# Patient Record
Sex: Male | Born: 1945 | Race: White | Hispanic: No | Marital: Married | Smoking: Former smoker
Health system: Southern US, Community
[De-identification: ages and names within clinical notes are randomized; demographics above are authoritative.]

## PROBLEM LIST (undated history)

## (undated) DIAGNOSIS — H9190 Unspecified hearing loss, unspecified ear: Secondary | ICD-10-CM

## (undated) DIAGNOSIS — I2510 Atherosclerotic heart disease of native coronary artery without angina pectoris: Secondary | ICD-10-CM

## (undated) DIAGNOSIS — Z8719 Personal history of other diseases of the digestive system: Secondary | ICD-10-CM

## (undated) DIAGNOSIS — I719 Aortic aneurysm of unspecified site, without rupture: Secondary | ICD-10-CM

## (undated) DIAGNOSIS — N189 Chronic kidney disease, unspecified: Secondary | ICD-10-CM

## (undated) DIAGNOSIS — K4090 Unilateral inguinal hernia, without obstruction or gangrene, not specified as recurrent: Secondary | ICD-10-CM

## (undated) DIAGNOSIS — R0600 Dyspnea, unspecified: Secondary | ICD-10-CM

## (undated) DIAGNOSIS — G629 Polyneuropathy, unspecified: Secondary | ICD-10-CM

## (undated) DIAGNOSIS — E669 Obesity, unspecified: Secondary | ICD-10-CM

## (undated) DIAGNOSIS — I10 Essential (primary) hypertension: Secondary | ICD-10-CM

## (undated) DIAGNOSIS — E119 Type 2 diabetes mellitus without complications: Secondary | ICD-10-CM

## (undated) DIAGNOSIS — E785 Hyperlipidemia, unspecified: Secondary | ICD-10-CM

## (undated) DIAGNOSIS — M1990 Unspecified osteoarthritis, unspecified site: Secondary | ICD-10-CM

## (undated) HISTORY — PX: JOINT REPLACEMENT: SHX530

## (undated) HISTORY — PX: NASAL SINUS SURGERY: SHX719

## (undated) HISTORY — DX: Essential (primary) hypertension: I10

## (undated) HISTORY — PX: TONSILLECTOMY: SUR1361

## (undated) HISTORY — DX: Obesity, unspecified: E66.9

## (undated) HISTORY — DX: Hyperlipidemia, unspecified: E78.5

## (undated) HISTORY — PX: UMBILICAL HERNIA REPAIR: SHX196

## (undated) HISTORY — PX: CORONARY ANGIOPLASTY: SHX604

## (undated) HISTORY — PX: HIATAL HERNIA REPAIR: SHX195

## (undated) HISTORY — PX: FRACTURE SURGERY: SHX138

## (undated) HISTORY — DX: Type 2 diabetes mellitus without complications: E11.9

---

## 2006-04-22 ENCOUNTER — Ambulatory Visit: Payer: Self-pay | Admitting: Gastroenterology

## 2007-12-31 ENCOUNTER — Ambulatory Visit: Payer: Self-pay

## 2008-09-04 IMAGING — CR DG KNEE COMPLETE 4+V*R*
1 series · 5 of 5 positions shown · non-contrast
Comparison: none

COMMENTS:

PROCEDURE:     DXR - DXR KNEE RT COMP WITH OBLIQUES  - December 31, 2007  [DATE]
RESULT:     Multiple views of the RIGHT knee reveal beaking of the tibial
spines. There are osteophytes noted from the medial margin of the RIGHT
tibial plateau. There are osteophytes noted from the superior and inferior
margins of the patella. There is no evidence of fracture and I see no joint
effusion.

[Series 1: view not recorded · 0.17mm/px · 5 of 5 slices shown]
[im 1/5]
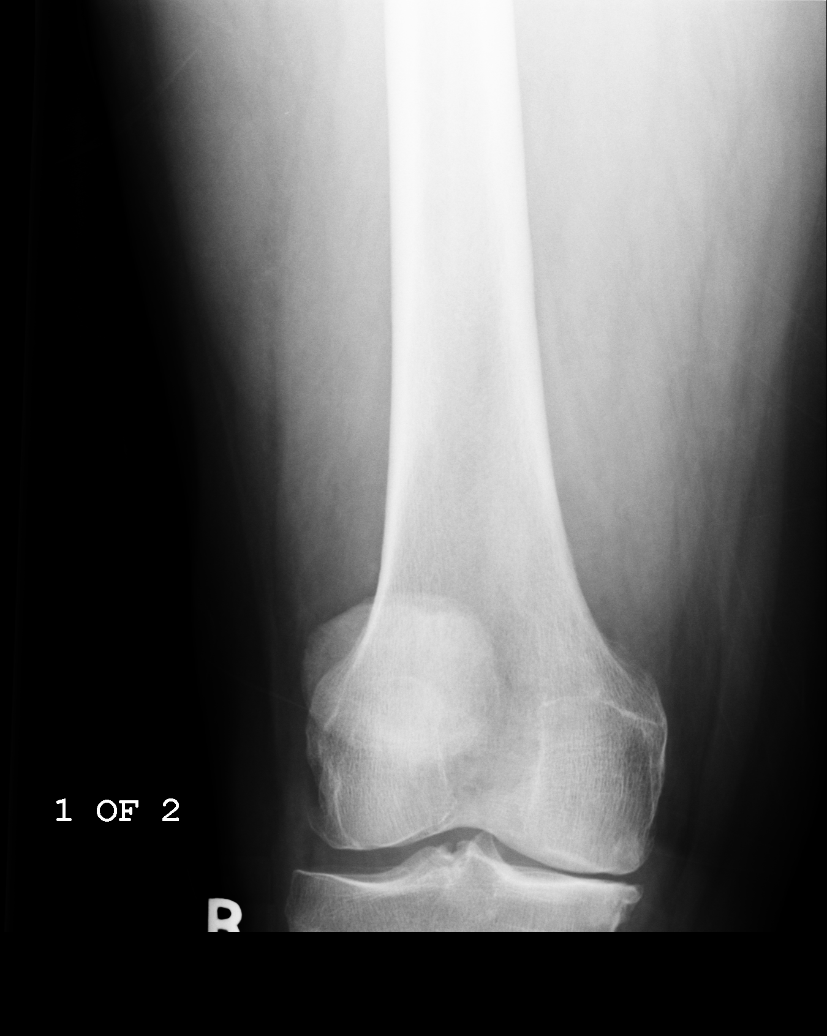
[im 2/5]
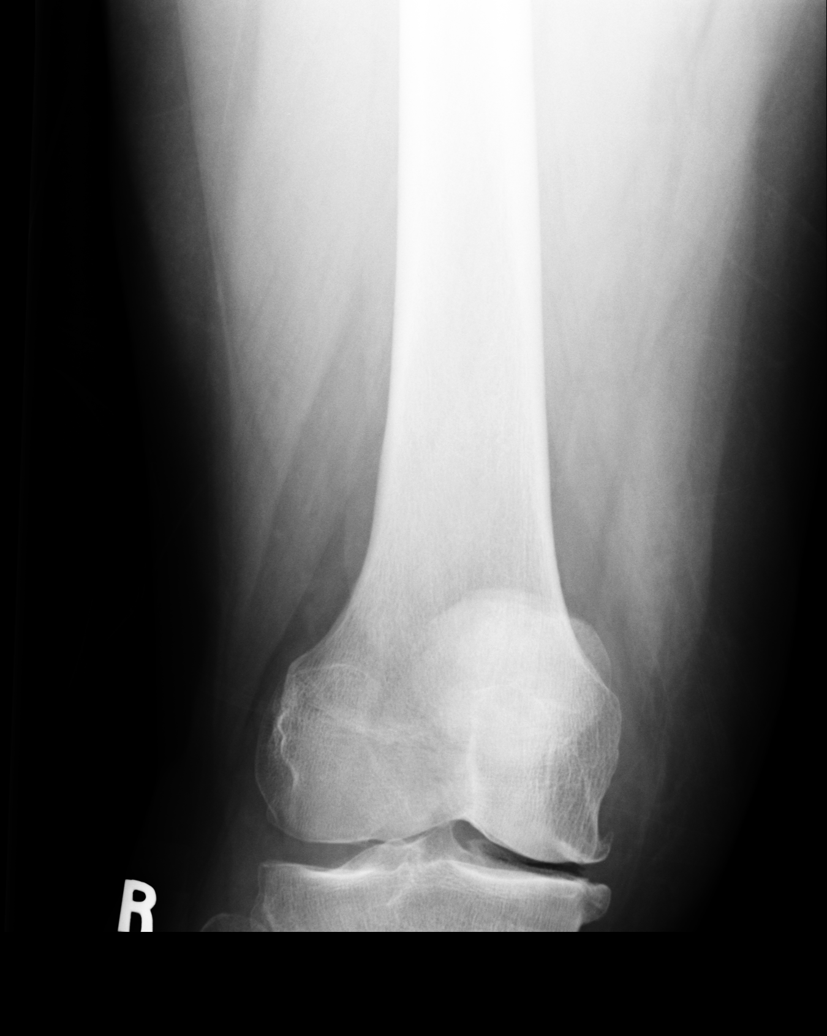
[im 3/5]
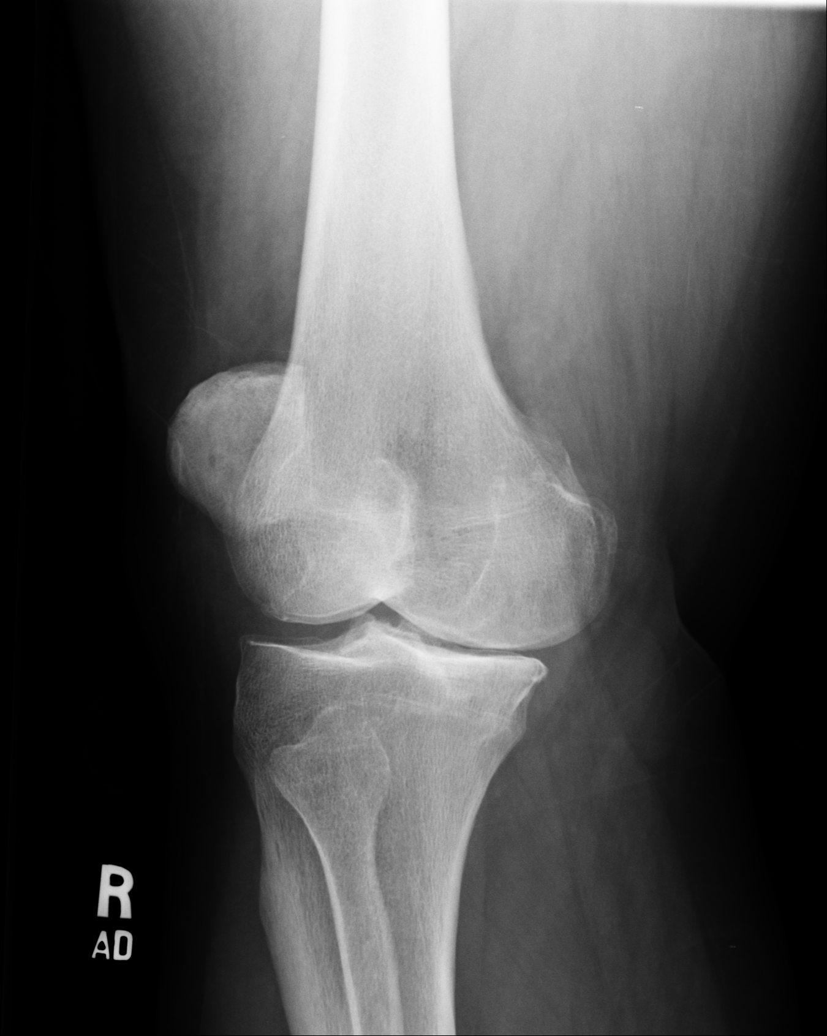
[im 4/5]
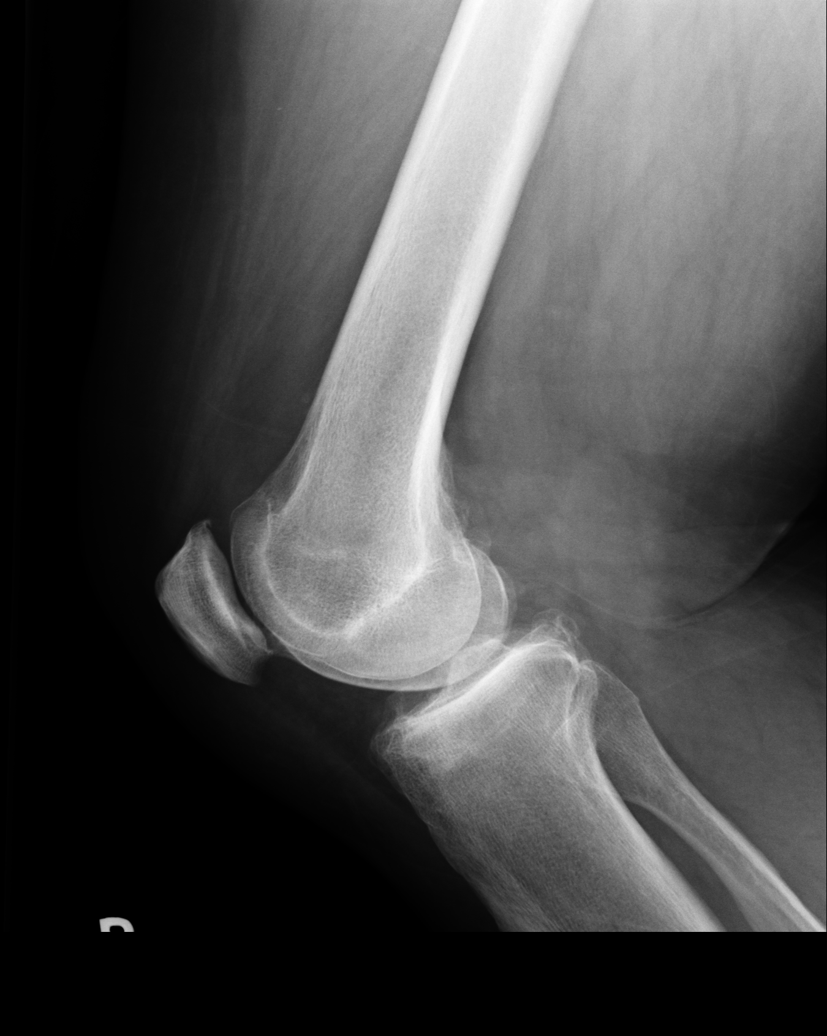
[im 5/5]
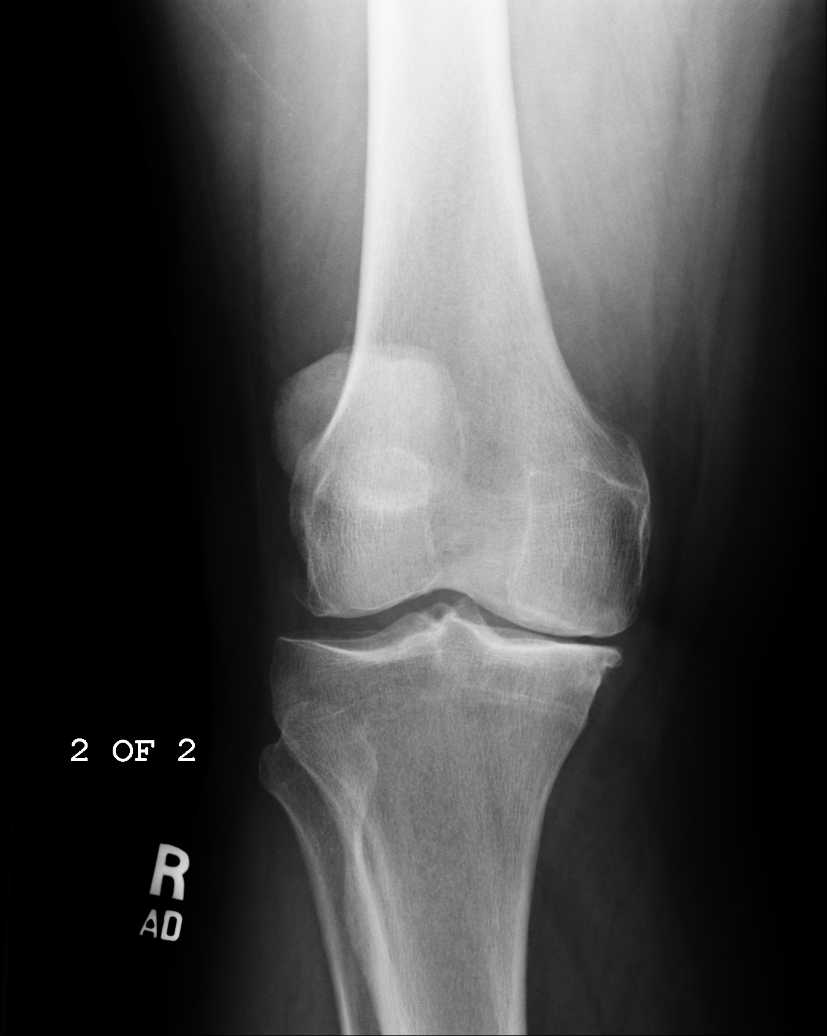

[5 of 5 positions shown; findings below may reference images not displayed]

IMPRESSION: There are degenerative changes which appear to involve all three joint
compartments of the knee. Further evaluation with MRI
may be useful.

## 2013-03-11 ENCOUNTER — Other Ambulatory Visit: Payer: Self-pay | Admitting: Family Medicine

## 2016-09-30 ENCOUNTER — Telehealth: Payer: Self-pay | Admitting: Gastroenterology

## 2016-09-30 NOTE — Telephone Encounter (Signed)
Left voice message for patient to call and schedule appointment with GI for Hemocult results 3/3 positive referred by [facility]

## 2016-10-14 ENCOUNTER — Ambulatory Visit: Payer: Self-pay | Admitting: Gastroenterology

## 2016-10-14 ENCOUNTER — Encounter: Payer: Self-pay | Admitting: Gastroenterology

## 2016-10-14 ENCOUNTER — Ambulatory Visit (INDEPENDENT_AMBULATORY_CARE_PROVIDER_SITE_OTHER): Payer: 59 | Admitting: Gastroenterology

## 2016-10-14 ENCOUNTER — Other Ambulatory Visit: Payer: Self-pay

## 2016-10-14 VITALS — BP 141/82 | HR 80 | Temp 98.0°F | Ht 68.0 in | Wt 269.0 lb

## 2016-10-14 DIAGNOSIS — R195 Other fecal abnormalities: Secondary | ICD-10-CM

## 2016-10-14 DIAGNOSIS — E669 Obesity, unspecified: Secondary | ICD-10-CM

## 2016-10-14 DIAGNOSIS — E119 Type 2 diabetes mellitus without complications: Secondary | ICD-10-CM

## 2016-10-14 DIAGNOSIS — E785 Hyperlipidemia, unspecified: Secondary | ICD-10-CM

## 2016-10-14 DIAGNOSIS — E118 Type 2 diabetes mellitus with unspecified complications: Secondary | ICD-10-CM

## 2016-10-14 DIAGNOSIS — I10 Essential (primary) hypertension: Secondary | ICD-10-CM

## 2016-10-14 HISTORY — DX: Hyperlipidemia, unspecified: E78.5

## 2016-10-14 HISTORY — DX: Type 2 diabetes mellitus without complications: E11.9

## 2016-10-14 HISTORY — DX: Obesity, unspecified: E66.9

## 2016-10-14 HISTORY — DX: Essential (primary) hypertension: I10

## 2016-10-14 NOTE — Progress Notes (Signed)
Gastroenterology Consultation  Referring Provider:     Dr. Elijio Miles Primary Care Physician:  No primary care provider on file. Primary Gastroenterologist:  Dr. Allen Norris     Reason for Consultation:     Heme-positive stools        HPI:   Aaron Valdez is a 71 y.o. y/o male referred for consultation & management of Heme-positive stools by Dr. Rayne Du primary care provider on file..  This patient comes today with a report of having 3 out of 3 stool cards being positive. The patient denies any abdominal pain nausea vomiting fevers or chills. The patient's last colonoscopy was approximately 10-1/2 years ago. The patient denies any unexplained weight loss. He states that he has been in his normal state of health without any complaints. He was told to follow-up with me because of his blood in his stools.  Past Medical History:  Diagnosis Date  . Diabetes mellitus ([place]) 10/14/2016  . Essential hypertension 10/14/2016  . Hyperlipidemia 10/14/2016  . Obesity 10/14/2016    Past Surgical History:  Procedure Laterality Date  . NASAL SINUS SURGERY    . TONSILLECTOMY    . UMBILICAL HERNIA REPAIR      Prior to Admission medications   Medication Sig Start Date End Date Taking? Authorizing Provider  INVOKANA 100 MG TABS tablet TK 1 T PO QAM 08/20/16  Yes Historical Provider, MD  JANUVIA 100 MG tablet  10/12/16  Yes Historical Provider, MD  ketoconazole (NIZORAL) 2 % cream APP EXT AA BID 07/15/16  Yes Historical Provider, MD  pioglitazone-metformin (ACTOPLUS MET) 15-850 MG tablet TK 1 T PO TID 08/06/16  Yes Historical Provider, MD  ramipril (ALTACE) 10 MG capsule  10/11/16  Yes Historical Provider, MD  rosuvastatin (CRESTOR) 20 MG tablet  10/10/16  Yes Historical Provider, MD    Family History  Problem Relation Age of Onset  . Heart disease Father   . AAA (abdominal aortic aneurysm) Father   . Cerebral aneurysm Brother      Social History  Substance Use Topics  . Smoking status: Former Research scientist (life sciences)  .  Smokeless tobacco: Never Used  . Alcohol use No    Allergies as of 10/14/2016  . (No Known Allergies)    Review of Systems:    All systems reviewed and negative except where noted in HPI.   Physical Exam:  BP (!) 141/82   Pulse 80   Temp 98 F (36.7 C) (Oral)   Ht '5\' 8"'$  (1.727 m)   Wt 269 lb (122 kg)   BMI 40.90 kg/m  No LMP for male patient. Psych:  Alert and cooperative. Normal mood and affect. General:   Alert,  Well-developed, well-nourished, pleasant and cooperative in NAD Head:  Normocephalic and atraumatic. Eyes:  Sclera clear, no icterus.   Conjunctiva pink. Ears:  Normal auditory acuity. Nose:  No deformity, discharge, or lesions. Mouth:  No deformity or lesions,oropharynx pink & moist. Neck:  Supple; no masses or thyromegaly. Lungs:  Respirations even and unlabored.  Clear throughout to auscultation.   No wheezes, crackles, or rhonchi. No acute distress. Heart:  Regular rate and rhythm; no murmurs, clicks, rubs, or gallops. Abdomen:  Normal bowel sounds.  No bruits.  Soft, non-tender and non-distended without masses, hepatosplenomegaly or hernias noted.  No guarding or rebound tenderness.  Negative Carnett sign.   Rectal:  Deferred.  Msk:  Symmetrical without gross deformities.  Good, equal movement & strength bilaterally. Pulses:  Normal pulses noted. Extremities:  No clubbing  or edema.  No cyanosis. Neurologic:  Alert and oriented x3;  grossly normal neurologically. Skin:  Intact without significant lesions or rashes.  No jaundice. Lymph Nodes:  No significant cervical adenopathy. Psych:  Alert and cooperative. Normal mood and affect.  Imaging Studies: No results found.  Assessment and Plan:   Aaron Valdez is a 71 y.o. y/o male who comes in today with a report of 3 Hemoccult cards being positive out of 3 he was given. The patient will be set up for a colonoscopy due to the heme positive stools. I have discussed risks & benefits which include, but are not  limited to, bleeding, infection, perforation & drug reaction.  The patient agrees with this plan & written consent will be obtained.       Lucilla Lame, MD. Marval Regal   Note: This dictation was prepared with Dragon dictation along with smaller phrase technology. Any transcriptional errors that result from this process are unintentional.

## 2016-10-15 ENCOUNTER — Telehealth: Payer: Self-pay | Admitting: Gastroenterology

## 2016-10-15 ENCOUNTER — Other Ambulatory Visit: Payer: Self-pay

## 2016-10-15 MED ORDER — NA SULFATE-K SULFATE-MG SULF 17.5-3.13-1.6 GM/177ML PO SOLN: 1.0000 | ORAL | 0 refills | Status: DC

## 2016-10-15 NOTE — Telephone Encounter (Signed)
[facility] on [facility] Needs colonoscopy prep called in today. Colonoscopy is the 25th

## 2016-10-15 NOTE — Telephone Encounter (Signed)
Rx for suprep sent to pt's pharmacy.

## 2016-10-16 NOTE — Discharge Instructions (Signed)

## 2016-10-17 ENCOUNTER — Encounter
Admission: RE | Disposition: A | Discharge status: Home / Self Care | Payer: Self-pay | Source: Ambulatory Visit | Attending: Gastroenterology

## 2016-10-17 ENCOUNTER — Ambulatory Visit: Payer: Medicare Other | Admitting: Anesthesiology

## 2016-10-17 ENCOUNTER — Ambulatory Visit
Admission: RE | Admit: 2016-10-17 | Discharge: 2016-10-17 | Disposition: A | Discharge status: Home / Self Care | Payer: Medicare Other | Source: Ambulatory Visit | Attending: Gastroenterology | Admitting: Gastroenterology

## 2016-10-17 DIAGNOSIS — Z9889 Other specified postprocedural states: Secondary | ICD-10-CM | POA: Insufficient documentation

## 2016-10-17 DIAGNOSIS — E119 Type 2 diabetes mellitus without complications: Secondary | ICD-10-CM | POA: Diagnosis not present

## 2016-10-17 DIAGNOSIS — Z7984 Long term (current) use of oral hypoglycemic drugs: Secondary | ICD-10-CM | POA: Insufficient documentation

## 2016-10-17 DIAGNOSIS — Z8249 Family history of ischemic heart disease and other diseases of the circulatory system: Secondary | ICD-10-CM | POA: Diagnosis not present

## 2016-10-17 DIAGNOSIS — K641 Second degree hemorrhoids: Secondary | ICD-10-CM | POA: Insufficient documentation

## 2016-10-17 DIAGNOSIS — Z79899 Other long term (current) drug therapy: Secondary | ICD-10-CM | POA: Diagnosis not present

## 2016-10-17 DIAGNOSIS — D120 Benign neoplasm of cecum: Secondary | ICD-10-CM

## 2016-10-17 DIAGNOSIS — E669 Obesity, unspecified: Secondary | ICD-10-CM | POA: Insufficient documentation

## 2016-10-17 DIAGNOSIS — I10 Essential (primary) hypertension: Secondary | ICD-10-CM | POA: Diagnosis not present

## 2016-10-17 DIAGNOSIS — Z7982 Long term (current) use of aspirin: Secondary | ICD-10-CM | POA: Insufficient documentation

## 2016-10-17 DIAGNOSIS — R195 Other fecal abnormalities: Secondary | ICD-10-CM

## 2016-10-17 DIAGNOSIS — Z87891 Personal history of nicotine dependence: Secondary | ICD-10-CM | POA: Insufficient documentation

## 2016-10-17 DIAGNOSIS — M1990 Unspecified osteoarthritis, unspecified site: Secondary | ICD-10-CM | POA: Diagnosis not present

## 2016-10-17 DIAGNOSIS — E785 Hyperlipidemia, unspecified: Secondary | ICD-10-CM | POA: Insufficient documentation

## 2016-10-17 HISTORY — DX: Unspecified osteoarthritis, unspecified site: M19.90

## 2016-10-17 HISTORY — PX: POLYPECTOMY: SHX5525

## 2016-10-17 HISTORY — PX: COLONOSCOPY WITH PROPOFOL: SHX5780

## 2016-10-17 HISTORY — DX: Dyspnea, unspecified: R06.00

## 2016-10-17 LAB — GLUCOSE, CAPILLARY
GLUCOSE-CAPILLARY: 131 mg/dL — AB (ref 65–99)
Glucose-Capillary: 141 mg/dL — ABNORMAL HIGH (ref 65–99)

## 2016-10-17 SURGERY — COLONOSCOPY WITH PROPOFOL
Anesthesia: Monitor Anesthesia Care | Wound class: Contaminated

## 2016-10-17 MED ORDER — LIDOCAINE HCL (CARDIAC) 20 MG/ML IV SOLN
INTRAVENOUS | Status: DC | PRN
  Administered 2016-10-17: 50 mg via INTRAVENOUS

## 2016-10-17 MED ORDER — STERILE WATER FOR IRRIGATION IR SOLN
Status: DC | PRN
  Administered 2016-10-17: 10:00:00

## 2016-10-17 MED ORDER — LACTATED RINGERS IV SOLN
INTRAVENOUS | Status: DC
  Administered 2016-10-17: 09:00:00 via INTRAVENOUS

## 2016-10-17 MED ORDER — PROPOFOL 10 MG/ML IV BOLUS
INTRAVENOUS | Status: DC | PRN
  Administered 2016-10-17: 40 mg via INTRAVENOUS
  Administered 2016-10-17: 50 mg via INTRAVENOUS
  Administered 2016-10-17: 10 mg via INTRAVENOUS
  Administered 2016-10-17: 100 mg via INTRAVENOUS
  Administered 2016-10-17: 20 mg via INTRAVENOUS

## 2016-10-17 SURGICAL SUPPLY — 23 items

## 2016-10-17 NOTE — H&P (Signed)
  Lucilla Lame, MD [facility] [address], [place], [place] [address] Phone: [phone] Fax : [phone]  Primary Care Physician:  Volanda Napoleon, MD Primary Gastroenterologist:  Dr. Allen Norris  Pre-Procedure History & Physical: HPI:  Aaron Valdez is a 71 y.o. male is here for an colonoscopy.   Past Medical History:  Diagnosis Date  . Arthritis    "ALL OVER"  . Diabetes mellitus ([place]) 10/14/2016  . Dyspnea    WITH EXERTION  . Essential hypertension 10/14/2016  . Hyperlipidemia 10/14/2016  . Obesity 10/14/2016    Past Surgical History:  Procedure Laterality Date  . NASAL SINUS SURGERY    . TONSILLECTOMY    . UMBILICAL HERNIA REPAIR      Prior to Admission medications   Medication Sig Start Date End Date Taking? Authorizing Provider  aspirin EC 81 MG tablet Take 81 mg by mouth daily.   Yes Historical Provider, MD  Multiple Vitamin (MULTIVITAMIN) tablet Take 1 tablet by mouth daily.   Yes Historical Provider, MD  INVOKANA 100 MG TABS tablet TK 1 T PO QAM 08/20/16   Historical Provider, MD  JANUVIA 100 MG tablet  10/12/16   Historical Provider, MD  ketoconazole (NIZORAL) 2 % cream APP EXT AA BID 07/15/16   Historical Provider, MD  Na Sulfate-K Sulfate-Mg Sulf (SUPREP BOWEL PREP KIT) 17.5-3.13-1.6 GM/180ML SOLN Take 1 kit by mouth as directed. 10/15/16   Lucilla Lame, MD  pioglitazone-metformin (ACTOPLUS MET) [phone] MG tablet TK 1 T PO TID 08/06/16   Historical Provider, MD  ramipril (ALTACE) 10 MG capsule  10/11/16   Historical Provider, MD  rosuvastatin (CRESTOR) 20 MG tablet  10/10/16   Historical Provider, MD    Allergies as of 10/14/2016  . (No Known Allergies)    Family History  Problem Relation Age of Onset  . Heart disease Father   . AAA (abdominal aortic aneurysm) Father   . Cerebral aneurysm Brother     Social History   Social History  . Marital status: Married    Spouse name: N/A  . Number of children: N/A  . Years of education: N/A    Occupational History  . Not on file.   Social History Main Topics  . Smoking status: Former Research scientist (life sciences)  . Smokeless tobacco: Never Used  . Alcohol use No  . Drug use: No  . Sexual activity: Not on file   Other Topics Concern  . Not on file   Social History Narrative  . No narrative on file    Review of Systems: See HPI, otherwise negative ROS  Physical Exam: Ht _0  (1.727 m)   Wt 270 lb (122.5 kg)   BMI 41.05 kg/m  General:   Alert,  pleasant and cooperative in NAD Head:  Normocephalic and atraumatic. Neck:  Supple; no masses or thyromegaly. Lungs:  Clear throughout to auscultation.    Heart:  Regular rate and rhythm. Abdomen:  Soft, nontender and nondistended. Normal bowel sounds, without guarding, and without rebound.   Neurologic:  Alert and  oriented x4;  grossly normal neurologically.  Impression/Plan: Aaron Valdez is here for an colonoscopy to be performed for heme positive stool.  Risks, benefits, limitations, and alternatives regarding  colonoscopy have been reviewed with the patient.  Questions have been answered.  All parties agreeable.   Lucilla Lame, MD  10/17/2016, 8:39 AM

## 2016-10-17 NOTE — Transfer of Care (Signed)
Immediate Anesthesia Transfer of Care Note  Patient: Aaron Valdez  Procedure(s) Performed: Procedure(s): COLONOSCOPY WITH PROPOFOL (N/A) POLYPECTOMY  Patient Location: PACU  Anesthesia Type: MAC  Level of Consciousness: awake, alert  and patient cooperative  Airway and Oxygen Therapy: Patient Spontanous Breathing and Patient connected to supplemental oxygen  Post-op Assessment: Post-op Vital signs reviewed, Patient's Cardiovascular Status Stable, Respiratory Function Stable, Patent Airway and No signs of Nausea or vomiting  Post-op Vital Signs: Reviewed and stable  Complications: No apparent anesthesia complications

## 2016-10-17 NOTE — Op Note (Signed)
[facility] Gastroenterology Patient Name: Aaron Valdez Procedure Date: 10/17/2016 9:34 AM MRN: [id number] Account #: [id number] Date of Birth: 1945-10-03 Admit Type: Outpatient Age: 71 Room: [facility] OR ROOM 01 Gender: Male Note Status: Finalized Procedure:            Colonoscopy Indications:          Heme positive stool Providers:            Lucilla Lame MD, MD Referring MD:         Venetia Maxon. Elijio Miles, MD (Referring MD) Medicines:            Propofol per Anesthesia Complications:        No immediate complications. Procedure:            Pre-Anesthesia Assessment:                       - Prior to the procedure, a History and Physical was                        performed, and patient medications and allergies were                        reviewed. The patient's tolerance of previous                        anesthesia was also reviewed. The risks and benefits of                        the procedure and the sedation options and risks were                        discussed with the patient. All questions were                        answered, and informed consent was obtained. Prior                        Anticoagulants: The patient has taken no previous                        anticoagulant or antiplatelet agents. ASA Grade                        Assessment: II - A patient with mild systemic disease.                        After reviewing the risks and benefits, the patient was                        deemed in satisfactory condition to undergo the                        procedure.                       After obtaining informed consent, the colonoscope was                        passed under direct vision. Throughout the procedure,  the patient's blood pressure, pulse, and oxygen                        saturations were monitored continuously. The Stephenson (S#: [id number]) was introduced through       the anus and advanced to the the cecum, identified by                        appendiceal orifice and ileocecal valve. The                        colonoscopy was performed without difficulty. The                        patient tolerated the procedure well. The quality of                        the bowel preparation was excellent. Findings:      The perianal and digital rectal examinations were normal.      A 6 mm polyp was found in the cecum. The polyp was sessile. The polyp       was removed with a cold snare. Resection and retrieval were complete.      Non-bleeding internal hemorrhoids were found during retroflexion. The       hemorrhoids were Grade II (internal hemorrhoids that prolapse but reduce       spontaneously). Impression:           - One 6 mm polyp in the cecum, removed with a cold                        snare. Resected and retrieved.                       - Non-bleeding internal hemorrhoids. Recommendation:       - Discharge patient to home.                       - Resume previous diet.                       - Continue present medications.                       - Await pathology results.                       - Repeat colonoscopy in 5 years if polyp adenoma and 10                        years if hyperplastic Procedure Code(s):    --- Professional ---                       [phone], Colonoscopy, flexible; with removal of tumor(s),                        polyp(s), or other lesion(s) by snare technique Diagnosis Code(s):    --- Professional ---  R19.5, Other fecal abnormalities                       D12.0, Benign neoplasm of cecum CPT copyright 2016 American Medical Association. All rights reserved. The codes documented in this report are preliminary and upon coder review may  be revised to meet current compliance requirements. Lucilla Lame MD, MD 10/17/2016 9:54:34 AM This report has been signed electronically. Number of Addenda: 0 Note Initiated On:  10/17/2016 9:34 AM Scope Withdrawal Time: 0 hours 8 minutes 17 seconds  Total Procedure Duration: 0 hours 9 minutes 42 seconds       [facility]

## 2016-10-17 NOTE — Anesthesia Procedure Notes (Signed)
Procedure Name: MAC Date/Time: 10/17/2016 9:36 AM Performed by: Cameron Ali Pre-anesthesia Checklist: Patient identified, Emergency Drugs available, Suction available, Timeout performed and Patient being monitored Patient Re-evaluated:Patient Re-evaluated prior to inductionOxygen Delivery Method: Nasal cannula Placement Confirmation: positive ETCO2

## 2016-10-17 NOTE — Anesthesia Preprocedure Evaluation (Signed)
Anesthesia Evaluation  Patient identified by MRN, date of birth, ID band Patient awake    Reviewed: Allergy & Precautions, H&P , NPO status , Patient's Chart, lab work & pertinent test results, reviewed documented beta blocker date and time   Airway Mallampati: II  TM Distance: >3 FB Neck ROM: full    Dental no notable dental hx.    Pulmonary former smoker,    Pulmonary exam normal breath sounds clear to auscultation       Cardiovascular Exercise Tolerance: Good hypertension,  Rhythm:regular Rate:Normal     Neuro/Psych negative neurological ROS  negative psych ROS   GI/Hepatic negative GI ROS, Neg liver ROS,   Endo/Other  diabetes  Renal/GU negative Renal ROS  negative genitourinary   Musculoskeletal   Abdominal   Peds  Hematology negative hematology ROS (+)   Anesthesia Other Findings   Reproductive/Obstetrics negative OB ROS                             Anesthesia Physical Anesthesia Plan  ASA: III  Anesthesia Plan: MAC   Post-op Pain Management:    Induction:   Airway Management Planned:   Additional Equipment:   Intra-op Plan:   Post-operative Plan:   Informed Consent: I have reviewed the patients History and Physical, chart, labs and discussed the procedure including the risks, benefits and alternatives for the proposed anesthesia with the patient or authorized representative who has indicated his/her understanding and acceptance.   Dental Advisory Given  Plan Discussed with: CRNA  Anesthesia Plan Comments:         Anesthesia Quick Evaluation

## 2016-10-17 NOTE — Anesthesia Postprocedure Evaluation (Signed)
Anesthesia Post Note  Patient: Aaron Valdez  Procedure(s) Performed: Procedure(s) (LRB): COLONOSCOPY WITH PROPOFOL (N/A) POLYPECTOMY  Patient location during evaluation: PACU Anesthesia Type: MAC Level of consciousness: awake and alert Pain management: pain level controlled Vital Signs Assessment: post-procedure vital signs reviewed and stable Respiratory status: spontaneous breathing, nonlabored ventilation, respiratory function stable and patient connected to nasal cannula oxygen Cardiovascular status: stable and blood pressure returned to baseline Anesthetic complications: no    Alisa Graff

## 2016-10-18 ENCOUNTER — Encounter: Payer: Self-pay | Admitting: Gastroenterology

## 2016-10-20 ENCOUNTER — Encounter: Payer: Self-pay | Admitting: Gastroenterology

## 2017-05-13 ENCOUNTER — Encounter: Payer: Self-pay | Admitting: *Deleted

## 2017-05-17 ENCOUNTER — Emergency Department: Payer: Medicare Other

## 2017-05-17 ENCOUNTER — Emergency Department
Admission: EM | Admit: 2017-05-17 | Discharge: 2017-05-17 | Disposition: A | Discharge status: Home / Self Care | Payer: Medicare Other | Attending: Emergency Medicine | Admitting: Emergency Medicine

## 2017-05-17 DIAGNOSIS — Z7982 Long term (current) use of aspirin: Secondary | ICD-10-CM | POA: Diagnosis not present

## 2017-05-17 DIAGNOSIS — S46211A Strain of muscle, fascia and tendon of other parts of biceps, right arm, initial encounter: Secondary | ICD-10-CM | POA: Insufficient documentation

## 2017-05-17 DIAGNOSIS — E119 Type 2 diabetes mellitus without complications: Secondary | ICD-10-CM | POA: Diagnosis not present

## 2017-05-17 DIAGNOSIS — Y93H2 Activity, gardening and landscaping: Secondary | ICD-10-CM | POA: Diagnosis not present

## 2017-05-17 DIAGNOSIS — I10 Essential (primary) hypertension: Secondary | ICD-10-CM | POA: Diagnosis not present

## 2017-05-17 DIAGNOSIS — X500XXA Overexertion from strenuous movement or load, initial encounter: Secondary | ICD-10-CM | POA: Diagnosis not present

## 2017-05-17 DIAGNOSIS — Y929 Unspecified place or not applicable: Secondary | ICD-10-CM | POA: Insufficient documentation

## 2017-05-17 DIAGNOSIS — Z79899 Other long term (current) drug therapy: Secondary | ICD-10-CM | POA: Insufficient documentation

## 2017-05-17 DIAGNOSIS — Y999 Unspecified external cause status: Secondary | ICD-10-CM | POA: Insufficient documentation

## 2017-05-17 DIAGNOSIS — Z7984 Long term (current) use of oral hypoglycemic drugs: Secondary | ICD-10-CM | POA: Diagnosis not present

## 2017-05-17 DIAGNOSIS — Z87891 Personal history of nicotine dependence: Secondary | ICD-10-CM | POA: Insufficient documentation

## 2017-05-17 DIAGNOSIS — S4991XA Unspecified injury of right shoulder and upper arm, initial encounter: Secondary | ICD-10-CM | POA: Diagnosis present

## 2017-05-17 NOTE — ED Notes (Signed)
Pt has a large bruise to his right upper arm. Pt stating that he picked up a large limb on Thursday and threw it. Pt stating that he felt some pain in his bicep but "thought nothing about it." Pt stating that his upper arm was a little swollen. Pt stating then the bruising began to show up. Pt's wife was concerned because bruising was moving down below elbow. Pt stating no blood thinners except for a baby aspirin. Pt has no difficulty with movement.

## 2017-05-17 NOTE — ED Triage Notes (Signed)
Patient reports he was performing yardwork on Thursday and began to notice right upper arm swelling. Pt does not remember any specific injury to area.  Patient reports bruising began yesterday.    Patient has significant bruising to right upper arm. Pt denies use of blood thinners. Patient takes 1 baby aspirin daily

## 2017-05-17 NOTE — ED Notes (Signed)

## 2017-05-17 NOTE — ED Notes (Signed)
Pt presents with deep purple bruising and swelling to right upper arm; says he was moving limbs a few days ago but does not recall a specific injury/event that would cause the symptoms

## 2017-05-17 NOTE — ED Provider Notes (Signed)
[facility] Emergency Department Provider Note  ____________________________________________  Time seen: Approximately 8:47 PM  I have reviewed the triage vital signs and the nursing notes.   HISTORY  Chief Complaint Arm Injury    HPI Aaron Valdez is a 71 y.o. male presenting to the emergency department with right upper extremity bruising. Patient states that 2 days ago, he was picking up a limb. Patient states that he threw limb and felt a momentary pain in his biceps. Patient states that bruising became apparent the next day. Patient states that bruising has increased and he became concerned. Patient states that he is not in any pain now. He takes a baby aspirin daily. Patient states that he is currently retired. Patient denies prolonged immobility, recent surgery or history of DVT.   Past Medical History:  Diagnosis Date  . Aortic aneurysm (HCC)    possible  . Arthritis    "ALL OVER"  . Diabetes mellitus ([place]) 10/14/2016  . Dyspnea    WITH EXERTION  . Essential hypertension 10/14/2016  . History of hiatal hernia   . HOH (hard of hearing)   . Hyperlipidemia 10/14/2016  . Inguinal hernia   . Obesity 10/14/2016    Patient Active Problem List   Diagnosis Date Noted  . Guaiac + stool   . Benign neoplasm of cecum   . Diabetes mellitus ([place]) 10/14/2016  . Essential hypertension 10/14/2016  . Hyperlipidemia 10/14/2016  . Obesity 10/14/2016    Past Surgical History:  Procedure Laterality Date  . COLONOSCOPY WITH PROPOFOL N/A 10/17/2016   Procedure: COLONOSCOPY WITH PROPOFOL;  Surgeon: Lucilla Lame, MD;  Location: [place];  Service: Endoscopy;  Laterality: N/A;  . CORONARY ANGIOPLASTY    . FRACTURE SURGERY Left    elbow  . HIATAL HERNIA REPAIR    . NASAL SINUS SURGERY    . POLYPECTOMY  10/17/2016   Procedure: POLYPECTOMY;  Surgeon: Lucilla Lame, MD;  Location: [place];  Service: Endoscopy;;  . TONSILLECTOMY    . UMBILICAL  HERNIA REPAIR      Prior to Admission medications   Medication Sig Start Date End Date Taking? Authorizing Provider  aspirin EC 81 MG tablet Take 81 mg by mouth daily.    [provider]  INVOKANA 100 MG TABS tablet TK 1 T PO QAM 08/20/16   [provider]  JANUVIA 100 MG tablet Take 100 mg by mouth daily.  10/12/16   [provider]  ketoconazole (NIZORAL) 2 % cream APP EXT AA BID 07/15/16   [provider]  Multiple Vitamin (MULTIVITAMIN) tablet Take 1 tablet by mouth daily.    [provider]  Na Sulfate-K Sulfate-Mg Sulf (SUPREP BOWEL PREP KIT) 17.5-3.13-1.6 GM/180ML SOLN Take 1 kit by mouth as directed. Patient not taking: Reported on 05/13/2017 10/15/16   Lucilla Lame, MD  pioglitazone-metformin (ACTOPLUS MET) [phone] MG tablet TK 1 T PO TID 08/06/16   [provider]  Probiotic Product (PROBIOTIC ACIDOPHILUS BEADS PO) Take 1 capsule by mouth daily.    [provider]  ramipril (ALTACE) 10 MG capsule Take 10 mg by mouth daily.  10/11/16   [provider]  rosuvastatin (CRESTOR) 20 MG tablet Take 20 mg by mouth daily.  10/10/16   [provider]  tizanidine (ZANAFLEX) 2 MG capsule Take 2 mg by mouth 3 (three) times daily as needed for muscle spasms.    [provider]    Allergies Patient has no known allergies.  Family  History  Problem Relation Age of Onset  . Heart disease Father   . AAA (abdominal aortic aneurysm) Father   . Cerebral aneurysm Brother     Social History Social History  Substance Use Topics  . Smoking status: Former Games developer  . Smokeless tobacco: Never Used  . Alcohol use No     Review of Systems  Constitutional: No fever/chills Eyes: No visual changes. No discharge ENT: No upper respiratory complaints. Cardiovascular: no chest pain. Respiratory: no cough. No SOB. Gastrointestinal: No abdominal pain.  No nausea, no vomiting.  No diarrhea.  No  constipation. Musculoskeletal: Negative for musculoskeletal pain. Skin: Patient has diffuse ecchymosis of the right upper extremity Neurological: Negative for headaches, focal weakness or numbness.   ____________________________________________   PHYSICAL EXAM:  VITAL SIGNS: ED Triage Vitals  Enc Vitals Group     BP 05/17/17 1915 (!) 152/68     Pulse Rate 05/17/17 1915 73     Resp 05/17/17 1915 20     Temp 05/17/17 1915 98.4 F (36.9 C)     Temp Source 05/17/17 1915 Oral     SpO2 05/17/17 1915 95 %     Weight 05/17/17 1916 264 lb (119.7 kg)     Height 05/17/17 1916 5\' 8"  (1.727 m)     Head Circumference --      Peak Flow --      Pain Score 05/17/17 1915 4     Pain Loc --      Pain Edu? --      Excl. in GC? --      Constitutional: Alert and oriented. Well appearing and in no acute distress. Eyes: Conjunctivae are normal. PERRL. EOMI. Head: Atraumatic. Cardiovascular: Normal rate, regular rhythm. Normal S1 and S2.  Good peripheral circulation. Respiratory: Normal respiratory effort without tachypnea or retractions. Lungs CTAB. Good air entry to the bases with no decreased or absent breath sounds. Musculoskeletal: Full range of motion to all extremities. No gross deformities appreciated. Palpable radial and ulnar pulses bilaterally and symmetrically. Neurologic:  Normal speech and language. No gross focal neurologic deficits are appreciated.  Skin: Patient has diffuse ecchymosis of the right upper extremity. Bruising is concentrated from shoulder to elbow Psychiatric: Mood and affect are normal. Speech and behavior are normal. Patient exhibits appropriate insight and judgement.   ____________________________________________   LABS (all labs ordered are listed, but only abnormal results are displayed)  Labs Reviewed - No data to display ____________________________________________  EKG   ____________________________________________  RADIOLOGY 05/19/17,  personally viewed and evaluated these images (plain radiographs) as part of my medical decision making, as well as reviewing the written report by the radiologist.   Dg Humerus Right  Result Date: 05/17/2017 CLINICAL DATA:  Right upper arm bruise EXAM: RIGHT HUMERUS - 2+ VIEW COMPARISON:  None. FINDINGS: No fracture or dislocation is seen. The joint spaces are preserved. The visualized soft tissues are unremarkable. Visualized right lung is clear. IMPRESSION: Negative. Electronically Signed   By: 05/19/2017 M.D.   On: 05/17/2017 19:54    ____________________________________________    PROCEDURES  Procedure(s) performed:    Procedures    Medications - No data to display   ____________________________________________   INITIAL IMPRESSION / ASSESSMENT AND PLAN / ED COURSE  Pertinent labs & imaging results that were available during my care of the patient were reviewed by me and considered in my medical decision making (see chart for details).  Review of the [place] CSRS was performed in  accordance of the [place] prior to dispensing any controlled drugs.     Assessment and plan Biceps tendon rupture Patient presents to the emergency department with diffuse bruising along the right upper extremity. History and physical exam findings are consistent with biceps tendon rupture. A referral was made to Dr. Sabra Heck. Patient was advised to use Tylenol as needed for pain as patient has an upcoming cataract surgery. Vital signs are reassuring at discharge. All patient questions were answered.   ____________________________________________  FINAL CLINICAL IMPRESSION(S) / ED DIAGNOSES  Final diagnoses:  Rupture of right biceps tendon, initial encounter      NEW MEDICATIONS STARTED DURING THIS VISIT:  New Prescriptions   No medications on file        This chart was dictated using voice recognition software/Dragon. Despite best efforts to proofread, errors can occur which can  change the meaning. Any change was purely unintentional.    Karren Cobble 05/17/17 2054    Hinda Kehr, MD 05/17/17 2238

## 2017-05-20 ENCOUNTER — Ambulatory Visit: Payer: Medicare Other | Admitting: Anesthesiology

## 2017-05-20 ENCOUNTER — Encounter
Admission: RE | Disposition: A | Discharge status: Home / Self Care | Payer: Self-pay | Source: Ambulatory Visit | Attending: Ophthalmology

## 2017-05-20 ENCOUNTER — Ambulatory Visit
Admission: RE | Admit: 2017-05-20 | Discharge: 2017-05-20 | Disposition: A | Discharge status: Home / Self Care | Payer: Medicare Other | Source: Ambulatory Visit | Attending: Ophthalmology | Admitting: Ophthalmology

## 2017-05-20 ENCOUNTER — Encounter: Payer: Self-pay | Admitting: *Deleted

## 2017-05-20 DIAGNOSIS — Z87891 Personal history of nicotine dependence: Secondary | ICD-10-CM | POA: Diagnosis not present

## 2017-05-20 DIAGNOSIS — E7800 Pure hypercholesterolemia, unspecified: Secondary | ICD-10-CM | POA: Insufficient documentation

## 2017-05-20 DIAGNOSIS — Z7982 Long term (current) use of aspirin: Secondary | ICD-10-CM | POA: Diagnosis not present

## 2017-05-20 DIAGNOSIS — Z79899 Other long term (current) drug therapy: Secondary | ICD-10-CM | POA: Insufficient documentation

## 2017-05-20 DIAGNOSIS — Z7984 Long term (current) use of oral hypoglycemic drugs: Secondary | ICD-10-CM | POA: Diagnosis not present

## 2017-05-20 DIAGNOSIS — I10 Essential (primary) hypertension: Secondary | ICD-10-CM | POA: Insufficient documentation

## 2017-05-20 DIAGNOSIS — H2511 Age-related nuclear cataract, right eye: Secondary | ICD-10-CM | POA: Insufficient documentation

## 2017-05-20 DIAGNOSIS — E119 Type 2 diabetes mellitus without complications: Secondary | ICD-10-CM | POA: Diagnosis not present

## 2017-05-20 HISTORY — DX: Personal history of other diseases of the digestive system: Z87.19

## 2017-05-20 HISTORY — DX: Unspecified hearing loss, unspecified ear: H91.90

## 2017-05-20 HISTORY — PX: CATARACT EXTRACTION W/PHACO: SHX586

## 2017-05-20 HISTORY — DX: Unilateral inguinal hernia, without obstruction or gangrene, not specified as recurrent: K40.90

## 2017-05-20 HISTORY — DX: Aortic aneurysm of unspecified site, without rupture: I71.9

## 2017-05-20 LAB — GLUCOSE, CAPILLARY: GLUCOSE-CAPILLARY: 136 mg/dL — AB (ref 65–99)

## 2017-05-20 SURGERY — PHACOEMULSIFICATION, CATARACT, WITH IOL INSERTION
Anesthesia: Monitor Anesthesia Care | Site: Eye | Laterality: Right | Wound class: Clean

## 2017-05-20 MED ORDER — LIDOCAINE HCL (PF) 4 % IJ SOLN
INTRAOCULAR | Status: DC | PRN
  Administered 2017-05-20: 2 mL via OPHTHALMIC

## 2017-05-20 MED ORDER — FENTANYL CITRATE (PF) 100 MCG/2ML IJ SOLN
INTRAMUSCULAR | Status: AC
  Filled 2017-05-20: qty 2

## 2017-05-20 MED ORDER — ARMC OPHTHALMIC DILATING DROPS
OPHTHALMIC | Status: AC
  Administered 2017-05-20: 1 via OPHTHALMIC
  Filled 2017-05-20: qty 0.4

## 2017-05-20 MED ORDER — ARMC OPHTHALMIC DILATING DROPS
1.0000 "application " | OPHTHALMIC | Status: AC
  Administered 2017-05-20 (×3): 1 via OPHTHALMIC

## 2017-05-20 MED ORDER — MOXIFLOXACIN HCL 0.5 % OP SOLN
OPHTHALMIC | Status: DC | PRN
  Administered 2017-05-20: .2 mL via OPHTHALMIC

## 2017-05-20 MED ORDER — MIDAZOLAM HCL 2 MG/2ML IJ SOLN
INTRAMUSCULAR | Status: AC
  Filled 2017-05-20: qty 2

## 2017-05-20 MED ORDER — SODIUM CHLORIDE 0.9 % IV SOLN
INTRAVENOUS | Status: DC
  Administered 2017-05-20: 07:00:00 via INTRAVENOUS

## 2017-05-20 MED ORDER — MOXIFLOXACIN HCL 0.5 % OP SOLN
OPHTHALMIC | Status: AC
  Filled 2017-05-20: qty 3

## 2017-05-20 MED ORDER — FENTANYL CITRATE (PF) 100 MCG/2ML IJ SOLN
INTRAMUSCULAR | Status: DC | PRN
  Administered 2017-05-20: 50 ug via INTRAVENOUS

## 2017-05-20 MED ORDER — MIDAZOLAM HCL 2 MG/2ML IJ SOLN
INTRAMUSCULAR | Status: DC | PRN
  Administered 2017-05-20 (×2): 1 mg via INTRAVENOUS

## 2017-05-20 MED ORDER — MOXIFLOXACIN HCL 0.5 % OP SOLN: 1.0000 [drp] | OPHTHALMIC | Status: DC | PRN

## 2017-05-20 MED ORDER — POVIDONE-IODINE 5 % OP SOLN
OPHTHALMIC | Status: AC
  Filled 2017-05-20: qty 30

## 2017-05-20 MED ORDER — CARBACHOL 0.01 % IO SOLN
INTRAOCULAR | Status: DC | PRN
  Administered 2017-05-20: .5 mL via INTRAOCULAR

## 2017-05-20 MED ORDER — NA CHONDROIT SULF-NA HYALURON 40-17 MG/ML IO SOLN
INTRAOCULAR | Status: DC | PRN
  Administered 2017-05-20: 1 mL via INTRAOCULAR

## 2017-05-20 MED ORDER — NA CHONDROIT SULF-NA HYALURON 40-17 MG/ML IO SOLN
INTRAOCULAR | Status: AC
  Filled 2017-05-20: qty 1

## 2017-05-20 MED ORDER — POVIDONE-IODINE 5 % OP SOLN
OPHTHALMIC | Status: DC | PRN
  Administered 2017-05-20: 1 via OPHTHALMIC

## 2017-05-20 MED ORDER — EPINEPHRINE PF 1 MG/ML IJ SOLN
INTRAMUSCULAR | Status: AC
  Filled 2017-05-20: qty 1

## 2017-05-20 MED ORDER — LIDOCAINE HCL (PF) 4 % IJ SOLN
INTRAMUSCULAR | Status: AC
  Filled 2017-05-20: qty 5

## 2017-05-20 MED ORDER — EPINEPHRINE PF 1 MG/ML IJ SOLN
INTRAMUSCULAR | Status: DC | PRN
  Administered 2017-05-20: 1 mL via OPHTHALMIC

## 2017-05-20 SURGICAL SUPPLY — 16 items
GLOVE BIO SURGEON STRL SZ8 (GLOVE) ×3 IMPLANT
GLOVE BIOGEL M 6.5 STRL (GLOVE) ×3 IMPLANT
GLOVE SURG LX 8.0 MICRO (GLOVE) ×2
GLOVE SURG LX STRL 8.0 MICRO (GLOVE) ×1 IMPLANT
GOWN STRL REUS W/ TWL LRG LVL3 (GOWN DISPOSABLE) ×2 IMPLANT
GOWN STRL REUS W/TWL LRG LVL3 (GOWN DISPOSABLE) ×4
LABEL CATARACT MEDS ST (LABEL) ×3 IMPLANT
LENS IOL TECNIS ITEC 22.5 (Intraocular Lens) ×3 IMPLANT
PACK CATARACT (MISCELLANEOUS) ×3 IMPLANT
PACK CATARACT BRASINGTON LX (MISCELLANEOUS) ×3 IMPLANT
PACK EYE AFTER SURG (MISCELLANEOUS) ×3 IMPLANT
SOL BSS BAG (MISCELLANEOUS) ×3
SOLUTION BSS BAG (MISCELLANEOUS) ×1 IMPLANT
SYR 5ML LL (SYRINGE) ×3 IMPLANT
WATER STERILE IRR 250ML POUR (IV SOLUTION) ×3 IMPLANT
WIPE NON LINTING 3.25X3.25 (MISCELLANEOUS) ×3 IMPLANT

## 2017-05-20 NOTE — Anesthesia Preprocedure Evaluation (Signed)
Anesthesia Evaluation  Patient identified by MRN, date of birth, ID band Patient awake    Reviewed: Allergy & Precautions, NPO status , Patient's Chart, lab work & pertinent test results  History of Anesthesia Complications Negative for: history of anesthetic complications  Airway Mallampati: II       Dental  (+) Missing, Chipped   Pulmonary neg COPD, former smoker,           Cardiovascular hypertension, Pt. on medications (-) Past MI and (-) CHF (-) dysrhythmias (-) Valvular Problems/Murmurs     Neuro/Psych neg Seizures    GI/Hepatic Neg liver ROS, hiatal hernia,   Endo/Other  diabetes, Type 2, Oral Hypoglycemic Agents  Renal/GU negative Renal ROS     Musculoskeletal   Abdominal   Peds  Hematology   Anesthesia Other Findings   Reproductive/Obstetrics                             Anesthesia Physical Anesthesia Plan  ASA: III  Anesthesia Plan: MAC   Post-op Pain Management:    Induction:   PONV Risk Score and Plan:   Airway Management Planned:   Additional Equipment:   Intra-op Plan:   Post-operative Plan:   Informed Consent: I have reviewed the patients History and Physical, chart, labs and discussed the procedure including the risks, benefits and alternatives for the proposed anesthesia with the patient or authorized representative who has indicated his/her understanding and acceptance.     Plan Discussed with:   Anesthesia Plan Comments:         Anesthesia Quick Evaluation

## 2017-05-20 NOTE — Anesthesia Postprocedure Evaluation (Signed)
Anesthesia Post Note  Patient: Aaron Valdez  Procedure(s) Performed: Procedure(s) (LRB): CATARACT EXTRACTION PHACO AND INTRAOCULAR LENS PLACEMENT (IOC) (Right)  Patient location during evaluation: PACU Anesthesia Type: MAC Level of consciousness: awake, awake and alert and oriented Pain management: pain level controlled Vital Signs Assessment: post-procedure vital signs reviewed and stable Respiratory status: spontaneous breathing Cardiovascular status: blood pressure returned to baseline Postop Assessment: no signs of nausea or vomiting and adequate PO intake Anesthetic complications: no     Last Vitals:  Vitals:   05/20/17 0811 05/20/17 0812  BP: 128/78 128/78  Pulse: 74 71  Resp: 16 12  Temp: (!) 36.3 C (!) 36.3 C  SpO2: 97% 96%    Last Pain:  Vitals:   05/20/17 0812  TempSrc: Temporal  PainSc:                  Hedda Slade

## 2017-05-20 NOTE — H&P (Signed)
All labs reviewed. Abnormal studies sent to patients PCP when indicated.  Previous H&P reviewed, patient examined, there are NO CHANGES.  Aaron Valdez LOUIS8/28/20187:46 AM

## 2017-05-20 NOTE — Anesthesia Post-op Follow-up Note (Signed)
Anesthesia QCDR form completed.        

## 2017-05-20 NOTE — Transfer of Care (Signed)
Immediate Anesthesia Transfer of Care Note  Patient: Aaron Valdez  Procedure(s) Performed: Procedure(s) with comments: CATARACT EXTRACTION PHACO AND INTRAOCULAR LENS PLACEMENT (IOC) (Right) - [place] 01:03 AP% 19.9 CDE 12.53 Fluid pack lot # [id number]  Patient Location: PACU  Anesthesia Type:MAC  Level of Consciousness: awake, alert  and oriented  Airway & Oxygen Therapy: Patient Spontanous Breathing  Post-op Assessment: Report given to RN and Post -op Vital signs reviewed and stable  Post vital signs: Reviewed and stable  Last Vitals:  Vitals:   05/20/17 0640 05/20/17 0812  BP: (!) 152/80 128/78  Pulse: 88 71  Resp: 20 12  Temp: (!) 36.1 C (!) 36.3 C  SpO2: 96% 96%    Last Pain:  Vitals:   05/20/17 0812  TempSrc: Temporal  PainSc:          Complications: No apparent anesthesia complications

## 2017-05-20 NOTE — Discharge Instructions (Signed)
Eye Surgery Discharge Instructions  Expect mild scratchy sensation or mild soreness. DO NOT RUB YOUR EYE!  The day of surgery:  Minimal physical activity, but bed rest is not required  No reading, computer work, or close hand work  No bending, lifting, or straining.  May watch TV  For 24 hours:  No driving, legal decisions, or alcoholic beverages  Safety precautions  Eat anything you prefer: It is better to start with liquids, then soup then solid foods.  _____ Eye patch should be worn until postoperative exam tomorrow.  _x___ Solar shield eyeglasses should be worn for comfort in the sunlight/patch while sleeping  Resume all regular medications including aspirin or Coumadin if these were discontinued prior to surgery. You may shower, bathe, shave, or wash your hair. Tylenol may be taken for mild discomfort.  Call your doctor if you experience significant pain, nausea, or vomiting, fever > 101 or other signs of infection. [phone] or [phone] Specific instructions:  Follow-up Information    Birder Robson, MD Follow up on 05/21/2017.   Specialty:  Ophthalmology Why:  follow up appointment at 9:35 am in the office Contact information: [place] [address] [phone]

## 2017-05-20 NOTE — Anesthesia Procedure Notes (Signed)
Procedure Name: MAC Date/Time: 05/20/2017 7:55 AM Performed by: Hedda Slade Pre-anesthesia Checklist: Patient identified, Emergency Drugs available, Suction available and Patient being monitored Oxygen Delivery Method: Nasal cannula

## 2017-05-20 NOTE — Op Note (Signed)
PREOPERATIVE DIAGNOSIS:  Nuclear sclerotic cataract of the right eye.   POSTOPERATIVE DIAGNOSIS:  nuclear sclerotic cataract right eye   OPERATIVE PROCEDURE: Procedure(s): CATARACT EXTRACTION PHACO AND INTRAOCULAR LENS PLACEMENT (IOC)   SURGEON:  Birder Robson, MD.   ANESTHESIA:  Anesthesiologist: Gunnar Fusi, MD CRNA: Hedda Slade, CRNA  1.      Managed anesthesia care. 2.      0.80ml of Shugarcaine was instilled in the eye following the paracentesis.   COMPLICATIONS:  None.   TECHNIQUE:   Stop and chop   DESCRIPTION OF PROCEDURE:  The patient was examined and consented in the preoperative holding area where the aforementioned topical anesthesia was applied to the right eye and then brought back to the Operating Room where the right eye was prepped and draped in the usual sterile ophthalmic fashion and a lid speculum was placed. A paracentesis was created with the side port blade and the anterior chamber was filled with viscoelastic. A near clear corneal incision was performed with the steel keratome. A continuous curvilinear capsulorrhexis was performed with a cystotome followed by the capsulorrhexis forceps. Hydrodissection and hydrodelineation were carried out with BSS on a blunt cannula. The lens was removed in a stop and chop  technique and the remaining cortical material was removed with the irrigation-aspiration handpiece. The capsular bag was inflated with viscoelastic and the Technis [id number]  lens was placed in the capsular bag without complication. The remaining viscoelastic was removed from the eye with the irrigation-aspiration handpiece. The wounds were hydrated. The anterior chamber was flushed with Miostat and the eye was inflated to physiologic pressure. 0.65ml of Vigamox was placed in the anterior chamber. The wounds were found to be water tight. The eye was dressed with Vigamox. The patient was given protective glasses to wear throughout the day and a shield with which  to sleep tonight. The patient was also given drops with which to begin a drop regimen today and will follow-up with me in one day.  Implant Name Type Inv. Item Serial No. Manufacturer Lot No. LRB No. Used  LENS IOL DIOP 22.5 - [id number] Intraocular Lens LENS IOL DIOP 22.5 [id number] AMO   Right 1   Procedure(s) with comments: CATARACT EXTRACTION PHACO AND INTRAOCULAR LENS PLACEMENT (IOC) (Right) - [place] 01:03 AP% 19.9 CDE 12.53 Fluid pack lot # [id number]  Electronically signed: [place] 05/20/2017 8:10 AM

## 2017-05-21 ENCOUNTER — Encounter: Payer: Self-pay | Admitting: Ophthalmology

## 2018-01-20 IMAGING — CR DG HUMERUS 2V *R*
1 series · 4 of 4 positions shown · non-contrast
Comparison: None.

CLINICAL DATA: Right upper arm bruise

EXAM:
RIGHT HUMERUS - 2+ VIEW

[Series 1: dg humerus right · 0.14mm/px · 4 of 4 slices shown]
[im 1/4]
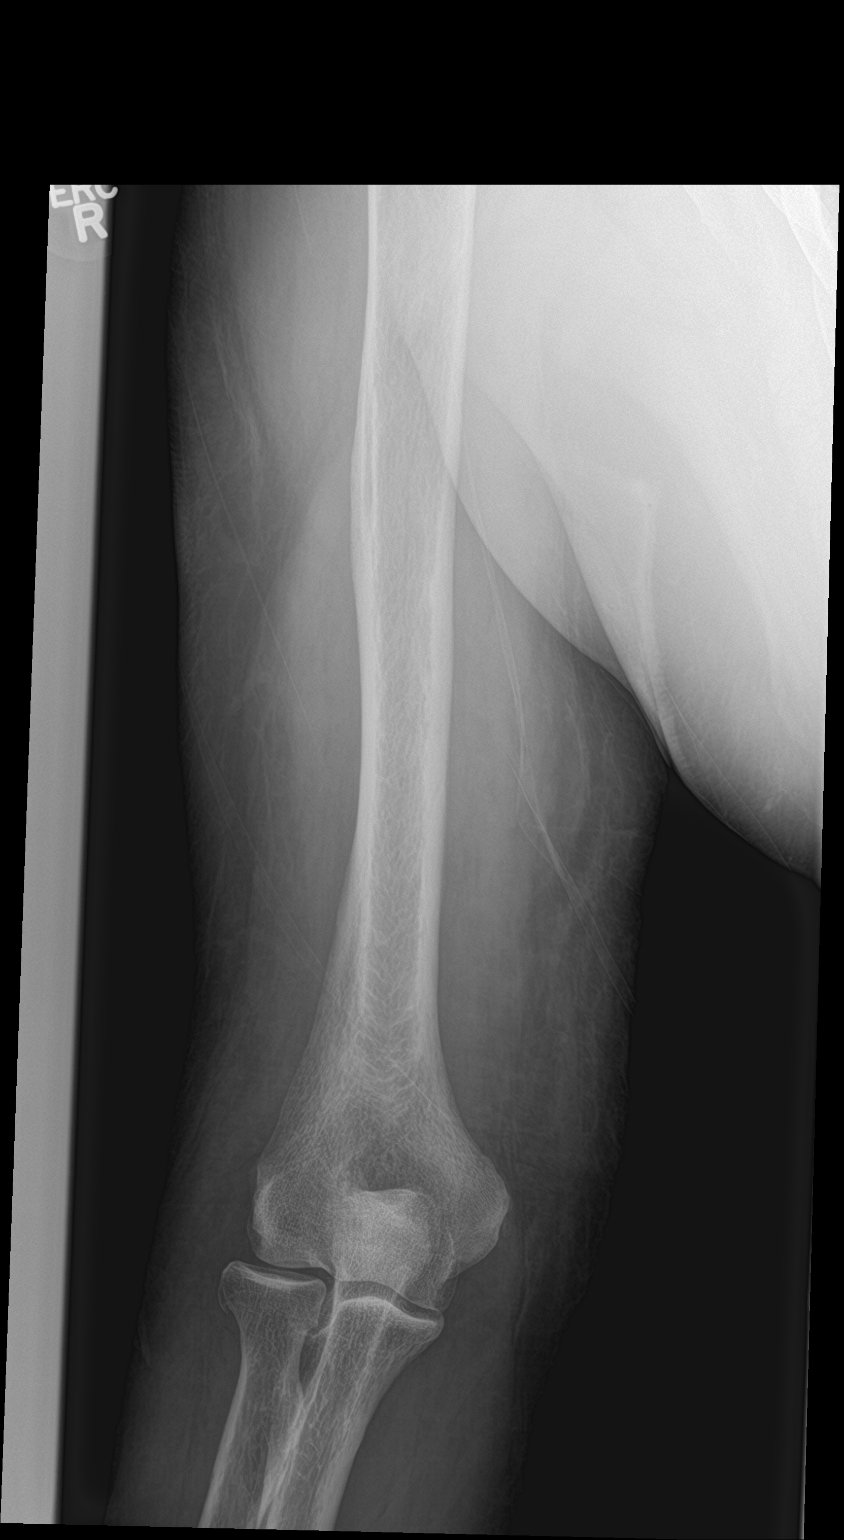
[im 2/4]
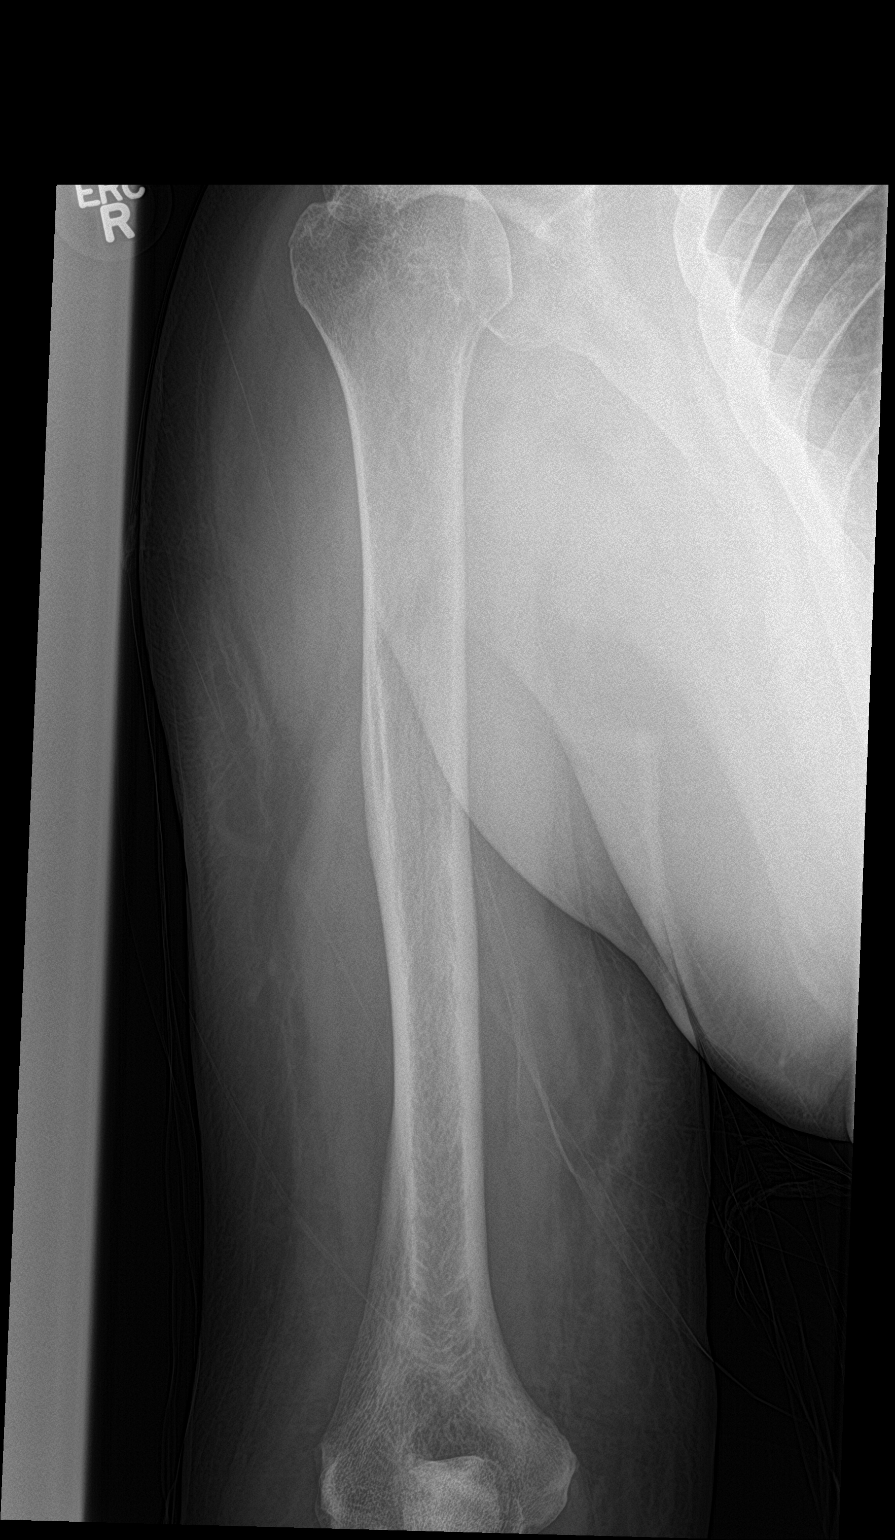
[im 3/4]
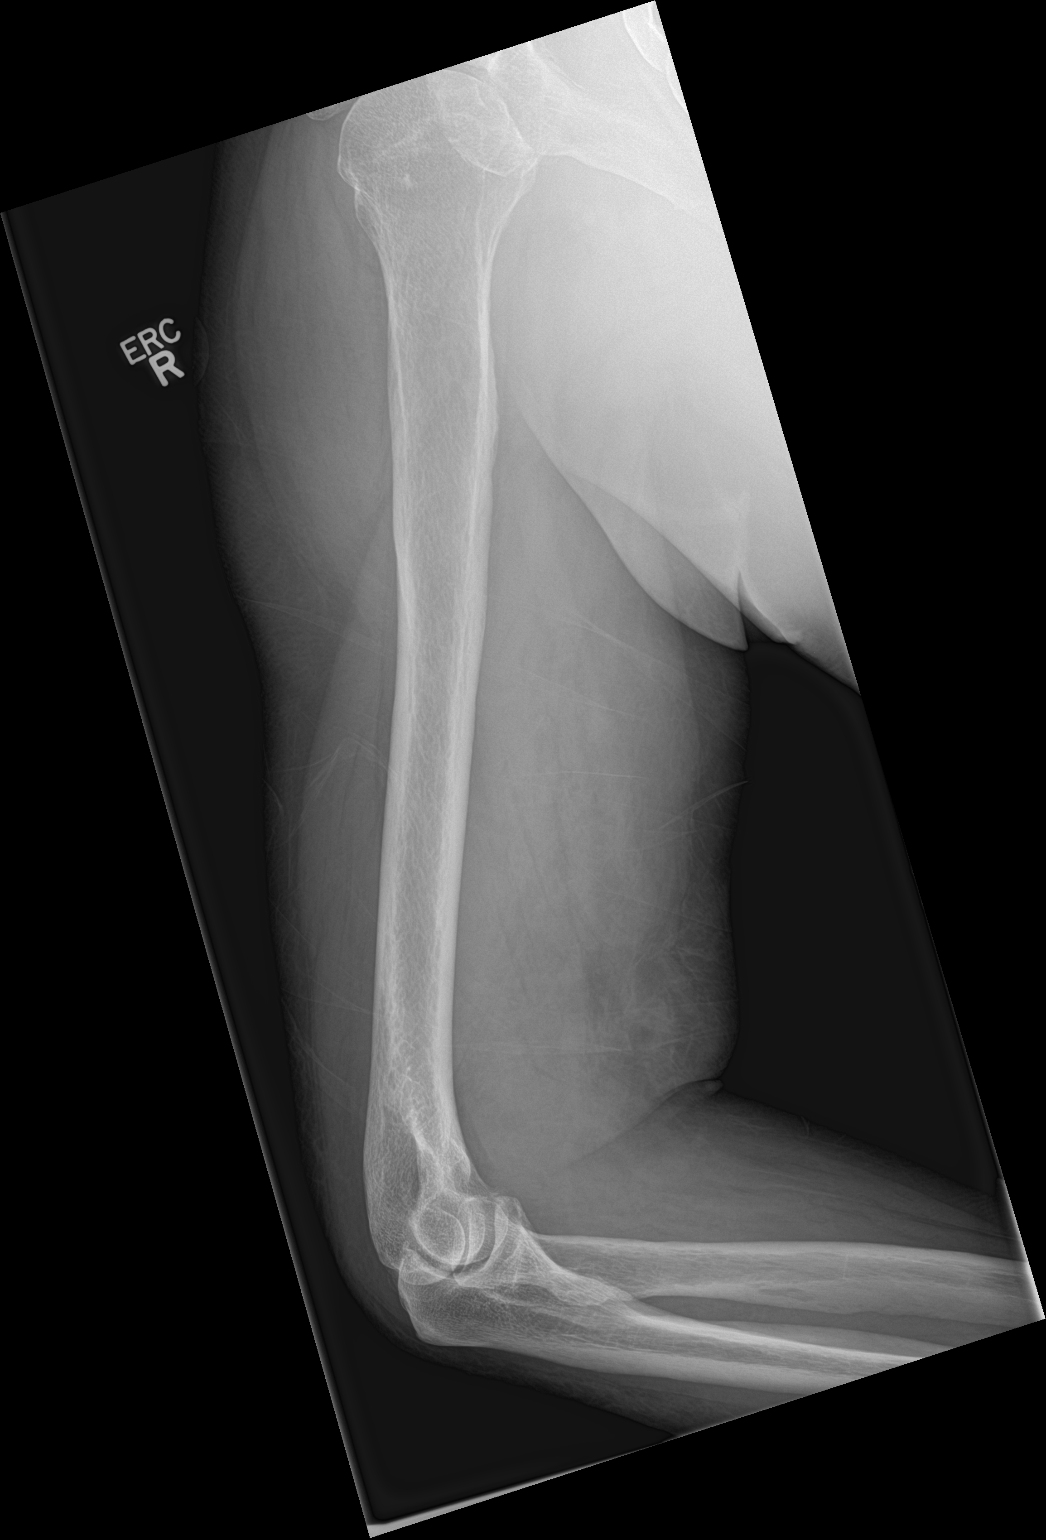
[im 4/4]
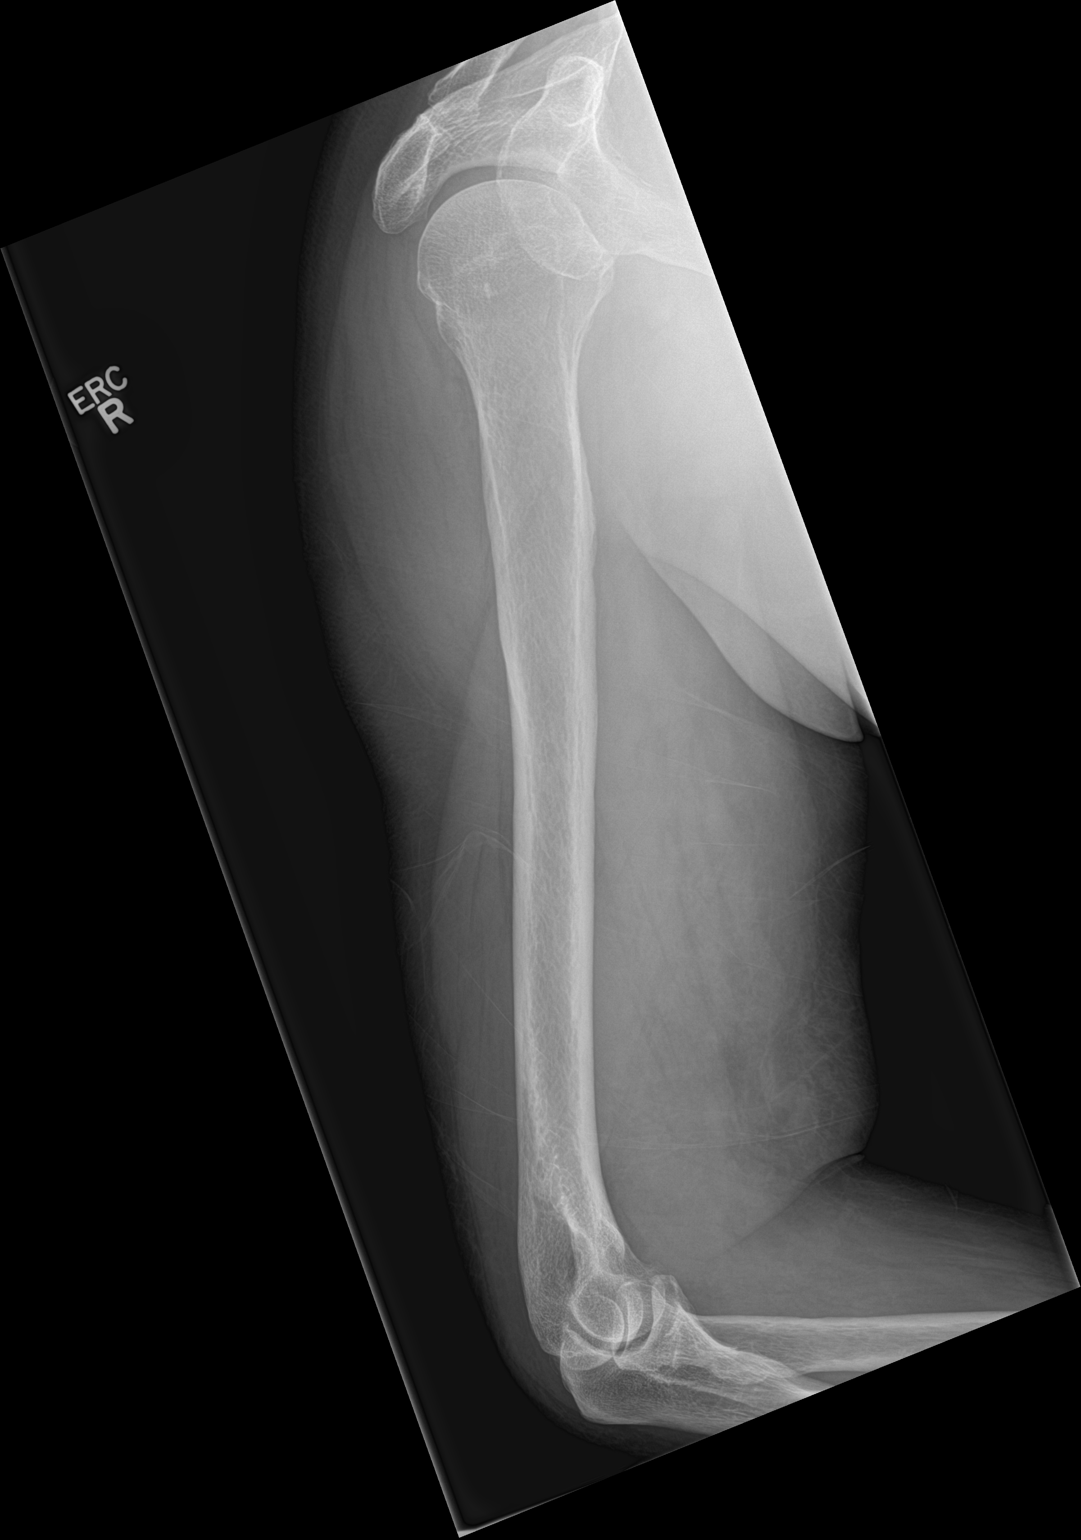

[4 of 4 positions shown; findings below may reference images not displayed]

FINDINGS: No fracture or dislocation is seen.

The joint spaces are preserved.

The visualized soft tissues are unremarkable.

Visualized right lung is clear.
IMPRESSION: Negative.

## 2018-08-26 ENCOUNTER — Encounter: Payer: Self-pay | Admitting: *Deleted

## 2018-09-01 ENCOUNTER — Ambulatory Visit
Admission: RE | Admit: 2018-09-01 | Discharge: 2018-09-01 | Disposition: A | Discharge status: Home / Self Care | Payer: Medicare Other | Source: Ambulatory Visit | Attending: Ophthalmology | Admitting: Ophthalmology

## 2018-09-01 ENCOUNTER — Other Ambulatory Visit: Payer: Self-pay

## 2018-09-01 ENCOUNTER — Encounter: Payer: Self-pay | Admitting: Certified Registered"

## 2018-09-01 ENCOUNTER — Encounter: Payer: Self-pay | Admitting: *Deleted

## 2018-09-01 ENCOUNTER — Encounter
Admission: RE | Disposition: A | Discharge status: Home / Self Care | Payer: Self-pay | Source: Ambulatory Visit | Attending: Ophthalmology

## 2018-09-01 DIAGNOSIS — H2512 Age-related nuclear cataract, left eye: Secondary | ICD-10-CM | POA: Diagnosis not present

## 2018-09-01 DIAGNOSIS — Z87891 Personal history of nicotine dependence: Secondary | ICD-10-CM | POA: Diagnosis not present

## 2018-09-01 DIAGNOSIS — Z7982 Long term (current) use of aspirin: Secondary | ICD-10-CM | POA: Insufficient documentation

## 2018-09-01 DIAGNOSIS — E1140 Type 2 diabetes mellitus with diabetic neuropathy, unspecified: Secondary | ICD-10-CM | POA: Diagnosis not present

## 2018-09-01 DIAGNOSIS — I10 Essential (primary) hypertension: Secondary | ICD-10-CM | POA: Insufficient documentation

## 2018-09-01 DIAGNOSIS — Z79899 Other long term (current) drug therapy: Secondary | ICD-10-CM | POA: Insufficient documentation

## 2018-09-01 DIAGNOSIS — E7800 Pure hypercholesterolemia, unspecified: Secondary | ICD-10-CM | POA: Insufficient documentation

## 2018-09-01 DIAGNOSIS — Z7984 Long term (current) use of oral hypoglycemic drugs: Secondary | ICD-10-CM | POA: Insufficient documentation

## 2018-09-01 HISTORY — DX: Polyneuropathy, unspecified: G62.9

## 2018-09-01 HISTORY — PX: CATARACT EXTRACTION W/PHACO: SHX586

## 2018-09-01 HISTORY — DX: Atherosclerotic heart disease of native coronary artery without angina pectoris: I25.10

## 2018-09-01 HISTORY — DX: Chronic kidney disease, unspecified: N18.9

## 2018-09-01 LAB — GLUCOSE, CAPILLARY: Glucose-Capillary: 169 mg/dL — ABNORMAL HIGH (ref 70–99)

## 2018-09-01 SURGERY — PHACOEMULSIFICATION, CATARACT, WITH IOL INSERTION
Anesthesia: Topical | Site: Eye | Laterality: Left

## 2018-09-01 MED ORDER — LIDOCAINE HCL (PF) 4 % IJ SOLN
INTRAMUSCULAR | Status: AC
  Filled 2018-09-01: qty 5

## 2018-09-01 MED ORDER — CARBACHOL 0.01 % IO SOLN
INTRAOCULAR | Status: DC | PRN
  Administered 2018-09-01: 0.5 mL via INTRAOCULAR

## 2018-09-01 MED ORDER — MIDAZOLAM HCL 2 MG/2ML IJ SOLN
INTRAMUSCULAR | Status: AC
  Filled 2018-09-01: qty 2

## 2018-09-01 MED ORDER — EPINEPHRINE PF 1 MG/ML IJ SOLN
INTRAMUSCULAR | Status: AC
  Filled 2018-09-01: qty 2

## 2018-09-01 MED ORDER — TETRACAINE HCL 0.5 % OP SOLN
OPHTHALMIC | Status: AC
  Filled 2018-09-01: qty 4

## 2018-09-01 MED ORDER — SODIUM CHLORIDE 0.9 % IV SOLN: INTRAVENOUS | Status: DC

## 2018-09-01 MED ORDER — NA CHONDROIT SULF-NA HYALURON 40-17 MG/ML IO SOLN
INTRAOCULAR | Status: DC | PRN
  Administered 2018-09-01: 1 mL via INTRAOCULAR

## 2018-09-01 MED ORDER — MOXIFLOXACIN HCL 0.5 % OP SOLN
OPHTHALMIC | Status: AC
  Filled 2018-09-01: qty 3

## 2018-09-01 MED ORDER — MOXIFLOXACIN HCL 0.5 % OP SOLN: 1.0000 [drp] | OPHTHALMIC | Status: DC | PRN

## 2018-09-01 MED ORDER — POVIDONE-IODINE 5 % OP SOLN
OPHTHALMIC | Status: DC | PRN
  Administered 2018-09-01: 1 via OPHTHALMIC

## 2018-09-01 MED ORDER — MOXIFLOXACIN HCL 0.5 % OP SOLN
OPHTHALMIC | Status: DC | PRN
  Administered 2018-09-01: 0.2 mL via OPHTHALMIC

## 2018-09-01 MED ORDER — EPINEPHRINE PF 1 MG/ML IJ SOLN
INTRAOCULAR | Status: DC | PRN
  Administered 2018-09-01: 08:00:00 via OPHTHALMIC

## 2018-09-01 MED ORDER — POVIDONE-IODINE 5 % OP SOLN
OPHTHALMIC | Status: AC
  Filled 2018-09-01: qty 30

## 2018-09-01 MED ORDER — NA CHONDROIT SULF-NA HYALURON 40-17 MG/ML IO SOLN
INTRAOCULAR | Status: AC
  Filled 2018-09-01: qty 1

## 2018-09-01 MED ORDER — TETRACAINE HCL 0.5 % OP SOLN
1.0000 [drp] | OPHTHALMIC | Status: AC | PRN
  Administered 2018-09-01 (×3): 1 [drp] via OPHTHALMIC

## 2018-09-01 MED ORDER — ARMC OPHTHALMIC DILATING DROPS
1.0000 "application " | OPHTHALMIC | Status: AC
  Administered 2018-09-01 (×3): 1 via OPHTHALMIC

## 2018-09-01 MED ORDER — LIDOCAINE HCL (PF) 4 % IJ SOLN
INTRAOCULAR | Status: DC | PRN
  Administered 2018-09-01: 4 mL via OPHTHALMIC

## 2018-09-01 MED ORDER — ARMC OPHTHALMIC DILATING DROPS
OPHTHALMIC | Status: AC
  Filled 2018-09-01: qty 0.5

## 2018-09-01 MED ORDER — FENTANYL CITRATE (PF) 100 MCG/2ML IJ SOLN
INTRAMUSCULAR | Status: AC
  Filled 2018-09-01: qty 2

## 2018-09-01 SURGICAL SUPPLY — 16 items
GLOVE BIO SURGEON STRL SZ8 (GLOVE) ×3 IMPLANT
GLOVE BIOGEL M 6.5 STRL (GLOVE) ×3 IMPLANT
GLOVE SURG LX 8.0 MICRO (GLOVE) ×2
GLOVE SURG LX STRL 8.0 MICRO (GLOVE) ×1 IMPLANT
GOWN STRL REUS W/ TWL LRG LVL3 (GOWN DISPOSABLE) ×2 IMPLANT
GOWN STRL REUS W/TWL LRG LVL3 (GOWN DISPOSABLE) ×4
LABEL CATARACT MEDS ST (LABEL) ×3 IMPLANT
LENS IOL TECNIS ITEC 22.5 (Intraocular Lens) ×2 IMPLANT
PACK CATARACT (MISCELLANEOUS) ×3 IMPLANT
PACK CATARACT BRASINGTON LX (MISCELLANEOUS) ×3 IMPLANT
PACK EYE AFTER SURG (MISCELLANEOUS) ×3 IMPLANT
SOL BSS BAG (MISCELLANEOUS) ×3
SOLUTION BSS BAG (MISCELLANEOUS) ×1 IMPLANT
SYR 5ML LL (SYRINGE) ×3 IMPLANT
WATER STERILE IRR 250ML POUR (IV SOLUTION) ×3 IMPLANT
WIPE NON LINTING 3.25X3.25 (MISCELLANEOUS) ×3 IMPLANT

## 2018-09-01 NOTE — Op Note (Signed)
PREOPERATIVE DIAGNOSIS:  Nuclear sclerotic cataract of the left eye.   POSTOPERATIVE DIAGNOSIS:  Nuclear sclerotic cataract of the left eye.   OPERATIVE PROCEDURE: Procedure(s): CATARACT EXTRACTION PHACO AND INTRAOCULAR LENS PLACEMENT (IOC)   SURGEON:  Birder Robson, MD.   ANESTHESIA:  No anesthesia staff entered.  1.      Managed anesthesia care. 2.     0.52ml of Shugarcaine was instilled following the paracentesis   COMPLICATIONS:  None.   TECHNIQUE:   Stop and chop   DESCRIPTION OF PROCEDURE:  The patient was examined and consented in the preoperative holding area where the aforementioned topical anesthesia was applied to the left eye and then brought back to the Operating Room where the left eye was prepped and draped in the usual sterile ophthalmic fashion and a lid speculum was placed. A paracentesis was created with the side port blade and the anterior chamber was filled with viscoelastic. A near clear corneal incision was performed with the steel keratome. A continuous curvilinear capsulorrhexis was performed with a cystotome followed by the capsulorrhexis forceps. Hydrodissection and hydrodelineation were carried out with BSS on a blunt cannula. The lens was removed in a stop and chop  technique and the remaining cortical material was removed with the irrigation-aspiration handpiece. The capsular bag was inflated with viscoelastic and the Technis ZCB00 lens was placed in the capsular bag without complication. The remaining viscoelastic was removed from the eye with the irrigation-aspiration handpiece. The wounds were hydrated. The anterior chamber was flushed with Miostat and the eye was inflated to physiologic pressure. 0.42ml Vigamox was placed in the anterior chamber. The wounds were found to be water tight. The eye was dressed with Vigamox. The patient was given protective glasses to wear throughout the day and a shield with which to sleep tonight. The patient was also given drops  with which to begin a drop regimen today and will follow-up with me in one day. Implant Name Type Inv. Item Serial No. Manufacturer Lot No. LRB No. Used  LENS IOL DIOP 22.5 - [id number] Intraocular Lens LENS IOL DIOP 22.5 [id number] AMO  Left 1    Procedure(s) with comments: CATARACT EXTRACTION PHACO AND INTRAOCULAR LENS PLACEMENT (IOC) (Left) - [place] 01:22.3 CDE 13.36 Fluid pack Lot # [id number]  Electronically signed: Birder Robson 09/01/2018 8:38 AM

## 2018-09-01 NOTE — H&P (Signed)
All labs reviewed. Abnormal studies sent to patients PCP when indicated.  Previous H&P reviewed, patient examined, there are NO CHANGES.  Aaron Rhines Porfilio12/10/20198:08 AM

## 2018-09-01 NOTE — Anesthesia Post-op Follow-up Note (Signed)
Anesthesia QCDR form completed.        

## 2018-09-01 NOTE — Discharge Instructions (Signed)
Eye Surgery Discharge Instructions    Expect mild scratchy sensation or mild soreness. DO NOT RUB YOUR EYE!  The day of surgery:  Minimal physical activity, but bed rest is not required  No reading, computer work, or close hand work  No bending, lifting, or straining.  May watch TV  For 24 hours:  No driving, legal decisions, or alcoholic beverages  Safety precautions  Eat anything you prefer: It is better to start with liquids, then soup then solid foods.  _____ Eye patch should be worn until postoperative exam tomorrow.  ____ Solar shield eyeglasses should be worn for comfort in the sunlight/patch while sleeping  Resume all regular medications including aspirin or Coumadin if these were discontinued prior to surgery. You may shower, bathe, shave, or wash your hair. Tylenol may be taken for mild discomfort.  Call your doctor if you experience significant pain, nausea, or vomiting, fever > 101 or other signs of infection. [phone] or [phone] Specific instructions:  Follow-up Information    Birder Robson, MD Follow up on 09/02/2018.   Specialty:  Ophthalmology Why:  @ 11:00 am Contact information: [address] [place]  [address] [phone]

## 2018-09-02 ENCOUNTER — Encounter: Payer: Self-pay | Admitting: Ophthalmology

## 2018-09-21 ENCOUNTER — Encounter: Payer: Self-pay | Admitting: Internal Medicine

## 2018-11-17 ENCOUNTER — Encounter: Payer: Self-pay | Admitting: Gastroenterology

## 2018-11-17 ENCOUNTER — Ambulatory Visit: Payer: Medicare Other | Admitting: Gastroenterology

## 2018-11-17 VITALS — BP 139/74 | HR 91 | Ht 68.0 in | Wt 273.6 lb

## 2018-11-17 DIAGNOSIS — K921 Melena: Secondary | ICD-10-CM | POA: Diagnosis not present

## 2018-11-17 NOTE — Progress Notes (Signed)
Primary Care Physician: Jodi Marble, MD  Primary Gastroenterologist:  Dr. Lucilla Lame  Chief Complaint  Patient presents with  . Heme positive stools    HPI: Aaron Valdez is a 73 y.o. male here after being found to have heme positive stools.  The patient had heme positive stools in the past and underwent a colonoscopy with a sessile serrated adenoma found at that time.  The patient already is  in a surveillance/screening program and I am unsure why Hemoccult cards were again sent off again.  The patient was also found to have internal hemorrhoids at the time of colonoscopy.  The patient is on an 81 mg of aspirin per day.   Current Outpatient Medications  Medication Sig Dispense Refill  . aspirin EC 81 MG tablet Take 81 mg by mouth daily.    . Carboxymethylcellulose Sodium (LUBRICANT EYE DROPS OP) Place 2 drops into both eyes daily as needed (for dry eyes).    . INVOKANA 100 MG TABS tablet Take 100 mg by mouth daily before breakfast.   0  . JANUVIA 100 MG tablet Take 100 mg by mouth daily.     Marland Kitchen ketoconazole (NIZORAL) 2 % cream Apply 1 application topically 2 (two) times daily as needed for irritation.   1  . Multiple Vitamin (MULTIVITAMIN) tablet Take 1 tablet by mouth daily.    . pioglitazone-metformin (ACTOPLUS MET) 15-850 MG tablet Take 1 tablet by mouth 3 (three) times daily.   0  . Probiotic Product (PROBIOTIC ACIDOPHILUS BEADS PO) Take 1 capsule by mouth daily.    . ramipril (ALTACE) 10 MG capsule Take 10 mg by mouth daily.     . rosuvastatin (CRESTOR) 10 MG tablet Take 10 mg by mouth daily.     . tizanidine (ZANAFLEX) 2 MG capsule Take 2 mg by mouth 3 (three) times daily as needed for muscle spasms.     No current facility-administered medications for this visit.     Allergies as of 11/17/2018  . (No Known Allergies)    ROS:  General: Negative for anorexia, weight loss, fever, chills, fatigue, weakness. ENT: Negative for hoarseness, difficulty swallowing ,  nasal congestion. CV: Negative for chest pain, angina, palpitations, dyspnea on exertion, peripheral edema.  Respiratory: Negative for dyspnea at rest, dyspnea on exertion, cough, sputum, wheezing.  GI: See history of present illness. GU:  Negative for dysuria, hematuria, urinary incontinence, urinary frequency, nocturnal urination.  Endo: Negative for unusual weight change.    Physical Examination:   BP 139/74   Pulse 91   Ht '5\' 8"'$  (1.727 m)   Wt 273 lb 9.6 oz (124.1 kg)   BMI 41.60 kg/m   General: Well-nourished, well-developed in no acute distress.  Eyes: No icterus. Conjunctivae pink. Mouth: Oropharyngeal mucosa moist and pink , no lesions erythema or exudate. Lungs: Clear to auscultation bilaterally. Non-labored. Heart: Regular rate and rhythm, no murmurs rubs or gallops.  Abdomen: Bowel sounds are normal, nontender, nondistended, no hepatosplenomegaly or masses, no abdominal bruits or hernia , no rebound or guarding.   Extremities: No lower extremity edema. No clubbing or deformities. Neuro: Alert and oriented x 3.  Grossly intact. Skin: Warm and dry, no jaundice.   Psych: Alert and cooperative, normal mood and affect.  Labs:    Imaging Studies: No results found.  Assessment and Plan:   Aaron Valdez is a 73 y.o. y/o male who comes in with a history of heme positive stools.  The patient has already had  a colonoscopy with his previous heme positive stools and hemorrhoids.  The patient is not due for another colonoscopy for next year.  Hemoccult cards should be used as a screening modality and since this patient has a history of colon polyps is already on a surveillance program with his next colonoscopy due next year.  The patient does not have any upper GI symptoms such as black stools or hematemesis.  He also denies early satiety or any other symptoms of gastric pathology.  Therefore no further work-up is needed at this time and he will have his next colonoscopy in a  year.    Lucilla Lame, MD. Marval Regal   Note: This dictation was prepared with Dragon dictation along with smaller phrase technology. Any transcriptional errors that result from this process are unintentional.

## 2019-10-06 DIAGNOSIS — E1122 Type 2 diabetes mellitus with diabetic chronic kidney disease: Secondary | ICD-10-CM | POA: Insufficient documentation

## 2020-11-20 ENCOUNTER — Other Ambulatory Visit: Payer: Self-pay

## 2020-11-20 ENCOUNTER — Other Ambulatory Visit
Admission: RE | Admit: 2020-11-20 | Discharge: 2020-11-20 | Disposition: A | Discharge status: Home / Self Care | Payer: Medicare Other | Source: Ambulatory Visit | Attending: Internal Medicine | Admitting: Internal Medicine

## 2020-11-20 DIAGNOSIS — Z01812 Encounter for preprocedural laboratory examination: Secondary | ICD-10-CM | POA: Diagnosis present

## 2020-11-20 DIAGNOSIS — Z20822 Contact with and (suspected) exposure to covid-19: Secondary | ICD-10-CM | POA: Insufficient documentation

## 2020-11-20 LAB — SARS CORONAVIRUS 2 (TAT 6-24 HRS): SARS Coronavirus 2: NEGATIVE

## 2020-11-21 ENCOUNTER — Encounter: Payer: Self-pay | Admitting: Internal Medicine

## 2020-11-22 ENCOUNTER — Ambulatory Visit
Admission: RE | Admit: 2020-11-22 | Discharge: 2020-11-22 | Disposition: A | Discharge status: Home / Self Care | Payer: Medicare Other | Attending: Internal Medicine | Admitting: Internal Medicine

## 2020-11-22 ENCOUNTER — Ambulatory Visit: Payer: Medicare Other | Admitting: Certified Registered"

## 2020-11-22 ENCOUNTER — Other Ambulatory Visit: Payer: Self-pay

## 2020-11-22 ENCOUNTER — Encounter: Payer: Self-pay | Admitting: Internal Medicine

## 2020-11-22 ENCOUNTER — Encounter
Admission: RE | Disposition: A | Discharge status: Home / Self Care | Payer: Self-pay | Source: Home / Self Care | Attending: Internal Medicine

## 2020-11-22 DIAGNOSIS — K621 Rectal polyp: Secondary | ICD-10-CM | POA: Insufficient documentation

## 2020-11-22 DIAGNOSIS — Z87891 Personal history of nicotine dependence: Secondary | ICD-10-CM | POA: Diagnosis not present

## 2020-11-22 DIAGNOSIS — R195 Other fecal abnormalities: Secondary | ICD-10-CM | POA: Diagnosis not present

## 2020-11-22 DIAGNOSIS — N189 Chronic kidney disease, unspecified: Secondary | ICD-10-CM | POA: Insufficient documentation

## 2020-11-22 DIAGNOSIS — I129 Hypertensive chronic kidney disease with stage 1 through stage 4 chronic kidney disease, or unspecified chronic kidney disease: Secondary | ICD-10-CM | POA: Diagnosis not present

## 2020-11-22 DIAGNOSIS — Z79899 Other long term (current) drug therapy: Secondary | ICD-10-CM | POA: Diagnosis not present

## 2020-11-22 DIAGNOSIS — Z7982 Long term (current) use of aspirin: Secondary | ICD-10-CM | POA: Insufficient documentation

## 2020-11-22 DIAGNOSIS — Z8719 Personal history of other diseases of the digestive system: Secondary | ICD-10-CM | POA: Insufficient documentation

## 2020-11-22 DIAGNOSIS — E1122 Type 2 diabetes mellitus with diabetic chronic kidney disease: Secondary | ICD-10-CM | POA: Diagnosis not present

## 2020-11-22 DIAGNOSIS — Z7984 Long term (current) use of oral hypoglycemic drugs: Secondary | ICD-10-CM | POA: Diagnosis not present

## 2020-11-22 DIAGNOSIS — K591 Functional diarrhea: Secondary | ICD-10-CM | POA: Insufficient documentation

## 2020-11-22 DIAGNOSIS — K640 First degree hemorrhoids: Secondary | ICD-10-CM | POA: Diagnosis not present

## 2020-11-22 HISTORY — PX: COLONOSCOPY: SHX5424

## 2020-11-22 LAB — GLUCOSE, CAPILLARY: Glucose-Capillary: 170 mg/dL — ABNORMAL HIGH (ref 70–99)

## 2020-11-22 SURGERY — COLONOSCOPY
Anesthesia: General

## 2020-11-22 MED ORDER — PROPOFOL 500 MG/50ML IV EMUL
INTRAVENOUS | Status: DC | PRN
  Administered 2020-11-22: 150 ug/kg/min via INTRAVENOUS

## 2020-11-22 MED ORDER — SODIUM CHLORIDE 0.9 % IV SOLN: INTRAVENOUS | Status: DC

## 2020-11-22 MED ORDER — PROPOFOL 10 MG/ML IV BOLUS
INTRAVENOUS | Status: DC | PRN
  Administered 2020-11-22: 50 mg via INTRAVENOUS

## 2020-11-22 NOTE — H&P (Signed)
Outpatient short stay form Pre-procedure 11/22/2020 10:30 AM Aaron K. Alice Reichert, M.D.  Primary Physician: Volanda Napoleon, M.D.  Reason for visit:  Functional diarrhea, hemoccult positive stool, personal history of colon polyps  History of present illness:  75 y/o male has history of adenomatous colon polyp x1 on colonoscopy in 2018 (Dr. Allen Norris). In the interim, he has several month history of loose stools and recently was found to be heme positive on occult blood testing of the stool.  Patient denies intractable heartburn, dysphagia, hemetemesis, abdominal pain, nausea or vomiting.      Current Facility-Administered Medications:  .  0.9 %  sodium chloride infusion, , Intravenous, Continuous, Coward, Benay Pike, MD, Last Rate: 20 mL/hr at 11/22/20 1028, Continued from Pre-op at 11/22/20 1028  Medications Prior to Admission  Medication Sig Dispense Refill Last Dose  . acetaminophen (TYLENOL) 500 MG tablet Take 500 mg by mouth every 6 (six) hours as needed.   Past Month at Unknown time  . aspirin EC 81 MG tablet Take 81 mg by mouth daily.   Past Week at Unknown time  . Carboxymethylcellulose Sodium (LUBRICANT EYE DROPS OP) Place 2 drops into both eyes daily as needed (for dry eyes).   Past Week at Unknown time  . dapagliflozin propanediol (FARXIGA) 10 MG TABS tablet Take by mouth daily.   Past Week at Unknown time  . hydrocortisone (ANUSOL-HC) 2.5 % rectal cream Place 1 application rectally 2 (two) times daily.   Past Week at Unknown time  . INVOKANA 100 MG TABS tablet Take 100 mg by mouth daily before breakfast.   0 Past Week at Unknown time  . ketoconazole (NIZORAL) 2 % cream Apply 1 application topically 2 (two) times daily as needed for irritation.   1 Past Week at Unknown time  . Lactobacillus (ACIDOPHILUS/PECTIN) 100 MG CAPS Take 10 mg by mouth.   Past Week at Unknown time  . Multiple Vitamin (MULTIVITAMIN) tablet Take 1 tablet by mouth daily.   Past Week at Unknown time  .  pioglitazone-metformin (ACTOPLUS MET) 15-850 MG tablet Take 1 tablet by mouth 3 (three) times daily.   0 Past Week at Unknown time  . Probiotic Product (PROBIOTIC ACIDOPHILUS BEADS PO) Take 1 capsule by mouth daily.   Past Week at Unknown time  . ramipril (ALTACE) 10 MG capsule Take 10 mg by mouth daily.    Past Week at Unknown time  . rosuvastatin (CRESTOR) 10 MG tablet Take 10 mg by mouth daily.    Past Week at Unknown time  . Semaglutide, 1 MG/DOSE, (OZEMPIC, 1 MG/DOSE,) 4 MG/3ML SOPN Inject 1 mg into the skin once a week.   Past Week at Unknown time  . tizanidine (ZANAFLEX) 2 MG capsule Take 2 mg by mouth 3 (three) times daily as needed for muscle spasms.   Past Week at Unknown time  . VITAMIN D, CHOLECALCIFEROL, PO Take 1,000 Units by mouth.   Past Week at Unknown time  . JANUVIA 100 MG tablet Take 100 mg by mouth daily.  (Patient not taking: Reported on 11/22/2020)   Not Taking at Unknown time     No Known Allergies   Past Medical History:  Diagnosis Date  . Aortic aneurysm (HCC)    possible  . Arthritis    "ALL OVER"  . Chronic kidney disease    RENAL INSUFF  . Coronary artery disease   . Diabetes mellitus ([place]) 10/14/2016  . Dyspnea    WITH EXERTION  . Essential hypertension  10/14/2016  . History of hiatal hernia   . HOH (hard of hearing)   . Hyperlipidemia 10/14/2016  . Inguinal hernia   . Neuropathy   . Obesity 10/14/2016    Review of systems:  Otherwise negative.    Physical Exam  Gen: Alert, oriented. Appears stated age.  HEENT: [place]/AT. PERRLA. Lungs: CTA, no wheezes. CV: RR nl S1, S2. Abd: soft, benign, no masses. BS+ Ext: No edema. Pulses 2+    Planned procedures: Proceed with colonoscopy. The patient understands the nature of the planned procedure, indications, risks, alternatives and potential complications including but not limited to bleeding, infection, perforation, damage to internal organs and possible oversedation/side effects from anesthesia. The  patient agrees and gives consent to proceed.  Please refer to procedure notes for findings, recommendations and patient disposition/instructions.     Aaron K. Alice Reichert, M.D. Gastroenterology 11/22/2020  10:30 AM

## 2020-11-22 NOTE — Op Note (Signed)
[facility] Gastroenterology Patient Name: Aaron Valdez Procedure Date: 11/22/2020 10:10 AM MRN: [id number] Account #: [id number] Date of Birth: [birth year] Admit Type: Outpatient Age: 75 Room: [facility] ENDO ROOM 2 Gender: Male Note Status: Finalized Procedure:             Colonoscopy Indications:           Functional diarrhea, Heme positive stool Providers:             Benay Pike. Alice Reichert MD, MD Referring MD:          Venetia Maxon. Elijio Miles, MD (Referring MD) Medicines:             Propofol per Anesthesia Complications:         No immediate complications. Procedure:             Pre-Anesthesia Assessment:                        - The risks and benefits of the procedure and the                         sedation options and risks were discussed with the                         patient. All questions were answered and informed                         consent was obtained.                        - Patient identification and proposed procedure were                         verified prior to the procedure by the nurse. The                         procedure was verified in the procedure room.                        - ASA Grade Assessment: III - A patient with severe                         systemic disease.                        - After reviewing the risks and benefits, the patient                         was deemed in satisfactory condition to undergo the                         procedure.                        After obtaining informed consent, the colonoscope was                         passed under direct vision. Throughout the procedure,                         the patient's blood pressure,  pulse, and oxygen                         saturations were monitored continuously. The                         Colonoscope was introduced through the anus and                         advanced to the the cecum, identified by appendiceal                         orifice and ileocecal valve.  The colonoscopy was                         performed without difficulty. The patient tolerated                         the procedure well. The quality of the bowel                         preparation was good. The ileocecal valve, appendiceal                         orifice, and rectum were photographed. Findings:      The perianal and digital rectal examinations were normal. Pertinent       negatives include normal sphincter tone and no palpable rectal lesions.      Non-bleeding internal hemorrhoids were found during retroflexion. The       hemorrhoids were Grade I (internal hemorrhoids that do not prolapse).      A 5 mm polyp was found in the rectum. The polyp was sessile. The polyp       was removed with a jumbo cold forceps. Resection and retrieval were       complete. To prevent bleeding after the polypectomy, one hemostatic clip       was successfully placed (MR conditional). There was no bleeding at the       end of the procedure.      The exam was otherwise without abnormality.      Biopsies for histology were taken with a cold forceps from the random       colon for evaluation of microscopic colitis.      The exam was otherwise without abnormality. Impression:            - Non-bleeding internal hemorrhoids.                        - One 5 mm polyp in the rectum, removed with a jumbo                         cold forceps. Resected and retrieved. Clip (MR                         conditional) was placed.                        - The examination was otherwise normal.                        -  The examination was otherwise normal.                        - Biopsies were taken with a cold forceps from the                         random colon for evaluation of microscopic colitis. Recommendation:        - Patient has a contact number available for                         emergencies. The signs and symptoms of potential                         delayed complications were discussed with the  patient.                         Return to normal activities tomorrow. Written                         discharge instructions were provided to the patient.                        - Resume previous diet.                        - Continue present medications.                        - Await pathology results.                        - If polyps are benign or adenomatous without                         dysplasia, I will advise NO further colonoscopy due to                         advanced age and/or severe comorbidity.                        - You do NOT require further colon cancer screening                         measures (Annual stool testing (i.e. hemoccult, FIT,                         cologuard), sigmoidoscopy, colonoscopy or CT                         colonography). You should share this recommendation                         with your Primary Care provider.                        - Return to physician assistant in 2 months.                        - Follow up with Octavia Bruckner, PA-C in [ ]  months.                        -  The findings and recommendations were discussed with                         the patient. Procedure Code(s):     --- Professional ---                        [phone], Colonoscopy, flexible; with biopsy, single or                         multiple Diagnosis Code(s):     --- Professional ---                        R19.5, Other fecal abnormalities                        K59.1, Functional diarrhea                        K64.0, First degree hemorrhoids                        K62.1, Rectal polyp CPT copyright 2019 American Medical Association. All rights reserved. The codes documented in this report are preliminary and upon coder review may  be revised to meet current compliance requirements. Efrain Sella MD, MD 11/22/2020 11:03:41 AM This report has been signed electronically. Number of Addenda: 0 Note Initiated On: 11/22/2020 10:10 AM Scope Withdrawal Time: 0 hours 11  minutes 18 seconds  Total Procedure Duration: 0 hours 13 minutes 55 seconds  Estimated Blood Loss:  Estimated blood loss: none. Estimated blood loss: none.      [facility]

## 2020-11-22 NOTE — Transfer of Care (Signed)
Immediate Anesthesia Transfer of Care Note  Patient: Aaron Valdez  Procedure(s) Performed: COLONOSCOPY (N/A )  Patient Location: Endoscopy Unit  Anesthesia Type:General  Level of Consciousness: awake, alert  and oriented  Airway & Oxygen Therapy: Patient Spontanous Breathing and Patient connected to nasal cannula oxygen  Post-op Assessment: Report given to RN and Post -op Vital signs reviewed and stable  Post vital signs: Reviewed and stable  Last Vitals:  Vitals Value Taken Time  BP    Temp    Pulse    Resp    SpO2      Last Pain:  Vitals:   11/22/20 1010  TempSrc: Temporal  PainSc: 5          Complications: No complications documented.

## 2020-11-22 NOTE — Anesthesia Postprocedure Evaluation (Signed)
Anesthesia Post Note  Patient: Aaron Valdez  Procedure(s) Performed: COLONOSCOPY (N/A )  Patient location during evaluation: Endoscopy Anesthesia Type: General Level of consciousness: awake and alert Pain management: pain level controlled Vital Signs Assessment: post-procedure vital signs reviewed and stable Respiratory status: spontaneous breathing, nonlabored ventilation, respiratory function stable and patient connected to nasal cannula oxygen Cardiovascular status: blood pressure returned to baseline and stable Postop Assessment: no apparent nausea or vomiting Anesthetic complications: no   No complications documented.   Last Vitals:  Vitals:   11/22/20 1111 11/22/20 1121  BP: 131/82 129/82  Pulse: 69 78  Resp: 15 15  Temp:    SpO2: 93% 94%    Last Pain:  Vitals:   11/22/20 1121  TempSrc:   PainSc: 0-No pain                 Martha Clan

## 2020-11-22 NOTE — Anesthesia Preprocedure Evaluation (Addendum)
Anesthesia Evaluation  Patient identified by MRN, date of birth, ID band Patient awake    Reviewed: Allergy & Precautions, NPO status , Patient's Chart, lab work & pertinent test results  History of Anesthesia Complications Negative for: history of anesthetic complications  Airway Mallampati: II       Dental  (+) Missing, Chipped   Pulmonary shortness of breath and with exertion, neg COPD, neg recent URI, former smoker,           Cardiovascular hypertension, Pt. on medications (-) angina+ CAD  (-) Past MI and (-) CHF (-) dysrhythmias (-) Valvular Problems/Murmurs     Neuro/Psych neg Seizures negative neurological ROS  negative psych ROS   GI/Hepatic Neg liver ROS, hiatal hernia,   Endo/Other  diabetes, Type 2, Oral Hypoglycemic AgentsMorbid obesity  Renal/GU CRFRenal disease     Musculoskeletal   Abdominal   Peds  Hematology   Anesthesia Other Findings Past Medical History: No date: Aortic aneurysm (HCC)     Comment:  possible No date: Arthritis     Comment:  "ALL OVER" No date: Chronic kidney disease     Comment:  RENAL INSUFF No date: Coronary artery disease 10/14/2016: Diabetes mellitus ([place]) No date: Dyspnea     Comment:  WITH EXERTION 10/14/2016: Essential hypertension No date: History of hiatal hernia No date: HOH (hard of hearing) 10/14/2016: Hyperlipidemia No date: Inguinal hernia No date: Neuropathy 10/14/2016: Obesity   Reproductive/Obstetrics                            Anesthesia Physical  Anesthesia Plan  ASA: III  Anesthesia Plan: General   Post-op Pain Management:    Induction: Intravenous  PONV Risk Score and Plan: 2 and TIVA and Propofol infusion  Airway Management Planned: Natural Airway, Nasal Cannula and Nasal CPAP  Additional Equipment:   Intra-op Plan:   Post-operative Plan:   Informed Consent: I have reviewed the patients History and Physical,  chart, labs and discussed the procedure including the risks, benefits and alternatives for the proposed anesthesia with the patient or authorized representative who has indicated his/her understanding and acceptance.       Plan Discussed with:   Anesthesia Plan Comments:         Anesthesia Quick Evaluation

## 2020-11-22 NOTE — Interval H&P Note (Signed)
History and Physical Interval Note:  11/22/2020 10:33 AM  Aaron Valdez  has presented today for surgery, with the diagnosis of HEME POSITIVE,HX.OF COLON POLYP,FUNCTIONAL DIARRHEA.  The various methods of treatment have been discussed with the patient and family. After consideration of risks, benefits and other options for treatment, the patient has consented to  Procedure(s): COLONOSCOPY (N/A) as a surgical intervention.  The patient's history has been reviewed, patient examined, no change in status, stable for surgery.  I have reviewed the patient's chart and labs.  Questions were answered to the patient's satisfaction.     [place], [place]

## 2020-11-23 ENCOUNTER — Encounter: Payer: Self-pay | Admitting: Internal Medicine

## 2020-11-23 LAB — SURGICAL PATHOLOGY

## 2020-11-30 ENCOUNTER — Encounter: Payer: Self-pay | Admitting: Orthopedic Surgery

## 2020-11-30 ENCOUNTER — Other Ambulatory Visit: Payer: Self-pay | Admitting: Orthopedic Surgery

## 2020-11-30 NOTE — H&P (Signed)
Jeanette Moffatt MRN:  [id number] DOB/SEX:  Feb 19, 1946/male  CHIEF COMPLAINT:  Painful right Knee  HISTORY: Patient is a 75 y.o. male presented with a history of pain in the right knee. Onset of symptoms was gradual starting several months ago with gradually worsening course since that time. Prior procedures on the knee include arthroplasty. Patient has been treated conservatively with over-the-counter NSAIDs and activity modification. Patient currently rates pain in the knee at 10 out of 10 with activity. There is no pain at night.  PAST MEDICAL HISTORY: Patient Active Problem List   Diagnosis Date Noted  . Guaiac + stool   . Benign neoplasm of cecum   . Diabetes mellitus ([place]) 10/14/2016  . Essential hypertension 10/14/2016  . Hyperlipidemia 10/14/2016  . Obesity 10/14/2016   Past Medical History:  Diagnosis Date  . Aortic aneurysm (HCC)    possible  . Arthritis    "ALL OVER"  . Chronic kidney disease    RENAL INSUFF  . Coronary artery disease   . Diabetes mellitus ([place]) 10/14/2016  . Dyspnea    WITH EXERTION  . Essential hypertension 10/14/2016  . History of hiatal hernia   . HOH (hard of hearing)   . Hyperlipidemia 10/14/2016  . Inguinal hernia   . Neuropathy   . Obesity 10/14/2016   Past Surgical History:  Procedure Laterality Date  . CATARACT EXTRACTION W/PHACO Right 05/20/2017   Procedure: CATARACT EXTRACTION PHACO AND INTRAOCULAR LENS PLACEMENT (IOC);  Surgeon: Birder Robson, MD;  Location: [facility] ORS;  Service: Ophthalmology;  Laterality: Right;  [place] 01:03 AP% 19.9 CDE 12.53 Fluid pack lot # [id number]  . CATARACT EXTRACTION W/PHACO Left 09/01/2018   Procedure: CATARACT EXTRACTION PHACO AND INTRAOCULAR LENS PLACEMENT (IOC);  Surgeon: Birder Robson, MD;  Location: [facility] ORS;  Service: Ophthalmology;  Laterality: Left;  [place] 01:22.3 CDE 13.36 Fluid pack Lot # [id number]  . COLONOSCOPY N/A 11/22/2020   Procedure: COLONOSCOPY;  Surgeon: [place], Benay Pike, MD;  Location: [facility]  ENDOSCOPY;  Service: Gastroenterology;  Laterality: N/A;  . COLONOSCOPY WITH PROPOFOL N/A 10/17/2016   Procedure: COLONOSCOPY WITH PROPOFOL;  Surgeon: Lucilla Lame, MD;  Location: [place];  Service: Endoscopy;  Laterality: N/A;  . CORONARY ANGIOPLASTY    . FRACTURE SURGERY Left    elbow  . HIATAL HERNIA REPAIR    . NASAL SINUS SURGERY    . POLYPECTOMY  10/17/2016   Procedure: POLYPECTOMY;  Surgeon: Lucilla Lame, MD;  Location: [place];  Service: Endoscopy;;  . TONSILLECTOMY    . UMBILICAL HERNIA REPAIR       MEDICATIONS:  (Not in a hospital admission)   ALLERGIES:  No Known Allergies  REVIEW OF SYSTEMS:  Pertinent items are noted in HPI.   FAMILY HISTORY:   Family History  Problem Relation Age of Onset  . Heart disease Father   . AAA (abdominal aortic aneurysm) Father   . Cerebral aneurysm Brother     SOCIAL HISTORY:   Social History   Tobacco Use  . Smoking status: Former Smoker    Types: Cigarettes  . Smokeless tobacco: Former Network engineer Use Topics  . Alcohol use: No     EXAMINATION:  Vital signs in last 24 hours: @VSRANGES @  General appearance: alert, cooperative and no distress Neck: no JVD and supple, symmetrical, trachea midline Lungs: clear to auscultation bilaterally Heart: regular rate and rhythm, S1, S2 normal, no murmur, click, rub or gallop Abdomen: soft, non-tender; bowel sounds normal; no masses,  no organomegaly Extremities:  extremities normal, atraumatic, no cyanosis or edema and Homans sign is negative, no sign of DVT Pulses: 2+ and symmetric Skin: Skin color, texture, turgor normal. No rashes or lesions Neurologic: Alert and oriented X 3, normal strength and tone. Normal symmetric reflexes. Normal coordination and gait  Musculoskeletal:  ROM 0-90, Ligaments intact,  Imaging Review Plain radiographs demonstrate hardware failure  Assessment/Plan: Right knee hardware failure  The patient history, physical  examination and imaging studies are consistent with left knee hardware failure. The patient has failed conservative treatment.  The clearance notes were reviewed.  After discussion with the patient it was felt that Total Knee Revision was indicated. The procedure,  risks, and benefits of total knee arthroplasty were presented and reviewed. The risks including but not limited to aseptic loosening, infection, blood clots, vascular injury, stiffness, patella tracking problems complications among others were discussed. The patient acknowledged the explanation, agreed to proceed with the plan.  Carlynn Spry 11/30/2020, 9:47 AM

## 2020-12-08 ENCOUNTER — Encounter
Admission: RE | Admit: 2020-12-08 | Discharge: 2020-12-08 | Disposition: A | Discharge status: Home / Self Care | Payer: Medicare Other | Source: Ambulatory Visit | Attending: Orthopedic Surgery | Admitting: Orthopedic Surgery

## 2020-12-08 ENCOUNTER — Other Ambulatory Visit: Payer: Self-pay

## 2020-12-08 DIAGNOSIS — Z01812 Encounter for preprocedural laboratory examination: Secondary | ICD-10-CM | POA: Insufficient documentation

## 2020-12-08 LAB — TYPE AND SCREEN
ABO/RH(D): A NEG
Antibody Screen: NEGATIVE

## 2020-12-08 LAB — BASIC METABOLIC PANEL
Anion gap: 7 (ref 5–15)
BUN: 19 mg/dL (ref 8–23)
CO2: 25 mmol/L (ref 22–32)
Calcium: 9.4 mg/dL (ref 8.9–10.3)
Chloride: 103 mmol/L (ref 98–111)
Creatinine, Ser: 1.18 mg/dL (ref 0.61–1.24)
GFR, Estimated: >60 mL/min (ref >60)
Glucose, Bld: 126 mg/dL — ABNORMAL HIGH (ref 70–99)
Potassium: 3.9 mmol/L (ref 3.5–5.1)
Sodium: 135 mmol/L (ref 135–145)

## 2020-12-08 LAB — URINALYSIS, ROUTINE W REFLEX MICROSCOPIC
Bacteria, UA: NONE SEEN
Bilirubin Urine: NEGATIVE
Glucose, UA: >=500 mg/dL — AB
Hgb urine dipstick: NEGATIVE
Ketones, ur: 5 mg/dL — AB
Leukocytes,Ua: NEGATIVE
Nitrite: NEGATIVE
Protein, ur: NEGATIVE mg/dL
Specific Gravity, Urine: 1.037 — ABNORMAL HIGH (ref 1.005–1.030)
Squamous Epithelial / HPF: NONE SEEN (ref 0–5)
pH: 5 (ref 5.0–8.0)

## 2020-12-08 LAB — CBC
HCT: 44.1 % (ref 39.0–52.0)
Hemoglobin: 14.4 g/dL (ref 13.0–17.0)
MCH: 30.7 pg (ref 26.0–34.0)
MCHC: 32.7 g/dL (ref 30.0–36.0)
MCV: 94 fL (ref 80.0–100.0)
Platelets: 232 10*3/uL (ref 150–400)
RBC: 4.69 MIL/uL (ref 4.22–5.81)
RDW: 14 % (ref 11.5–15.5)
WBC: 8.6 10*3/uL (ref 4.0–10.5)
nRBC: 0 % (ref 0.0–0.2)

## 2020-12-08 LAB — PROTIME-INR
INR: 1 (ref 0.8–1.2)
Prothrombin Time: 13.2 seconds (ref 11.4–15.2)

## 2020-12-08 LAB — APTT: aPTT: 28 seconds (ref 24–36)

## 2020-12-08 LAB — SURGICAL PCR SCREEN
MRSA, PCR: NEGATIVE
Staphylococcus aureus: NEGATIVE

## 2020-12-08 NOTE — Patient Instructions (Addendum)
Your procedure is scheduled on: 12/18/20 Report to [place]. To find out your arrival time please call [phone] between 1PM - 3PM on 12/15/20.  Remember: Instructions that are not followed completely may result in serious medical risk, up to and including death, or upon the discretion of your surgeon and anesthesiologist your surgery may need to be rescheduled.     _X__ 1. Do not eat food or liquids after midnight the night before your procedure.                 No gum chewing or hard candies.. Diabetics water only  __X__2.  On the morning of surgery brush your teeth with toothpaste and water, you                 may rinse your mouth with mouthwash if you wish.  Do not swallow any              toothpaste of mouthwash.     _X__ 3.  No Alcohol for 24 hours before or after surgery.   _X__ 4.  Do Not Smoke or use e-cigarettes For 24 Hours Prior to Your Surgery.                 Do not use any chewable tobacco products for at least 6 hours prior to                 surgery.  ____  5.  Bring all medications with you on the day of surgery if instructed.   __X__  6.  Notify your doctor if there is any change in your medical condition      (cold, fever, infections).     Do not wear jewelry, make-up, hairpins, clips or nail polish. Do not wear lotions, powders, or perfumes or deodarant  Do not shave 48 hours prior to surgery. Men may shave face and neck. Do not bring valuables to the hospital.    [facility] is not responsible for any belongings or valuables.  Contacts, dentures/partials or body piercings may not be worn into surgery. Bring a case for your contacts, glasses or hearing aids, a denture cup will be supplied. Leave your suitcase in the car. After surgery it may be brought to your room. For patients admitted to the hospital, discharge time is determined by your treatment team.   Patients discharged the day of surgery will  not be allowed to drive home.   Please read over the following fact sheets that you were given:   MRSA Information, CHG soap, Incentive Spirometer  __X__ Take these medicines the morning of surgery with A SIP OF WATER:    1. rosuvastatin (CRESTOR) 10 MG tablet  2.   3.   4.  5.  6.  ____ Fleet Enema (as directed)   __X__ Use CHG Soap/SAGE wipes as directed  ____ Use inhalers on the day of surgery  __X__ Stop metformin/Janumet/Farxiga 2 days prior to surgery    ____ Take 1/2 of usual insulin dose the night before surgery. No insulin the morning          of surgery.   ____ Stop Blood Thinners Coumadin/Plavix/Xarelto/Pleta/Pradaxa/Eliquis/Effient/Aspirin  on   Or contact your Surgeon, Cardiologist or Medical Doctor regarding  ability to stop your blood thinners  __X__ Stop Anti-inflammatories 7 days before surgery such as Advil, Ibuprofen, Motrin,  BC or Goodies Powder, Naprosyn, Naproxen, Aleve, Aspirin   12/11/20  __X__ Stop all herbal supplements, fish oil or vitamin E until after surgery.  STOP ANY SUPPLEMENTS 12/11/20  ____ Bring C-Pap to the hospital.

## 2020-12-14 ENCOUNTER — Other Ambulatory Visit: Admission: RE | Admit: 2020-12-14 | Payer: Medicare Other | Source: Ambulatory Visit

## 2020-12-15 ENCOUNTER — Other Ambulatory Visit
Admission: RE | Admit: 2020-12-15 | Discharge: 2020-12-15 | Disposition: A | Discharge status: Home / Self Care | Payer: Medicare Other | Source: Ambulatory Visit | Attending: Orthopedic Surgery | Admitting: Orthopedic Surgery

## 2020-12-15 ENCOUNTER — Other Ambulatory Visit: Payer: Self-pay

## 2020-12-15 DIAGNOSIS — Z20822 Contact with and (suspected) exposure to covid-19: Secondary | ICD-10-CM | POA: Insufficient documentation

## 2020-12-15 DIAGNOSIS — Z01812 Encounter for preprocedural laboratory examination: Secondary | ICD-10-CM | POA: Insufficient documentation

## 2020-12-15 LAB — SARS CORONAVIRUS 2 (TAT 6-24 HRS): SARS Coronavirus 2: NEGATIVE

## 2020-12-18 ENCOUNTER — Inpatient Hospital Stay: Payer: Medicare Other

## 2020-12-18 ENCOUNTER — Inpatient Hospital Stay
Admission: RE | Admit: 2020-12-18 | Discharge: 2020-12-22 | DRG: 470 | Disposition: A | Discharge status: Skilled Nursing Facility | Payer: Medicare Other | Attending: Orthopedic Surgery | Admitting: Orthopedic Surgery

## 2020-12-18 ENCOUNTER — Other Ambulatory Visit: Payer: Self-pay

## 2020-12-18 ENCOUNTER — Encounter: Payer: Self-pay | Admitting: Orthopedic Surgery

## 2020-12-18 ENCOUNTER — Encounter
Admission: RE | Disposition: A | Discharge status: Skilled Nursing Facility | Payer: Self-pay | Source: Home / Self Care | Attending: Orthopedic Surgery

## 2020-12-18 DIAGNOSIS — Z20822 Contact with and (suspected) exposure to covid-19: Secondary | ICD-10-CM | POA: Diagnosis present

## 2020-12-18 DIAGNOSIS — E1142 Type 2 diabetes mellitus with diabetic polyneuropathy: Secondary | ICD-10-CM | POA: Diagnosis present

## 2020-12-18 DIAGNOSIS — D509 Iron deficiency anemia, unspecified: Secondary | ICD-10-CM | POA: Diagnosis not present

## 2020-12-18 DIAGNOSIS — R2681 Unsteadiness on feet: Secondary | ICD-10-CM | POA: Diagnosis not present

## 2020-12-18 DIAGNOSIS — E785 Hyperlipidemia, unspecified: Secondary | ICD-10-CM | POA: Diagnosis present

## 2020-12-18 DIAGNOSIS — E1140 Type 2 diabetes mellitus with diabetic neuropathy, unspecified: Secondary | ICD-10-CM | POA: Diagnosis not present

## 2020-12-18 DIAGNOSIS — Z96651 Presence of right artificial knee joint: Secondary | ICD-10-CM

## 2020-12-18 DIAGNOSIS — M1711 Unilateral primary osteoarthritis, right knee: Secondary | ICD-10-CM | POA: Diagnosis not present

## 2020-12-18 DIAGNOSIS — K5900 Constipation, unspecified: Secondary | ICD-10-CM | POA: Diagnosis not present

## 2020-12-18 DIAGNOSIS — R279 Unspecified lack of coordination: Secondary | ICD-10-CM | POA: Diagnosis not present

## 2020-12-18 DIAGNOSIS — E1122 Type 2 diabetes mellitus with diabetic chronic kidney disease: Secondary | ICD-10-CM | POA: Diagnosis present

## 2020-12-18 DIAGNOSIS — E0822 Diabetes mellitus due to underlying condition with diabetic chronic kidney disease: Secondary | ICD-10-CM | POA: Diagnosis not present

## 2020-12-18 DIAGNOSIS — Z419 Encounter for procedure for purposes other than remedying health state, unspecified: Secondary | ICD-10-CM

## 2020-12-18 DIAGNOSIS — N189 Chronic kidney disease, unspecified: Secondary | ICD-10-CM | POA: Diagnosis not present

## 2020-12-18 DIAGNOSIS — I129 Hypertensive chronic kidney disease with stage 1 through stage 4 chronic kidney disease, or unspecified chronic kidney disease: Secondary | ICD-10-CM | POA: Diagnosis present

## 2020-12-18 DIAGNOSIS — M6281 Muscle weakness (generalized): Secondary | ICD-10-CM | POA: Diagnosis not present

## 2020-12-18 DIAGNOSIS — I2510 Atherosclerotic heart disease of native coronary artery without angina pectoris: Secondary | ICD-10-CM | POA: Diagnosis present

## 2020-12-18 DIAGNOSIS — I10 Essential (primary) hypertension: Secondary | ICD-10-CM | POA: Diagnosis not present

## 2020-12-18 DIAGNOSIS — Z719 Counseling, unspecified: Secondary | ICD-10-CM | POA: Diagnosis not present

## 2020-12-18 DIAGNOSIS — M179 Osteoarthritis of knee, unspecified: Secondary | ICD-10-CM | POA: Diagnosis not present

## 2020-12-18 DIAGNOSIS — R5381 Other malaise: Secondary | ICD-10-CM | POA: Diagnosis not present

## 2020-12-18 DIAGNOSIS — R52 Pain, unspecified: Secondary | ICD-10-CM | POA: Diagnosis not present

## 2020-12-18 DIAGNOSIS — Z471 Aftercare following joint replacement surgery: Secondary | ICD-10-CM | POA: Diagnosis not present

## 2020-12-18 HISTORY — PX: TOTAL KNEE ARTHROPLASTY: SHX125

## 2020-12-18 LAB — GLUCOSE, CAPILLARY
Glucose-Capillary: 114 mg/dL — ABNORMAL HIGH (ref 70–99)
Glucose-Capillary: 128 mg/dL — ABNORMAL HIGH (ref 70–99)
Glucose-Capillary: 104 mg/dL — ABNORMAL HIGH (ref 70–99)

## 2020-12-18 LAB — ABO/RH: ABO/RH(D): A NEG

## 2020-12-18 SURGERY — ARTHROPLASTY, KNEE, TOTAL
Anesthesia: Spinal | Site: Knee | Laterality: Right

## 2020-12-18 MED ORDER — FENTANYL CITRATE (PF) 100 MCG/2ML IJ SOLN
25.0000 ug | INTRAMUSCULAR | Status: DC | PRN
Start: 2020-12-18 — End: 2020-12-18

## 2020-12-18 MED ORDER — ORAL CARE MOUTH RINSE: 15.0000 mL | Freq: Once | OROMUCOSAL | Status: AC

## 2020-12-18 MED ORDER — SEMAGLUTIDE (1 MG/DOSE) 4 MG/3ML ~~LOC~~ SOPN: 1.5000 mg | PEN_INJECTOR | SUBCUTANEOUS | Status: DC

## 2020-12-18 MED ORDER — FENTANYL CITRATE (PF) 100 MCG/2ML IJ SOLN
INTRAMUSCULAR | Status: AC
  Filled 2020-12-18: qty 2

## 2020-12-18 MED ORDER — DIPHENHYDRAMINE HCL 12.5 MG/5ML PO ELIX: 12.5000 mg | ORAL_SOLUTION | ORAL | Status: DC | PRN

## 2020-12-18 MED ORDER — MORPHINE SULFATE (PF) 2 MG/ML IV SOLN: 0.5000 mg | INTRAVENOUS | Status: DC | PRN

## 2020-12-18 MED ORDER — ACETAMINOPHEN 325 MG PO TABS: 325.0000 mg | ORAL_TABLET | Freq: Four times a day (QID) | ORAL | Status: DC | PRN

## 2020-12-18 MED ORDER — CEFAZOLIN SODIUM-DEXTROSE 2-4 GM/100ML-% IV SOLN
INTRAVENOUS | Status: AC
  Filled 2020-12-18: qty 100

## 2020-12-18 MED ORDER — SODIUM CHLORIDE 0.9 % IV SOLN: INTRAVENOUS | Status: DC

## 2020-12-18 MED ORDER — PROPOFOL 500 MG/50ML IV EMUL
INTRAVENOUS | Status: AC
  Filled 2020-12-18: qty 50

## 2020-12-18 MED ORDER — FAMOTIDINE 20 MG PO TABS
ORAL_TABLET | ORAL | Status: AC
  Administered 2020-12-18: 20 mg via ORAL
  Filled 2020-12-18: qty 1

## 2020-12-18 MED ORDER — ALUM & MAG HYDROXIDE-SIMETH 200-200-20 MG/5ML PO SUSP: 30.0000 mL | ORAL | Status: DC | PRN

## 2020-12-18 MED ORDER — PIOGLITAZONE HCL-METFORMIN HCL 15-850 MG PO TABS: 1.0000 | ORAL_TABLET | Freq: Three times a day (TID) | ORAL | Status: DC

## 2020-12-18 MED ORDER — BUPIVACAINE-EPINEPHRINE (PF) 0.5% -1:200000 IJ SOLN
INTRAMUSCULAR | Status: DC | PRN
  Administered 2020-12-18: 30 mL

## 2020-12-18 MED ORDER — MAGNESIUM CITRATE PO SOLN
1.0000 | Freq: Once | ORAL | Status: DC | PRN
  Filled 2020-12-18: qty 296

## 2020-12-18 MED ORDER — PROPOFOL 10 MG/ML IV BOLUS
INTRAVENOUS | Status: DC | PRN
  Administered 2020-12-18: 40 mg via INTRAVENOUS

## 2020-12-18 MED ORDER — ROSUVASTATIN CALCIUM 10 MG PO TABS
10.0000 mg | ORAL_TABLET | Freq: Every day | ORAL | Status: DC
  Administered 2020-12-19 – 2020-12-22 (×4): 10 mg via ORAL
  Filled 2020-12-18 (×4): qty 1

## 2020-12-18 MED ORDER — LACTATED RINGERS IV SOLN: INTRAVENOUS | Status: DC

## 2020-12-18 MED ORDER — PROPOFOL 500 MG/50ML IV EMUL
INTRAVENOUS | Status: DC | PRN
  Administered 2020-12-18: 75 ug/kg/min via INTRAVENOUS

## 2020-12-18 MED ORDER — METOCLOPRAMIDE HCL 10 MG PO TABS: 5.0000 mg | ORAL_TABLET | Freq: Three times a day (TID) | ORAL | Status: DC | PRN

## 2020-12-18 MED ORDER — BISACODYL 10 MG RE SUPP
10.0000 mg | Freq: Every day | RECTAL | Status: DC | PRN
  Administered 2020-12-21: 10 mg via RECTAL
  Filled 2020-12-18: qty 1

## 2020-12-18 MED ORDER — FENTANYL CITRATE (PF) 100 MCG/2ML IJ SOLN
INTRAMUSCULAR | Status: DC | PRN
  Administered 2020-12-18: 50 ug via INTRAVENOUS

## 2020-12-18 MED ORDER — HYDROCODONE-ACETAMINOPHEN 7.5-325 MG PO TABS
1.0000 | ORAL_TABLET | ORAL | Status: DC | PRN
  Administered 2020-12-19 – 2020-12-20 (×4): 2 via ORAL
  Administered 2020-12-21: 1 via ORAL
  Administered 2020-12-21: 2 via ORAL
  Administered 2020-12-22: 1 via ORAL
  Filled 2020-12-18 (×2): qty 1
  Filled 2020-12-18 (×5): qty 2

## 2020-12-18 MED ORDER — PIOGLITAZONE HCL 15 MG PO TABS
15.0000 mg | ORAL_TABLET | Freq: Every day | ORAL | Status: DC
  Administered 2020-12-19 – 2020-12-22 (×4): 15 mg via ORAL
  Filled 2020-12-18 (×5): qty 1

## 2020-12-18 MED ORDER — BUPIVACAINE HCL (PF) 0.5 % IJ SOLN
INTRAMUSCULAR | Status: DC | PRN
  Administered 2020-12-18: 3 mL

## 2020-12-18 MED ORDER — METFORMIN HCL 850 MG PO TABS
850.0000 mg | ORAL_TABLET | Freq: Every day | ORAL | Status: DC
  Administered 2020-12-19 – 2020-12-22 (×4): 850 mg via ORAL
  Filled 2020-12-18 (×5): qty 1

## 2020-12-18 MED ORDER — PROPOFOL 10 MG/ML IV BOLUS
INTRAVENOUS | Status: AC
  Filled 2020-12-18: qty 20

## 2020-12-18 MED ORDER — SODIUM CHLORIDE 0.9 % IV SOLN
INTRAVENOUS | Status: DC | PRN
  Administered 2020-12-18: 60 mL

## 2020-12-18 MED ORDER — TRANEXAMIC ACID-NACL 1000-0.7 MG/100ML-% IV SOLN
1000.0000 mg | INTRAVENOUS | Status: AC
  Administered 2020-12-18: 1000 mg via INTRAVENOUS

## 2020-12-18 MED ORDER — ASPIRIN 81 MG PO CHEW
81.0000 mg | CHEWABLE_TABLET | Freq: Two times a day (BID) | ORAL | Status: DC
  Administered 2020-12-18 – 2020-12-22 (×8): 81 mg via ORAL
  Filled 2020-12-18 (×8): qty 1

## 2020-12-18 MED ORDER — DOCUSATE SODIUM 100 MG PO CAPS
100.0000 mg | ORAL_CAPSULE | Freq: Two times a day (BID) | ORAL | Status: DC
  Administered 2020-12-18 – 2020-12-22 (×8): 100 mg via ORAL
  Filled 2020-12-18 (×8): qty 1

## 2020-12-18 MED ORDER — CHLORHEXIDINE GLUCONATE 0.12 % MT SOLN
OROMUCOSAL | Status: AC
  Administered 2020-12-18: 15 mL via OROMUCOSAL
  Filled 2020-12-18: qty 15

## 2020-12-18 MED ORDER — HYDROCODONE-ACETAMINOPHEN 5-325 MG PO TABS
1.0000 | ORAL_TABLET | ORAL | Status: DC | PRN
  Administered 2020-12-18 – 2020-12-22 (×5): 2 via ORAL
  Filled 2020-12-18 (×5): qty 2

## 2020-12-18 MED ORDER — ARTIFICIAL TEARS OPHTHALMIC OINT
TOPICAL_OINTMENT | OPHTHALMIC | Status: AC
  Filled 2020-12-18: qty 3.5

## 2020-12-18 MED ORDER — ONDANSETRON HCL 4 MG PO TABS: 4.0000 mg | ORAL_TABLET | Freq: Four times a day (QID) | ORAL | Status: DC | PRN

## 2020-12-18 MED ORDER — CHLORHEXIDINE GLUCONATE 0.12 % MT SOLN: 15.0000 mL | Freq: Once | OROMUCOSAL | Status: AC

## 2020-12-18 MED ORDER — CEFAZOLIN SODIUM-DEXTROSE 2-4 GM/100ML-% IV SOLN
2.0000 g | INTRAVENOUS | Status: AC
  Administered 2020-12-18: 2 g via INTRAVENOUS

## 2020-12-18 MED ORDER — MENTHOL 3 MG MT LOZG
1.0000 | LOZENGE | OROMUCOSAL | Status: DC | PRN
  Filled 2020-12-18: qty 9

## 2020-12-18 MED ORDER — TRANEXAMIC ACID-NACL 1000-0.7 MG/100ML-% IV SOLN
INTRAVENOUS | Status: AC
  Filled 2020-12-18: qty 100

## 2020-12-18 MED ORDER — PHENOL 1.4 % MT LIQD
1.0000 | OROMUCOSAL | Status: DC | PRN
  Filled 2020-12-18: qty 177

## 2020-12-18 MED ORDER — PHENYLEPHRINE HCL (PRESSORS) 10 MG/ML IV SOLN
INTRAVENOUS | Status: DC | PRN
  Administered 2020-12-18 (×2): 100 ug via INTRAVENOUS
  Administered 2020-12-18: 150 ug via INTRAVENOUS

## 2020-12-18 MED ORDER — SODIUM CHLORIDE 0.9 % IV SOLN
INTRAVENOUS | Status: DC | PRN
  Administered 2020-12-18: 20 ug/min via INTRAVENOUS

## 2020-12-18 MED ORDER — DAPAGLIFLOZIN PROPANEDIOL 5 MG PO TABS
10.0000 mg | ORAL_TABLET | Freq: Every day | ORAL | Status: DC
  Administered 2020-12-18 – 2020-12-22 (×5): 10 mg via ORAL
  Filled 2020-12-18 (×6): qty 2

## 2020-12-18 MED ORDER — METOCLOPRAMIDE HCL 5 MG/ML IJ SOLN: 5.0000 mg | Freq: Three times a day (TID) | INTRAMUSCULAR | Status: DC | PRN

## 2020-12-18 MED ORDER — ONDANSETRON HCL 4 MG/2ML IJ SOLN: 4.0000 mg | Freq: Once | INTRAMUSCULAR | Status: DC | PRN

## 2020-12-18 MED ORDER — KETOROLAC TROMETHAMINE 15 MG/ML IJ SOLN
7.5000 mg | Freq: Four times a day (QID) | INTRAMUSCULAR | Status: AC
  Administered 2020-12-18 – 2020-12-19 (×4): 7.5 mg via INTRAVENOUS
  Filled 2020-12-18 (×4): qty 1

## 2020-12-18 MED ORDER — ONDANSETRON HCL 4 MG/2ML IJ SOLN
4.0000 mg | Freq: Four times a day (QID) | INTRAMUSCULAR | Status: DC | PRN
  Administered 2020-12-19 – 2020-12-20 (×2): 4 mg via INTRAVENOUS
  Filled 2020-12-18 (×2): qty 2

## 2020-12-18 MED ORDER — POLYETHYL GLYCOL-PROPYL GLYCOL 0.4-0.3 % OP SOLN: Freq: Every day | OPHTHALMIC | Status: DC | PRN

## 2020-12-18 MED ORDER — FAMOTIDINE 20 MG PO TABS: 20.0000 mg | ORAL_TABLET | Freq: Once | ORAL | Status: AC

## 2020-12-18 MED ORDER — CEFAZOLIN SODIUM-DEXTROSE 2-4 GM/100ML-% IV SOLN
2.0000 g | Freq: Four times a day (QID) | INTRAVENOUS | Status: AC
  Administered 2020-12-18 (×2): 2 g via INTRAVENOUS
  Filled 2020-12-18 (×2): qty 100

## 2020-12-18 MED ORDER — POVIDONE-IODINE 10 % EX SWAB
2.0000 "application " | Freq: Once | CUTANEOUS | Status: AC
  Administered 2020-12-18: 2 via TOPICAL

## 2020-12-18 MED ORDER — RAMIPRIL 10 MG PO CAPS
10.0000 mg | ORAL_CAPSULE | Freq: Every day | ORAL | Status: DC
  Administered 2020-12-18 – 2020-12-22 (×5): 10 mg via ORAL
  Filled 2020-12-18 (×6): qty 1

## 2020-12-18 SURGICAL SUPPLY — 61 items
BLADE SAGITTAL AGGR TOOTH XLG (BLADE) ×2 IMPLANT
BLADE SAW SAG 25X90X1.19 (BLADE) ×2 IMPLANT
BOWL CEMENT MIX W/ADAPTER (MISCELLANEOUS) ×2 IMPLANT
BRUSH SCRUB EZ  4% CHG (MISCELLANEOUS) ×2
BRUSH SCRUB EZ 4% CHG (MISCELLANEOUS) ×2 IMPLANT
CANISTER SUCT 1200ML W/VALVE (MISCELLANEOUS) ×2 IMPLANT
CANISTER SUCT 3000ML PPV (MISCELLANEOUS) ×4 IMPLANT
CEMENT BONE 40GM (Cement) ×4 IMPLANT
CHLORAPREP W/TINT 26 (MISCELLANEOUS) ×4 IMPLANT
COMP PATELLA GENESIS 35MM (Stem) ×2 IMPLANT
COMPONENT FEM OXINIUM RT SZ5 (Knees) ×2 IMPLANT
COMPONENT PATELLA GENESIS 35MM (Stem) ×1 IMPLANT
COMPONENT TIBIA RIGHT SZ 4 (Knees) ×1 IMPLANT
COOLER POLAR GLACIER W/PUMP (MISCELLANEOUS) ×2 IMPLANT
COVER WAND RF STERILE (DRAPES) ×2 IMPLANT
CUFF TOURN SGL QUICK 30 (TOURNIQUET CUFF) ×1
CUFF TRNQT CYL 30X4X21-28X (TOURNIQUET CUFF) ×1 IMPLANT
DRAPE 3/4 80X56 (DRAPES) ×4 IMPLANT
DRAPE INCISE IOBAN 66X60 STRL (DRAPES) ×2 IMPLANT
ELECT REM PT RETURN 9FT ADLT (ELECTROSURGICAL) ×2
ELECTRODE REM PT RTRN 9FT ADLT (ELECTROSURGICAL) ×1 IMPLANT
GAUZE SPONGE 4X4 12PLY STRL (GAUZE/BANDAGES/DRESSINGS) ×2 IMPLANT
GAUZE XEROFORM 1X8 LF (GAUZE/BANDAGES/DRESSINGS) ×2 IMPLANT
GLOVE SURG ENC MOIS LTX SZ8 (GLOVE) ×2 IMPLANT
GLOVE SURG ORTHO LTX SZ8 (GLOVE) ×6 IMPLANT
GLOVE SURG UNDER LTX SZ8 (GLOVE) ×2 IMPLANT
GLOVE SURG UNDER POLY LF SZ8.5 (GLOVE) ×2 IMPLANT
GOWN STRL REUS W/ TWL LRG LVL3 (GOWN DISPOSABLE) ×1 IMPLANT
GOWN STRL REUS W/ TWL XL LVL3 (GOWN DISPOSABLE) ×1 IMPLANT
GOWN STRL REUS W/TWL LRG LVL3 (GOWN DISPOSABLE) ×1
GOWN STRL REUS W/TWL XL LVL3 (GOWN DISPOSABLE) ×1
HOOD PEEL AWAY FLYTE STAYCOOL (MISCELLANEOUS) ×6 IMPLANT
INSERT TIB XLPE 13 SZ 3-4 (Knees) ×2 IMPLANT
IRRIGATION SURGIPHOR STRL (IV SOLUTION) ×2 IMPLANT
IV NS 1000ML (IV SOLUTION) ×1
IV NS 1000ML BAXH (IV SOLUTION) ×1 IMPLANT
KIT TURNOVER KIT A (KITS) ×2 IMPLANT
MANIFOLD NEPTUNE II (INSTRUMENTS) ×2 IMPLANT
MAT ABSORB  FLUID 56X50 GRAY (MISCELLANEOUS) ×1
MAT ABSORB FLUID 56X50 GRAY (MISCELLANEOUS) ×1 IMPLANT
NDL SAFETY ECLIPSE 18X1.5 (NEEDLE) ×1 IMPLANT
NEEDLE HYPO 18GX1.5 SHARP (NEEDLE) ×1
NEEDLE SPNL 20GX3.5 QUINCKE YW (NEEDLE) ×2 IMPLANT
NS IRRIG 1000ML POUR BTL (IV SOLUTION) ×2 IMPLANT
PACK TOTAL KNEE (MISCELLANEOUS) ×2 IMPLANT
PAD DE MAYO PRESSURE PROTECT (MISCELLANEOUS) ×2 IMPLANT
PAD WRAPON POLAR KNEE (MISCELLANEOUS) ×1 IMPLANT
PULSAVAC PLUS IRRIG FAN TIP (DISPOSABLE) ×2
STAPLER SKIN PROX 35W (STAPLE) ×2 IMPLANT
SUCTION FRAZIER HANDLE 10FR (MISCELLANEOUS) ×1
SUCTION TUBE FRAZIER 10FR DISP (MISCELLANEOUS) ×1 IMPLANT
SUT DVC 2 QUILL PDO  T11 36X36 (SUTURE) ×1
SUT DVC 2 QUILL PDO T11 36X36 (SUTURE) ×1 IMPLANT
SUT VIC AB 2-0 CT1 18 (SUTURE) ×2 IMPLANT
SUT VIC AB 2-0 CT1 27 (SUTURE)
SUT VIC AB 2-0 CT1 TAPERPNT 27 (SUTURE) IMPLANT
SUT VIC AB PLUS 45CM 1-MO-4 (SUTURE) ×2 IMPLANT
SYR 30ML LL (SYRINGE) ×6 IMPLANT
TIBIA RIGHT SZ 4 (Knees) ×2 IMPLANT
TIP FAN IRRIG PULSAVAC PLUS (DISPOSABLE) ×1 IMPLANT
WRAPON POLAR PAD KNEE (MISCELLANEOUS) ×2

## 2020-12-18 NOTE — Plan of Care (Signed)

## 2020-12-18 NOTE — Anesthesia Procedure Notes (Signed)
Spinal  Patient location during procedure: OR Reason for block: surgical anesthesia Staffing Performed: resident/CRNA  Anesthesiologist: Molli Barrows, MD Resident/CRNA: Fredderick Phenix, CRNA Preanesthetic Checklist Completed: patient identified, IV checked, site marked, risks and benefits discussed, surgical consent, monitors and equipment checked, pre-op evaluation and timeout performed Spinal Block Patient position: sitting Prep: DuraPrep Patient monitoring: heart rate, cardiac monitor, continuous pulse ox and blood pressure Approach: midline Location: L3-4 Injection technique: single-shot Needle Needle type: Sprotte  Needle gauge: 24 G Needle length: 9 cm Assessment Sensory level: T4 Events: CSF return

## 2020-12-18 NOTE — Anesthesia Preprocedure Evaluation (Signed)
Anesthesia Evaluation  Patient identified by MRN, date of birth, ID band Patient awake    Reviewed: Allergy & Precautions, H&P , NPO status , Patient's Chart, lab work & pertinent test results, reviewed documented beta blocker date and time   Airway Mallampati: III   Neck ROM: full    Dental  (+) Poor Dentition   Pulmonary neg pulmonary ROS, shortness of breath, former smoker,    Pulmonary exam normal        Cardiovascular Exercise Tolerance: Poor hypertension, On Medications + CAD  negative cardio ROS Normal cardiovascular exam Rhythm:regular Rate:Normal     Neuro/Psych negative neurological ROS  negative psych ROS   GI/Hepatic Neg liver ROS, hiatal hernia,   Endo/Other  diabetesMorbid obesity  Renal/GU Renal disease  negative genitourinary   Musculoskeletal   Abdominal   Peds  Hematology negative hematology ROS (+)   Anesthesia Other Findings Past Medical History: No date: Aortic aneurysm (HCC)     Comment:  possible No date: Arthritis     Comment:  "ALL OVER" No date: Chronic kidney disease     Comment:  RENAL INSUFF No date: Coronary artery disease 10/14/2016: Diabetes mellitus ([place]) No date: Dyspnea     Comment:  WITH EXERTION 10/14/2016: Essential hypertension No date: History of hiatal hernia No date: HOH (hard of hearing) 10/14/2016: Hyperlipidemia No date: Inguinal hernia No date: Neuropathy 10/14/2016: Obesity Past Surgical History: 05/20/2017: CATARACT EXTRACTION W/PHACO; Right     Comment:  Procedure: CATARACT EXTRACTION PHACO AND INTRAOCULAR               LENS PLACEMENT ([place]);  Surgeon: Birder Robson, MD;                Location: [facility];  Service: Ophthalmology;  Laterality:              Right;  [place] 01:03 AP% 19.9 CDE 12.53 Fluid pack lot #               [id number] 09/01/2018: CATARACT EXTRACTION W/PHACO; Left     Comment:  Procedure: CATARACT EXTRACTION PHACO AND INTRAOCULAR                LENS PLACEMENT (I[facility]);  Surgeon: Birder Robson, MD;                Location: [facility];  Service: Ophthalmology;  Laterality:              Left;  [place] 01:22.3 CDE 13.36 Fluid pack Lot # [id number] 11/22/2020: COLONOSCOPY; N/A     Comment:  Procedure: COLONOSCOPY;  Surgeon: [place], Benay Pike, MD;              Location: [facility];  Service: Gastroenterology;                Laterality: N/A; 10/17/2016: COLONOSCOPY WITH PROPOFOL; N/A     Comment:  Procedure: COLONOSCOPY WITH PROPOFOL;  Surgeon: Lucilla Lame, MD;  Location: [place];  Service:               Endoscopy;  Laterality: N/A; No date: CORONARY ANGIOPLASTY No date: FRACTURE SURGERY; Left     Comment:  elbow No date: HIATAL HERNIA REPAIR No date: JOINT REPLACEMENT No date: NASAL SINUS SURGERY 10/17/2016: POLYPECTOMY     Comment:  Procedure: POLYPECTOMY;  Surgeon: Lucilla Lame, MD;  Location: [place];  Service: Endoscopy;; No date: TONSILLECTOMY No date: UMBILICAL HERNIA REPAIR   Reproductive/Obstetrics negative OB ROS                             Anesthesia Physical Anesthesia Plan  ASA: III  Anesthesia Plan: Spinal and General   Post-op Pain Management:    Induction:   PONV Risk Score and Plan: 3  Airway Management Planned:   Additional Equipment:   Intra-op Plan:   Post-operative Plan:   Informed Consent: I have reviewed the patients History and Physical, chart, labs and discussed the procedure including the risks, benefits and alternatives for the proposed anesthesia with the patient or authorized representative who has indicated his/her understanding and acceptance.     Dental Advisory Given  Plan Discussed with: CRNA  Anesthesia Plan Comments:         Anesthesia Quick Evaluation

## 2020-12-18 NOTE — H&P (Signed)
The patient has been re-examined, and the chart reviewed, and there have been no interval changes to the documented history and physical.  Plan a right total knee today.  Anesthesia is consulted regarding a peripheral nerve block for post-operative pain.  The risks, benefits, and alternatives have been discussed at length, and the patient is willing to proceed.     

## 2020-12-18 NOTE — Transfer of Care (Signed)
Immediate Anesthesia Transfer of Care Note  Patient: Aaron Valdez  Procedure(s) Performed: TOTAL KNEE ARTHROPLASTY (Right Knee)  Patient Location: PACU  Anesthesia Type:General and Spinal  Level of Consciousness: drowsy  Airway & Oxygen Therapy: Patient Spontanous Breathing  Post-op Assessment: Report given to RN and Post -op Vital signs reviewed and stable  Post vital signs: Reviewed and stable  Last Vitals:  Vitals Value Taken Time  BP 118/73 12/18/20 1500  Temp 36.3 C 12/18/20 1500  Pulse 71 12/18/20 1502  Resp 15 12/18/20 1502  SpO2 93 % 12/18/20 1502  Vitals shown include unvalidated device data.  Last Pain:  Vitals:   12/18/20 1104  PainSc: 3          Complications: No complications documented.

## 2020-12-18 NOTE — Op Note (Signed)
DATE OF SURGERY:  12/18/2020 TIME: 2:26 PM  PATIENT NAME:  Aaron Valdez   AGE: 75 y.o.    PRE-OPERATIVE DIAGNOSIS:  M17.11 Unilateral primary osteoarthritis, right knee  POST-OPERATIVE DIAGNOSIS:  Same  PROCEDURE:  Procedure(s): TOTAL KNEE ARTHROPLASTY, RIGHT  SURGEON:  Lovell Sheehan, MD   ASSISTANT:  Carlynn Spry,  PA-C  OPERATIVE IMPLANTS: Tamala [employer]hew, Cruciate Retaining Oxinium Femoral component size  5, Fixed Bearing Tray size 4, Patella polyethylene 3-peg oval button size 35 mm, with a 13 mm DISH insert.   PREOPERATIVE INDICATIONS:  Aaron Valdez is an 75 y.o. male who has a diagnosis of M17.11 Unilateral primary osteoarthritis, right knee and elected for a total knee arthroplasty after failing nonoperative treatment, including activity modification, pain medication, physical therapy and injections who has significant impairment of their activities of daily living.  Radiographs have demonstrated tricompartmental osteoarthritis joint space narrowing, osteophytes, subchondral sclerosis and cyst formation.  The risks, benefits, and alternatives were discussed at length including but not limited to the risks of infection, bleeding, nerve or blood vessel injury, knee stiffness, fracture, dislocation, loosening or failure of the hardware and the need for further surgery. Medical risks include but not limited to DVT and pulmonary embolism, myocardial infarction, stroke, pneumonia, respiratory failure and death. I discussed these risks with the patient in my office prior to the date of surgery. They understood these risks and were willing to proceed.  OPERATIVE FINDINGS AND UNIQUE ASPECTS OF THE CASE:  All three compartments with advanced and severe degenerative changes, large osteophytes and an abundance of synovial fluid. Significant deformity was also noted. A decision was made to proceed with total knee arthroplasty.   OPERATIVE DESCRIPTION:  The patient was brought to the  operative room and placed in a supine position after undergoing placement of a general anesthetic. IV antibiotics were given. Patient received tranexamic acid. The lower extremity was prepped and draped in the usual sterile fashion.  A time out was performed to verify the patient's name, date of birth, medical record number, correct site of surgery and correct procedure to be performed. The timeout was also used to confirm the patient received antibiotics and that appropriate instruments, implants and radiographs studies were available in the room.  The leg was elevated and exsanguinated with an Esmarch and the tourniquet was inflated to 250 mmHg.  A midline incision was made over the left knee.. A medial parapatellar arthrotomy was then made and the patella subluxed laterally and the knee was brought into 90 of flexion. Hoffa's fat pad along with the anterior cruciate ligament was resected and the medial joint line was exposed.  Attention was then turned to preparation of the patella. The thickness of the patella was measured with a caliper, the diameter measured with the patella templates.  The patella resection was then made with an oscillating saw using the patella cutting guide.  The 35 mm button fit appropriately.  3 peg holes for the patella component were then drilled.  The extramedullary tibial cutting guide was then placed using the anterior tibial crest and second ray of the foot as a reference.  The tibial cutting guide was adjusted to allow for appropriate posterior slope.  The tibial cutting block was pinned into position. The slotted stylus was used to measure the proximal tibial resection of 9 mm off the high lateral side. Care was taken during the tibial resection to protect the medial and collateral ligaments.  The resected tibial bone was removed.  The  distal femur was resected using the Visionaire cutting guide.  Care was taken to protect the collateral ligaments during distal femoral  resection.  The distal femoral resection was performed with an oscillating saw. The femoral cutting guide was then removed. Extension gap was measured with a 13 mm spacer block and alignment and extension was confirmed using a long alignment rod. The femur was sized to be a 5. Rotation of the referencing guide was checked with the epicondylar axis and Whitesides line. Then the 4-in-1 cutting jig was then applied to the distal femur. A stylus was used to confirm that the anterior femur would not be notched.   Then the anterior, posterior and chamfer femoral cuts were then made with an oscillating saw.  The knee was distracted and all posterior osteophytes were removed.  The flexion gap was then measured with a flexion spacer block and long alignment rod and was found to be symmetric with the extension gap and perpendicular to mechanical axis of the tibia.  The proximal tibia plateau was then sized with trial trays. The best coverage was achieved with a size 4. This tibial tray was then pinned into position. The proximal tibia was then prepared with the keel punch.  After tibial preparation was completed, all trial components were inserted with polyethylene trials. The knee achieved full extension and flexed to 120 degrees. Ligament were stable to varus and valgus at full extension as well as 30, 60 and 90 degrees of flexion.   The trials were then placed. Knee was taken through a full range of motion and deemed to be stable with the trial components. All trial components were then removed.  The joint was copiously irrigated with pulse lavage.  The final total knee arthroplasty components were then cemented into place. The knee was held in extension while cement was allowed to cure.The knee was taken through a range of motion and the patella tracked well and the knee was again irrigated copiously.  The knee capsule was then injected with Exparel.  The medial arthrotomy was closed with #1 Vicryl and #2 Quill.  The subcutaneous tissue closed with  2-0 vicryl, and skin approximated with staples.  A dry sterile and compressive dressing was applied.  A Polar Care was applied to the operative knee.  The patient was awakened and brought to the PACU in stable and satisfactory condition.  All sharp, lap and instrument counts were correct at the conclusion the case. I spoke with the patient's family in the postop consultation room to let them know the case had been performed without complication and the patient was stable in recovery room.   Total tourniquet time was 57 minutes.

## 2020-12-19 ENCOUNTER — Encounter: Payer: Self-pay | Admitting: Orthopedic Surgery

## 2020-12-19 LAB — BASIC METABOLIC PANEL
Anion gap: 7 (ref 5–15)
BUN: 21 mg/dL (ref 8–23)
CO2: 24 mmol/L (ref 22–32)
Calcium: 8.8 mg/dL — ABNORMAL LOW (ref 8.9–10.3)
Chloride: 103 mmol/L (ref 98–111)
Creatinine, Ser: 1.29 mg/dL — ABNORMAL HIGH (ref 0.61–1.24)
GFR, Estimated: 57 mL/min — ABNORMAL LOW (ref >60)
Glucose, Bld: 178 mg/dL — ABNORMAL HIGH (ref 70–99)
Potassium: 4.2 mmol/L (ref 3.5–5.1)
Sodium: 134 mmol/L — ABNORMAL LOW (ref 135–145)

## 2020-12-19 LAB — CBC
HCT: 38.9 % — ABNORMAL LOW (ref 39.0–52.0)
Hemoglobin: 12.9 g/dL — ABNORMAL LOW (ref 13.0–17.0)
MCH: 31 pg (ref 26.0–34.0)
MCHC: 33.2 g/dL (ref 30.0–36.0)
MCV: 93.5 fL (ref 80.0–100.0)
Platelets: 194 10*3/uL (ref 150–400)
RBC: 4.16 MIL/uL — ABNORMAL LOW (ref 4.22–5.81)
RDW: 13.5 % (ref 11.5–15.5)
WBC: 11.5 10*3/uL — ABNORMAL HIGH (ref 4.0–10.5)
nRBC: 0 % (ref 0.0–0.2)

## 2020-12-19 NOTE — NC FL2 (Signed)
[place] LEVEL OF CARE SCREENING TOOL     IDENTIFICATION  Patient Name: Aaron Valdez Birthdate: [birth year] Sex: male Admission Date (Current Location): 12/18/2020  [place] and [place] Number:  Engineering geologist and Address:  [facility], [address] [place], [place] [address]      Provider Number: [id number]  Attending Physician Name and Address:  Lovell Sheehan, MD  Relative Name and Phone Number:  COLTEN, DESROCHES (Daughter)   [phone]    Current Level of Care: Hospital Recommended Level of Care: [place] Prior Approval Number:    Date Approved/Denied:   PASRR Number: [id number]  Discharge Plan: SNF    Current Diagnoses: Patient Active Problem List   Diagnosis Date Noted  . History of total knee arthroplasty, right 12/18/2020  . Guaiac + stool   . Benign neoplasm of cecum   . Diabetes mellitus ([place]) 10/14/2016  . Essential hypertension 10/14/2016  . Hyperlipidemia 10/14/2016  . Obesity 10/14/2016    Orientation RESPIRATION BLADDER Height & Weight     Self,Time,Situation,Place  Normal Continent Weight:   Height:     BEHAVIORAL SYMPTOMS/MOOD NEUROLOGICAL BOWEL NUTRITION STATUS      Continent Diet  AMBULATORY STATUS COMMUNICATION OF NEEDS Skin   Limited Assist Verbally Normal                       Personal Care Assistance Level of Assistance  Bathing,Feeding,Dressing,Total care Bathing Assistance: Limited assistance Feeding assistance: Independent Dressing Assistance: Limited assistance Total Care Assistance: Limited assistance   Functional Limitations Info  Sight,Hearing,Speech Sight Info: Adequate Hearing Info: Adequate      SPECIAL CARE FACTORS FREQUENCY       PT 5X per week                Contractures Contractures Info: Not present    Additional Factors Info                  Current Medications (12/19/2020):  This is the current hospital active medication  list Current Facility-Administered Medications  Medication Dose Route Frequency Provider Last Rate Last Admin  . acetaminophen (TYLENOL) tablet 325-650 mg  325-650 mg Oral Q6H PRN Lovell Sheehan, MD      . alum & mag hydroxide-simeth (MAALOX/MYLANTA) 200-200-20 MG/5ML suspension 30 mL  30 mL Oral Q4H PRN Lovell Sheehan, MD      . aspirin chewable tablet 81 mg  81 mg Oral BID Lovell Sheehan, MD   81 mg at 12/19/20 0902  . bisacodyl (DULCOLAX) suppository 10 mg  10 mg Rectal Daily PRN Lovell Sheehan, MD      . dapagliflozin propanediol [facility]) tablet 10 mg  10 mg Oral Daily Lovell Sheehan, MD   10 mg at 12/19/20 2536  . diphenhydrAMINE (BENADRYL) 12.5 MG/5ML elixir 12.5-25 mg  12.5-25 mg Oral Q4H PRN Lovell Sheehan, MD      . docusate sodium (COLACE) capsule 100 mg  100 mg Oral BID Lovell Sheehan, MD   100 mg at 12/19/20 0902  . HYDROcodone-acetaminophen (NORCO) 7.5-325 MG per tablet 1-2 tablet  1-2 tablet Oral Q4H PRN Lovell Sheehan, MD      . HYDROcodone-acetaminophen (NORCO/VICODIN) 5-325 MG per tablet 1-2 tablet  1-2 tablet Oral Q4H PRN Lovell Sheehan, MD   2 tablet at 12/19/20 0902  . lactated ringers infusion   Intravenous Continuous Lovell Sheehan, MD   Stopped at 12/19/20  0129  . magnesium citrate solution 1 Bottle  1 Bottle Oral Once PRN Lovell Sheehan, MD      . menthol-cetylpyridinium (CEPACOL) lozenge 3 mg  1 lozenge Oral PRN Lovell Sheehan, MD       Or  . phenol (CHLORASEPTIC) mouth spray 1 spray  1 spray Mouth/Throat PRN Lovell Sheehan, MD      . metFORMIN (GLUCOPHAGE) tablet 850 mg  850 mg Oral Q breakfast Dorothe Pea, RPH   850 mg at 12/19/20 9675   And  . pioglitazone (ACTOS) tablet 15 mg  15 mg Oral Q breakfast Dorothe Pea, RPH   15 mg at 12/19/20 9163  . metoCLOPramide (REGLAN) tablet 5-10 mg  5-10 mg Oral Q8H PRN Lovell Sheehan, MD       Or  . metoCLOPramide (REGLAN) injection 5-10 mg  5-10 mg Intravenous Q8H PRN Lovell Sheehan, MD      . morphine 2  MG/ML injection 0.5-1 mg  0.5-1 mg Intravenous Q2H PRN Lovell Sheehan, MD      . ondansetron [facility]) tablet 4 mg  4 mg Oral Q6H PRN Lovell Sheehan, MD       Or  . ondansetron [facility]) injection 4 mg  4 mg Intravenous Q6H PRN Lovell Sheehan, MD      . ramipril (ALTACE) capsule 10 mg  10 mg Oral Daily Lovell Sheehan, MD   10 mg at 12/19/20 8466  . rosuvastatin (CRESTOR) tablet 10 mg  10 mg Oral Daily Lovell Sheehan, MD   10 mg at 12/19/20 5993     Discharge Medications: Please see discharge summary for a list of discharge medications.  Relevant Imaging Results:  Relevant Lab Results:   Additional Information SS# [id number]  Adelene Amas, LCSWA

## 2020-12-19 NOTE — TOC Initial Note (Addendum)
Transition of Care [facility]) - Initial/Assessment Note    Patient Details  Name: Aaron Valdez MRN: [id number] Date of Birth: July 11, 1946  [facility] CM/SW Contact:    [name] Phone Number: [phone] 12/19/2020, 3:58 PM  Clinical Narrative:                  Patient presents at [facility] for surgery on left knee.  Patient lives alone at home and is able to perform ADLs but has a lot of pain at knee when he ambulated and active. PT evaluated patient and recommended SNF.  CSW spoke with Aaron Valdez (Daughter)  [phone] and she stated the patient was amenable to SNF placement and did not have a specific SNF preference.  CSW explained SNF placement process and estimated timeline for placement.   PASRR# [id number] A, FL2 sent for signature.  Expected Discharge Plan: Skilled Nursing Facility Barriers to Discharge: Continued Medical Work up,SNF Pending bed offer   Patient Goals and CMS Choice        Expected Discharge Plan and Services Expected Discharge Plan: [place] In-house Referral: NA   Post Acute Care Choice: [place] Living arrangements for the past 2 months: Single Family Home                                      Prior Living Arrangements/Services Living arrangements for the past 2 months: Single Family Home Lives with:: Self Patient language and need for interpreter reviewed:: Yes Do you feel safe going back to the place where you live?: Yes      Need for Family Participation in Patient Care: Yes (Comment) Care giver support system in place?: Yes (comment)   Criminal Activity/Legal Involvement Pertinent to Current Situation/Hospitalization: No - Comment as needed  Activities of Daily Living Home Assistive Devices/Equipment: Dentures (specify type),Hearing aid,Walker (specify type) ADL Screening (condition at time of admission) Patient's cognitive ability adequate to safely complete daily activities?:  Yes Is the patient deaf or have difficulty hearing?: Yes Does the patient have difficulty seeing, even when wearing glasses/contacts?: No Does the patient have difficulty concentrating, remembering, or making decisions?: No Patient able to express need for assistance with ADLs?: No Does the patient have difficulty dressing or bathing?: No Independently performs ADLs?: Yes (appropriate for developmental age) Does the patient have difficulty walking or climbing stairs?: Yes Weakness of Legs: Right Weakness of Arms/Hands: None  Permission Sought/Granted Permission sought to share information with : Facility Sport and exercise psychologist    Share Information with NAME: Aaron Valdez (Daughter)   [phone]           Emotional Assessment Appearance:: Appears stated age Attitude/Demeanor/Rapport: Engaged Affect (typically observed): Stable Orientation: : Oriented to Self,Oriented to Place,Oriented to  Time,Oriented to Situation Alcohol / Substance Use: Not Applicable Psych Involvement: No (comment)  Admission diagnosis:  History of total knee arthroplasty, right [Z96.651] Patient Active Problem List   Diagnosis Date Noted  . History of total knee arthroplasty, right 12/18/2020  . Guaiac + stool   . Benign neoplasm of cecum   . Diabetes mellitus ([place]) 10/14/2016  . Essential hypertension 10/14/2016  . Hyperlipidemia 10/14/2016  . Obesity 10/14/2016   PCP:  Aaron Marble, MD Pharmacy:   [facility] [phone] Aaron Valdez, [address] [place] [address] Phone: [phone] Fax:  [phone]     Social Determinants of Health (SDOH) Interventions    Readmission Risk Interventions No flowsheet data found.

## 2020-12-19 NOTE — Progress Notes (Signed)
  Subjective:  Patient reports pain as moderate.    Objective:   VITALS:   Vitals:   12/18/20 2013 12/19/20 0136 12/19/20 0755 12/19/20 1138  BP: (!) 126/57 111/63 121/60 111/62  Pulse: 82 87 79 83  Resp: 18 16 16 16   Temp:  98.7 F (37.1 C) 99 F (37.2 C) (!) 97.3 F (36.3 C)  TempSrc:  Oral    SpO2: 97%  93% 96%    PHYSICAL EXAM:  ABD soft Sensation intact distally Dorsiflexion/Plantar flexion intact Incision: dressing C/D/I No cellulitis present Compartment soft  LABS  Results for orders placed or performed during the hospital encounter of 12/18/20 (from the past 24 hour(s))  Glucose, capillary     Status: Abnormal   Collection Time: 12/18/20  3:02 PM  Result Value Ref Range   Glucose-Capillary 128 (H) 70 - 99 mg/dL  Glucose, capillary     Status: Abnormal   Collection Time: 12/18/20  4:44 PM  Result Value Ref Range   Glucose-Capillary 104 (H) 70 - 99 mg/dL   Comment 1 Notify RN    Comment 2 Document in Chart   CBC     Status: Abnormal   Collection Time: 12/19/20  6:06 AM  Result Value Ref Range   WBC 11.5 (H) 4.0 - 10.5 K/uL   RBC 4.16 (L) 4.22 - 5.81 MIL/uL   Hemoglobin 12.9 (L) 13.0 - 17.0 g/dL   HCT 38.9 (L) 39.0 - 52.0 %   MCV 93.5 80.0 - 100.0 fL   MCH 31.0 26.0 - 34.0 pg   MCHC 33.2 30.0 - 36.0 g/dL   RDW 13.5 11.5 - 15.5 %   Platelets 194 150 - 400 K/uL   nRBC 0.0 0.0 - 0.2 %  Basic metabolic panel     Status: Abnormal   Collection Time: 12/19/20  6:06 AM  Result Value Ref Range   Sodium 134 (L) 135 - 145 mmol/L   Potassium 4.2 3.5 - 5.1 mmol/L   Chloride 103 98 - 111 mmol/L   CO2 24 22 - 32 mmol/L   Glucose, Bld 178 (H) 70 - 99 mg/dL   BUN 21 8 - 23 mg/dL   Creatinine, Ser 1.29 (H) 0.61 - 1.24 mg/dL   Calcium 8.8 (L) 8.9 - 10.3 mg/dL   GFR, Estimated 57 (L) >60 mL/min   Anion gap 7 5 - 15    DG Knee Right Port  Result Date: 12/18/2020 CLINICAL DATA:  Post RIGHT knee arthroplasty EXAM: PORTABLE RIGHT KNEE - 1-2 VIEW COMPARISON:   Portable exam 1423 hours compared to 12/31/2007 FINDINGS: Interval RIGHT total knee arthroplasty. Mild osseous demineralization. Joint alignment normal. No acute fracture, dislocation, bone destruction or periprosthetic lucency. Anterior skin clips and postsurgical changes of the soft tissues. IMPRESSION: RIGHT knee prosthesis without acute complication. Electronically Signed   By: Lavonia Dana M.D.   On: 12/18/2020 14:52    Assessment/Plan: 1 Day Post-Op   Active Problems:   History of total knee arthroplasty, right   Up with therapy Discharge to SNF tomorrow if possible Change bandage tomorrow   Lovell Sheehan , MD 12/19/2020, 1:09 PM

## 2020-12-19 NOTE — Evaluation (Signed)
Occupational Therapy Evaluation Patient Details Name: Aaron Valdez MRN: [id number] DOB: 09/14/1946 Today's Date: 12/19/2020    History of Present Illness Patient is a 75 yo male s/p R total knee revision, WBAT. PMH of DM, HTN, CKD, HLD, hiatal hernia.   Clinical Impression   Pt seen for OT evaluation this date, POD#1 from above surgery. Pt was modified independent in all ADL prior to surgery, however had difficulty with prolonged standing for grooming at sink and in kitchen for meal prep 2/2 R knee pain. Pt used a rollator (was his spouse's) for bringing in groceries, otherwise, not using AD for mobility. Pt is eager to return to PLOF with less pain and improved safety and independence. Pt reports goal is to go to rehab because he does not have help at home. Pt currently requires PRN minimal assist for LB dressing and bathing while in seated position due to pain and limited strength/AROM of R knee. Pt instructed in polar care mgt, falls prevention strategies, home/routines modifications, DME/AE for LB bathing and dressing tasks. Handout provided. Pt would benefit from skilled OT services including additional instruction in dressing techniques with or without assistive devices for dressing and bathing skills to support recall and carryover prior to discharge and ultimately to maximize safety, independence, and minimize falls risk and caregiver burden. Follow up therapy per MD.       Follow Up Recommendations  Follow surgeon's recommendation for DC plan and follow-up therapies    Equipment Recommendations  3 in 1 bedside commode    Recommendations for Other Services       Precautions / Restrictions Precautions Precautions: Knee Precaution Booklet Issued: Yes (comment) Restrictions Weight Bearing Restrictions: Yes RLE Weight Bearing: Weight bearing as tolerated      Mobility Bed Mobility Overal bed mobility: Modified Independent                  Transfers Overall transfer  level: Needs assistance Equipment used: Rolling walker (2 wheeled) Transfers: Sit to/from Stand Sit to Stand: Supervision         General transfer comment: effortful but declines assist from OT to perform from std height bed    Balance Overall balance assessment: Needs assistance Sitting-balance support: Feet supported Sitting balance-Leahy Scale: Good     Standing balance support: Bilateral upper extremity supported;During functional activity Standing balance-Leahy Scale: Fair Standing balance comment: reliant on UE support for dynamic tasks                           ADL either performed or assessed with clinical judgement   ADL Overall ADL's : Needs assistance/impaired                                       General ADL Comments: PRN MIN A for LB bathing and dressing, educated in AE for improved independence with LB dressing, tub transfers, falls prevention, polar care mgt     Vision Patient Visual Report: No change from baseline       Perception     Praxis      Pertinent Vitals/Pain Pain Assessment: 0-10 Pain Score: 5  Pain Location: R knee with ADL mobility Pain Descriptors / Indicators: Aching;Sore Pain Intervention(s): Limited activity within patient's tolerance;Monitored during session;Premedicated before session;Repositioned;Ice applied     Hand Dominance     Extremity/Trunk Assessment Upper Extremity Assessment Upper Extremity  Assessment: Overall WFL for tasks assessed (pt reported L shoulder pain and recent injection in Feb)   Lower Extremity Assessment Lower Extremity Assessment: RLE deficits/detail;LLE deficits/detail RLE Deficits / Details: s/p sx, able to lift against gravity with no issues LLE Deficits / Details: [facility]       Communication Communication Communication: HOH;Other (comment) (hearing aide)   Cognition Arousal/Alertness: Awake/alert Behavior During Therapy: WFL for tasks assessed/performed Overall  Cognitive Status: Within Functional Limits for tasks assessed                                     General Comments       Exercises Total Joint Exercises Ankle Circles/Pumps: AROM;Strengthening;Both;10 reps Quad Sets: AROM;Strengthening;Both;10 reps Heel Slides: AROM;Strengthening;Both;15 reps Long Arc Quad: AROM;Both;15 reps Knee Flexion: AROM;Both;15 reps Goniometric ROM: 0 - 90degrees Other Exercises Other Exercises: Pt ambulated to bathroom with supervision with RW, no LOB noted. STS transfer to std height toilet with use of L grab bar and supervision. Increased effort.   Shoulder Instructions      Home Living Family/patient expects to be discharged to:: Private residence Living Arrangements: Alone Available Help at Discharge:  (no help available) Type of Home: Mobile home Home Access: Ramped entrance     Home Layout: One level     Bathroom Shower/Tub: Teacher, early years/pre: Standard     Home Equipment: Environmental consultant - 2 wheels;Grab bars - toilet;Grab bars - tub/shower   Additional Comments: rollator      Prior Functioning/Environment Level of Independence: Independent        Comments: PRN rollator use when bringing in groceries, mod indep with meal prep, grooming unable to tolerate prolonged standing        OT Problem List: Decreased strength;Pain;Decreased activity tolerance;Decreased knowledge of use of DME or AE;Impaired balance (sitting and/or standing)      OT Treatment/Interventions: Self-care/ADL training;Therapeutic exercise;Therapeutic activities;DME and/or AE instruction;Patient/family education;Balance training    OT Goals(Current goals can be found in the care plan section) Acute Rehab OT Goals Patient Stated Goal: to go to rehab OT Goal Formulation: With patient Time For Goal Achievement: 01/02/21 Potential to Achieve Goals: Good ADL Goals Pt Will Perform Lower Body Dressing: with modified independence;with adaptive  equipment;sit to/from stand Pt Will Transfer to Toilet: with modified independence;ambulating (LRAD for amb) Additional ADL Goal #1: Pt will independently instruct family/caregiver in polar care mgt Additional ADL Goal #2: Pt will independently instruct family/caregiver in compression stocking mgt  OT Frequency: Min 1X/week   Barriers to D/C:            Co-evaluation              AM-PAC OT "6 Clicks" Daily Activity     Outcome Measure Help from another person eating meals?: None Help from another person taking care of personal grooming?: None Help from another person toileting, which includes using toliet, bedpan, or urinal?: None Help from another person bathing (including washing, rinsing, drying)?: A Little Help from another person to put on and taking off regular upper body clothing?: None Help from another person to put on and taking off regular lower body clothing?: A Little 6 Click Score: 22   End of Session Nurse Communication: Other (comment) (bandaging on R knee)  Activity Tolerance: Patient tolerated treatment well Patient left: in bed;with call bell/phone within reach;with bed alarm set;with nursing/sitter in room;with SCD's reapplied;Other (comment) (polar care  applied, RN notified of R knee bandaging)  OT Visit Diagnosis: Other abnormalities of gait and mobility (R26.89);Muscle weakness (generalized) (M62.81);Pain Pain - Right/Left: Right Pain - part of body: Knee                Time: 1546-1610 OT Time Calculation (min): 24 min Charges:  OT General Charges $OT Visit: 1 Visit OT Evaluation $OT Eval Moderate Complexity: 1 Mod OT Treatments $Self Care/Home Management : 8-22 mins  Hanley Hays, MPH, MS, OTR/L ascom [phone] 12/19/20, 4:28 PM

## 2020-12-19 NOTE — Progress Notes (Signed)
Physical Therapy Treatment Patient Details Name: Aaron Valdez MRN: [id number] DOB: 05/22/1946 Today's Date: 12/19/2020    History of Present Illness Patient is a 75 yo male s/p R total knee revision, WBAT. PMH of DM, HTN, CKD, HLD, hiatal hernia.    PT Comments    Pt alert, in bed, recently transitioned for chair. Agreeable to PT, reported 4/10 in his R knee. Several exercises performed with visual cues, no physical assist needed. The patient demonstrated bed mobility modI, and able to sit EOB with good balance. Sit <> stand from EOB with CGA and RW, ambulated ~60ft. Pt with improved gait velocity and cadence this PM, but still exhibited decreased activity tolerance/endurance. Returned to bed and pt agreeable to attempt to be up out of bed for dinner in PM. All needs in reach at end of session. The patient would benefit from further skilled PT intervention to continue to progress towards goals. Recommendation remains appropriate.       Follow Up Recommendations  Follow surgeon's recommendation for DC plan and follow-up therapies;SNF     Equipment Recommendations  None recommended by PT    Recommendations for Other Services       Precautions / Restrictions Precautions Precautions: Knee Precaution Booklet Issued: Yes (comment) Restrictions Weight Bearing Restrictions: Yes RLE Weight Bearing: Weight bearing as tolerated    Mobility  Bed Mobility Overal bed mobility: Modified Independent                  Transfers Overall transfer level: Needs assistance Equipment used: Rolling walker (2 wheeled) Transfers: Sit to/from Stand Sit to Stand: Min guard         General transfer comment: effortful for pt, required use of BUE  Ambulation/Gait Ambulation/Gait assistance: Min guard Gait Distance (Feet): 60 Feet Assistive device: Rolling walker (2 wheeled)       General Gait Details: antalgic, step to gait on RLE, improved gait velocity noted though pt still  limited   Stairs             Wheelchair Mobility    Modified Rankin (Stroke Patients Only)       Balance Overall balance assessment: Needs assistance Sitting-balance support: Feet supported Sitting balance-Leahy Scale: Good       Standing balance-Leahy Scale: Fair Standing balance comment: reliant on UE support for dynamic tasks                            Cognition Arousal/Alertness: Awake/alert Behavior During Therapy: WFL for tasks assessed/performed Overall Cognitive Status: Within Functional Limits for tasks assessed                                        Exercises Total Joint Exercises Ankle Circles/Pumps: AROM;Strengthening;Both;10 reps Quad Sets: AROM;Strengthening;Both;10 reps Heel Slides: AROM;Strengthening;Both;15 reps Long Arc Quad: AROM;Both;15 reps Knee Flexion: AROM;Both;15 reps Goniometric ROM: 0 - 90degrees    General Comments        Pertinent Vitals/Pain Pain Assessment: 0-10 Pain Score: 4  Pain Location: R knee Pain Descriptors / Indicators: Aching;Sore Pain Intervention(s): Limited activity within patient's tolerance;Monitored during session;Repositioned;Ice applied    Home Living                      Prior Function            PT Goals (current goals can  now be found in the care plan section) Progress towards PT goals: Progressing toward goals    Frequency    BID      PT Plan Current plan remains appropriate    Co-evaluation              AM-PAC PT "6 Clicks" Mobility   Outcome Measure  Help needed turning from your back to your side while in a flat bed without using bedrails?: A Little Help needed moving from lying on your back to sitting on the side of a flat bed without using bedrails?: A Little Help needed moving to and from a bed to a chair (including a wheelchair)?: A Little Help needed standing up from a chair using your arms (e.g., wheelchair or bedside chair)?: A  Little Help needed to walk in hospital room?: A Little Help needed climbing 3-5 steps with a railing? : A Lot 6 Click Score: 17    End of Session Equipment Utilized During Treatment: Gait belt Activity Tolerance: Patient tolerated treatment well Patient left: with call bell/phone within reach;in bed;with bed alarm set;Other (comment) (bone foam and polar care in place) Nurse Communication: Mobility status PT Visit Diagnosis: Other abnormalities of gait and mobility (R26.89);Muscle weakness (generalized) (M62.81);Difficulty in walking, not elsewhere classified (R26.2);Pain Pain - Right/Left: Right Pain - part of body: Knee     Time: 2094-7096 PT Time Calculation (min) (ACUTE ONLY): 25 min  Charges:  $Therapeutic Exercise: 23-37 mins                    Lieutenant Diego PT, DPT 3:28 PM,12/19/20

## 2020-12-19 NOTE — Anesthesia Postprocedure Evaluation (Signed)
Anesthesia Post Note  Patient: Aaron Valdez  Procedure(s) Performed: TOTAL KNEE ARTHROPLASTY (Right Knee)  Patient location during evaluation: Nursing Unit Anesthesia Type: Spinal Level of consciousness: oriented and awake and alert Pain management: pain level controlled Vital Signs Assessment: post-procedure vital signs reviewed and stable Respiratory status: spontaneous breathing and respiratory function stable Cardiovascular status: blood pressure returned to baseline and stable Postop Assessment: no headache, no backache, no apparent nausea or vomiting and patient able to bend at knees Anesthetic complications: no   No complications documented.   Last Vitals:  Vitals:   12/19/20 0136 12/19/20 0755  BP: 111/63 121/60  Pulse: 87 79  Resp: 16 16  Temp: 37.1 C 37.2 C  SpO2:  93%    Last Pain:  Vitals:   12/19/20 0905  TempSrc:   PainSc: 7                  Alison Stalling

## 2020-12-19 NOTE — Evaluation (Signed)
Physical Therapy Evaluation Patient Details Name: Aaron Valdez MRN: [id number] DOB: 03/22/1946 Today's Date: 12/19/2020   History of Present Illness  Patient is a 75 yo male s/p R total knee revision, WBAT. PMH of DM, HTN, CKD, HLD, hiatal hernia.  Clinical Impression  Patient alert, agreeable to PT, repoted 2/10 in R knee for pain. Pt stated at baseline he is independent, lives alone (with a cat). Does not have assistance at discharge. Reported 2 falls in the last 6 months, involved spontaneous R knee buckling.   The patient was able to perform bed mobility modI. Sit <> stand from EOB and Recliner with RW and CGA, effortful for patient and required bilateral UE support. He was able to ambulate ~44ft with RW and close CGA, exhibited antalgic gait and step to gait pattern. Pt with DOE, spO2 >90% and HR ranging from 70s-100s.  Overall the patient demonstrated deficits (see "PT Problem List") that impede the patient's functional abilities, safety, and mobility and would benefit from skilled PT intervention. Recommendation is SNF to maximize safety, independence, and return pt to PLOF.      Follow Up Recommendations Follow surgeon's recommendation for DC plan and follow-up therapies;SNF    Equipment Recommendations  None recommended by PT    Recommendations for Other Services       Precautions / Restrictions Precautions Precautions: Knee Precaution Booklet Issued: No Restrictions Weight Bearing Restrictions: Yes RLE Weight Bearing: Weight bearing as tolerated      Mobility  Bed Mobility Overal bed mobility: Modified Independent                  Transfers Overall transfer level: Needs assistance Equipment used: Rolling walker (2 wheeled) Transfers: Sit to/from Stand Sit to Stand: Min guard         General transfer comment: effortful for pt, required use of BUE  Ambulation/Gait Ambulation/Gait assistance: Min guard Gait Distance (Feet): 40 Feet Assistive device:  Rolling walker (2 wheeled)       General Gait Details: antalgic, step to gait on RLE  Stairs            Wheelchair Mobility    Modified Rankin (Stroke Patients Only)       Balance Overall balance assessment: Needs assistance Sitting-balance support: Feet supported Sitting balance-Leahy Scale: Good       Standing balance-Leahy Scale: Fair Standing balance comment: reliant on UE support                             Pertinent Vitals/Pain Pain Assessment: 0-10 Pain Score: 2  Pain Location: R knee Pain Descriptors / Indicators: Aching;Sore Pain Intervention(s): Limited activity within patient's tolerance;Monitored during session;Repositioned;Ice applied    Home Living Family/patient expects to be discharged to:: Private residence Living Arrangements: Alone Available Help at Discharge:  (no help available) Type of Home: Mobile home Home Access: Ramped entrance     Home Layout: One level Home Equipment: Walker - 2 wheels;Grab bars - toilet;Grab bars - tub/shower      Prior Function Level of Independence: Independent               Hand Dominance        Extremity/Trunk Assessment   Upper Extremity Assessment Upper Extremity Assessment: Overall WFL for tasks assessed (pt reported L shoulder pain and recent injection in Feb)    Lower Extremity Assessment Lower Extremity Assessment: RLE deficits/detail;LLE deficits/detail RLE Deficits / Details: able to lift against gravity  with no issues LLE Deficits / Details: WFLs       Communication   Communication: HOH;Other (comment) (hearing aide)  Cognition Arousal/Alertness: Awake/alert Behavior During Therapy: WFL for tasks assessed/performed Overall Cognitive Status: Within Functional Limits for tasks assessed                                        General Comments      Exercises     Assessment/Plan    PT Assessment Patient needs continued PT services  PT Problem  List Decreased strength;Decreased mobility;Decreased range of motion;Decreased activity tolerance;Decreased balance;Pain       PT Treatment Interventions DME instruction;Therapeutic exercise;Gait training;Balance training;Neuromuscular re-education;Functional mobility training;Therapeutic activities;Patient/family education    PT Goals (Current goals can be found in the Care Plan section)  Acute Rehab PT Goals Patient Stated Goal: to go to rehab PT Goal Formulation: With patient Time For Goal Achievement: 01/02/21 Potential to Achieve Goals: Good    Frequency BID   Barriers to discharge Decreased caregiver support      Co-evaluation               AM-PAC PT "6 Clicks" Mobility  Outcome Measure Help needed turning from your back to your side while in a flat bed without using bedrails?: A Little Help needed moving from lying on your back to sitting on the side of a flat bed without using bedrails?: A Little Help needed moving to and from a bed to a chair (including a wheelchair)?: A Little Help needed standing up from a chair using your arms (e.g., wheelchair or bedside chair)?: A Little Help needed to walk in hospital room?: A Little Help needed climbing 3-5 steps with a railing? : A Lot 6 Click Score: 17    End of Session Equipment Utilized During Treatment: Gait belt Activity Tolerance: Patient tolerated treatment well Patient left: in chair;with chair alarm set;with call bell/phone within reach Nurse Communication: Mobility status PT Visit Diagnosis: Other abnormalities of gait and mobility (R26.89);Muscle weakness (generalized) (M62.81);Difficulty in walking, not elsewhere classified (R26.2);Pain Pain - Right/Left: Right Pain - part of body: Knee    Time: 2111-7356 PT Time Calculation (min) (ACUTE ONLY): 29 min   Charges:   PT Evaluation $PT Eval Low Complexity: 1 Low PT Treatments $Therapeutic Exercise: 23-37 mins      Lieutenant Diego PT, DPT 10:29  AM,12/19/20

## 2020-12-20 LAB — CBC
HCT: 39.2 % (ref 39.0–52.0)
Hemoglobin: 12.8 g/dL — ABNORMAL LOW (ref 13.0–17.0)
MCH: 30.6 pg (ref 26.0–34.0)
MCHC: 32.7 g/dL (ref 30.0–36.0)
MCV: 93.8 fL (ref 80.0–100.0)
Platelets: 187 10*3/uL (ref 150–400)
RBC: 4.18 MIL/uL — ABNORMAL LOW (ref 4.22–5.81)
RDW: 13.7 % (ref 11.5–15.5)
WBC: 13.7 10*3/uL — ABNORMAL HIGH (ref 4.0–10.5)
nRBC: 0 % (ref 0.0–0.2)

## 2020-12-20 LAB — SARS CORONAVIRUS 2 (TAT 6-24 HRS): SARS Coronavirus 2: NEGATIVE

## 2020-12-20 MED ORDER — DOCUSATE SODIUM 100 MG PO CAPS: 100.0000 mg | ORAL_CAPSULE | Freq: Two times a day (BID) | ORAL | 0 refills | Status: DC

## 2020-12-20 MED ORDER — HYDROCODONE-ACETAMINOPHEN 7.5-325 MG PO TABS: 1.0000 | ORAL_TABLET | ORAL | 0 refills | Status: DC | PRN

## 2020-12-20 MED ORDER — ASPIRIN 81 MG PO CHEW: 81.0000 mg | CHEWABLE_TABLET | Freq: Two times a day (BID) | ORAL | 0 refills | Status: AC

## 2020-12-20 NOTE — Discharge Instructions (Signed)
Continue weight bear as tolerated on the right lower extremity.    Elevate the right lower extremity whenever possible and continue the polar care while elevating the extremity. Patient may shower with aquacel. No bath or submerging the wound.    Take aspirin as directed for blood clot prevention.  Continue to work on knee range of motion exercises at home as instructed by physical therapy. Continue to use a walker for assistance with ambulation until cleared by physical therapy.  Call [phone] with any questions, such as fever > 101.5 degrees, drainage from the wound or shortness of breath.

## 2020-12-20 NOTE — Progress Notes (Signed)
Physical Therapy Treatment Patient Details Name: Aaron Valdez MRN: [id number] DOB: 01-03-1946 Today's Date: 12/20/2020    History of Present Illness Patient is a 75 yo male s/p R total knee revision, WBAT. PMH of DM, HTN, CKD, HLD, hiatal hernia.    PT Comments    Patient alert, agreeable to PT, supine in bed. Reported 6/10 knee pain but agreeable to PT session, RN notified after that pt requested pain medication. The patient was able to ambulated with CGA and RW ~25ft, decreased velocity and more effortful for pt this PM due to increased pain. Several exercises performed as well, physical assist as needed due to pain. Up in chair, all needs in reach at end of session. The patient would benefit from further skilled PT intervention to continue to progress towards goals. Recommendation remains appropriate.     Follow Up Recommendations  Follow surgeon's recommendation for DC plan and follow-up therapies;SNF     Equipment Recommendations  None recommended by PT    Recommendations for Other Services       Precautions / Restrictions Precautions Precautions: Knee Precaution Booklet Issued: No Restrictions Weight Bearing Restrictions: Yes RLE Weight Bearing: Weight bearing as tolerated    Mobility  Bed Mobility Overal bed mobility: Modified Independent                  Transfers Overall transfer level: Needs assistance Equipment used: Rolling walker (2 wheeled) Transfers: Sit to/from Stand Sit to Stand: Supervision         General transfer comment: effortful but declines assist, more difficult this PM due to pain  Ambulation/Gait Ambulation/Gait assistance: Min guard Gait Distance (Feet): 60 Feet Assistive device: Rolling walker (2 wheeled)       General Gait Details: Antalgic, pt reported that his knee did loosen up a little bit with ambulation but distance still limited due to increased pain   Stairs             Wheelchair Mobility    Modified  Rankin (Stroke Patients Only)       Balance Overall balance assessment: Needs assistance Sitting-balance support: Feet supported Sitting balance-Leahy Scale: Good     Standing balance support: Bilateral upper extremity supported Standing balance-Leahy Scale: Fair Standing balance comment: reliant on UE support for dynamic tasks                            Cognition Arousal/Alertness: Awake/alert Behavior During Therapy: WFL for tasks assessed/performed Overall Cognitive Status: Within Functional Limits for tasks assessed                                        Exercises Total Joint Exercises Heel Slides: AROM;Strengthening;15 reps;Right Hip ABduction/ADduction: AROM;Strengthening;Right;15 reps Long Arc Quad: 15 reps;AAROM;Right Knee Flexion: AROM;15 reps;Right Goniometric ROM: -3 degrees to 90degrees    General Comments        Pertinent Vitals/Pain Pain Assessment: 0-10 Pain Score: 6  Pain Location: R knee Pain Descriptors / Indicators: Aching;Sore Pain Intervention(s): Limited activity within patient's tolerance;Monitored during session;Repositioned;Patient requesting pain meds-RN notified    Home Living                      Prior Function            PT Goals (current goals can now be found in the care plan section)  Progress towards PT goals: Progressing toward goals    Frequency    BID      PT Plan Current plan remains appropriate    Co-evaluation              AM-PAC PT "6 Clicks" Mobility   Outcome Measure  Help needed turning from your back to your side while in a flat bed without using bedrails?: A Little Help needed moving from lying on your back to sitting on the side of a flat bed without using bedrails?: A Little Help needed moving to and from a bed to a chair (including a wheelchair)?: A Little Help needed standing up from a chair using your arms (e.g., wheelchair or bedside chair)?: A Little Help  needed to walk in hospital room?: A Little Help needed climbing 3-5 steps with a railing? : A Lot 6 Click Score: 17    End of Session Equipment Utilized During Treatment: Gait belt Activity Tolerance: Patient tolerated treatment well Patient left: with call bell/phone within reach;Other (comment);in chair;with chair alarm set Nurse Communication: Mobility status PT Visit Diagnosis: Other abnormalities of gait and mobility (R26.89);Muscle weakness (generalized) (M62.81);Difficulty in walking, not elsewhere classified (R26.2);Pain Pain - Right/Left: Right Pain - part of body: Knee     Time: 5831-6742 PT Time Calculation (min) (ACUTE ONLY): 18 min  Charges:  $Therapeutic Exercise: 8-22 mins                     Lieutenant Diego PT, DPT 3:13 PM,12/20/20

## 2020-12-20 NOTE — Progress Notes (Signed)
  Subjective:  Patient reports pain as mild to moderate.    Objective:   VITALS:   Vitals:   12/19/20 1615 12/19/20 1937 12/19/20 2346 12/20/20 0348  BP: 120/69 (!) 143/66 (!) 128/57 121/62  Pulse: 88 84 79 74  Resp: 16 18 16 16   Temp: 99.4 F (37.4 C) 98.4 F (36.9 C) 98.2 F (36.8 C) 98.1 F (36.7 C)  TempSrc:  Oral Oral Oral  SpO2: 95% 94% 96% 95%    PHYSICAL EXAM:  Neurologically intact ABD soft Neurovascular intact Sensation intact distally Intact pulses distally Dorsiflexion/Plantar flexion intact Incision: dressing C/D/I and aquacel placed No cellulitis present Compartment soft  LABS  Results for orders placed or performed during the hospital encounter of 12/18/20 (from the past 24 hour(s))  CBC     Status: Abnormal   Collection Time: 12/20/20  5:19 AM  Result Value Ref Range   WBC 13.7 (H) 4.0 - 10.5 K/uL   RBC 4.18 (L) 4.22 - 5.81 MIL/uL   Hemoglobin 12.8 (L) 13.0 - 17.0 g/dL   HCT 39.2 39.0 - 52.0 %   MCV 93.8 80.0 - 100.0 fL   MCH 30.6 26.0 - 34.0 pg   MCHC 32.7 30.0 - 36.0 g/dL   RDW 13.7 11.5 - 15.5 %   Platelets 187 150 - 400 K/uL   nRBC 0.0 0.0 - 0.2 %    DG Knee Right Port  Result Date: 12/18/2020 CLINICAL DATA:  Post RIGHT knee arthroplasty EXAM: PORTABLE RIGHT KNEE - 1-2 VIEW COMPARISON:  Portable exam 1423 hours compared to 12/31/2007 FINDINGS: Interval RIGHT total knee arthroplasty. Mild osseous demineralization. Joint alignment normal. No acute fracture, dislocation, bone destruction or periprosthetic lucency. Anterior skin clips and postsurgical changes of the soft tissues. IMPRESSION: RIGHT knee prosthesis without acute complication. Electronically Signed   By: Lavonia Dana M.D.   On: 12/18/2020 14:52    Assessment/Plan: 2 Days Post-Op   Active Problems:   History of total knee arthroplasty, right   Advance diet Up with therapy Discharge to SNF when bed available Follow up in 2 weeks for staple removal.  Call to confirm  appointment [phone].  Carlynn Spry , PA-C 12/20/2020, 6:26 AM

## 2020-12-20 NOTE — TOC Progression Note (Signed)
Transition of Care [facility]) - Progression Note    Patient Details  Name: Aaron Valdez MRN: [id number] Date of Birth: [birth year]  [facility] CM/SW [place], RN Phone Number: 12/20/2020, 10:47 AM  Clinical Narrative:   TOC in to speak with patient about discharge planing, patient amenable.  States he is agreeable to SNF rehab and has no preference as to location or facility.  Patient asked that I speak to his daughter via phone.  Discussed placement with daughter, and she chose [facility].  Patient is amenable to this facility.  [facility], [facility] contacted and stated she can accept patient tomorrow, pending insurance authorization and COVID test results..  Care team aware.  Insurance Auth started with [employer].    Patient is fully vaccinated, including booster but does not have his vaccination card with him.    Home:  Patient lives alone and has no concerns going home after rehab.  Neighbors and granddaughter ensure patient can get to appointments and can get medications and supplies as/if needed.  TOC contact information given to patient and daughter, TOC to follow through discharge.    Expected Discharge Plan: [place] Barriers to Discharge: Continued Medical Work up,SNF Pending bed offer  Expected Discharge Plan and Services Expected Discharge Plan: [place] In-house Referral: NA   Post Acute Care Choice: [place] Living arrangements for the past 2 months: Single Family Home Expected Discharge Date: 12/20/20                                     Social Determinants of Health (SDOH) Interventions    Readmission Risk Interventions No flowsheet data found.

## 2020-12-20 NOTE — Discharge Summary (Signed)
Physician Discharge Summary  Patient ID: Aaron Valdez MRN: [id number] DOB/AGE: [birth year] 76 y.o.  Admit date: 12/18/2020 Discharge date: 12/20/2020  Admission Diagnoses:  M17.11 Unilateral primary osteoarthritis, right knee <principal problem not specified>  Discharge Diagnoses:  M17.11 Unilateral primary osteoarthritis, right knee Active Problems:   History of total knee arthroplasty, right   Past Medical History:  Diagnosis Date  . Aortic aneurysm (HCC)    possible  . Arthritis    "ALL OVER"  . Chronic kidney disease    RENAL INSUFF  . Coronary artery disease   . Diabetes mellitus ([place]) 10/14/2016  . Dyspnea    WITH EXERTION  . Essential hypertension 10/14/2016  . History of hiatal hernia   . HOH (hard of hearing)   . Hyperlipidemia 10/14/2016  . Inguinal hernia   . Neuropathy   . Obesity 10/14/2016    Surgeries: Procedure(s): TOTAL KNEE ARTHROPLASTY on 12/18/2020   Consultants (if any):   Discharged Condition: Improved  Hospital Course: Aaron Valdez is an 75 y.o. male who was admitted 12/18/2020 with a diagnosis of  M17.11 Unilateral primary osteoarthritis, right knee <principal problem not specified> and went to the operating room on 12/18/2020 and underwent the above named procedures.    He was given perioperative antibiotics:  Anti-infectives (From admission, onward)   Start     Dose/Rate Route Frequency Ordered Stop   12/18/20 1800  ceFAZolin (ANCEF) IVPB 2g/100 mL premix        2 g 200 mL/hr over 30 Minutes Intravenous Every 6 hours 12/18/20 1626 12/19/20 0011   12/18/20 1053  ceFAZolin (ANCEF) 2-4 GM/100ML-% IVPB       Note to Pharmacy: Trudie Reed   : cabinet override      12/18/20 1053 12/18/20 1238   12/18/20 0600  ceFAZolin (ANCEF) IVPB 2g/100 mL premix        2 g 200 mL/hr over 30 Minutes Intravenous On call to O.R. 12/18/20 0320 12/18/20 1238    .  He was given sequential compression devices, early ambulation, and aspirin for DVT  prophylaxis.  He benefited maximally from the hospital stay and there were no complications.    Recent vital signs:  Vitals:   12/19/20 2346 12/20/20 0348  BP: (!) 128/57 121/62  Pulse: 79 74  Resp: 16 16  Temp: 98.2 F (36.8 C) 98.1 F (36.7 C)  SpO2: 96% 95%    Recent laboratory studies:  Lab Results  Component Value Date   HGB 12.8 (L) 12/20/2020   HGB 12.9 (L) 12/19/2020   HGB 14.4 12/08/2020   Lab Results  Component Value Date   WBC 13.7 (H) 12/20/2020   PLT 187 12/20/2020   Lab Results  Component Value Date   INR 1.0 12/08/2020   Lab Results  Component Value Date   NA 134 (L) 12/19/2020   K 4.2 12/19/2020   CL 103 12/19/2020   CO2 24 12/19/2020   BUN 21 12/19/2020   CREATININE 1.29 (H) 12/19/2020   GLUCOSE 178 (H) 12/19/2020    Discharge Medications:   Allergies as of 12/20/2020   No Known Allergies     Medication List    STOP taking these medications   aspirin EC 81 MG tablet Replaced by: aspirin 81 MG chewable tablet     TAKE these medications   acetaminophen 500 MG tablet Commonly known as: TYLENOL Take 1,500 mg by mouth every 6 (six) hours as needed for moderate pain or mild pain.   aspirin 81 MG chewable  tablet Chew 1 tablet (81 mg total) by mouth 2 (two) times daily. Replaces: aspirin EC 81 MG tablet   dapagliflozin propanediol 10 MG Tabs tablet Commonly known as: FARXIGA Take 10 mg by mouth daily.   docusate sodium 100 MG capsule Commonly known as: COLACE Take 1 capsule (100 mg total) by mouth 2 (two) times daily.   HYDROcodone-acetaminophen 7.5-325 MG tablet Commonly known as: NORCO Take 1 tablet by mouth every 4 (four) hours as needed for severe pain (pain score 7-10).   hydrocortisone 2.5 % rectal cream Commonly known as: ANUSOL-HC Place 1 application rectally 3 (three) times daily as needed for hemorrhoids.   LUBRICANT EYE DROPS OP Place 2 drops into both eyes daily as needed (for dry eyes).   multivitamin tablet Take  1 tablet by mouth daily.   Ozempic (1 MG/DOSE) 4 MG/3ML Sopn Generic drug: Semaglutide (1 MG/DOSE) Inject 1.5 mg into the skin once a week.   pioglitazone-metformin 15-850 MG tablet Commonly known as: ACTOPLUS MET Take 1 tablet by mouth 3 (three) times daily.   PROBIOTIC ACIDOPHILUS BEADS PO Take 1 capsule by mouth daily.   ramipril 10 MG capsule Commonly known as: ALTACE Take 10 mg by mouth daily.   rosuvastatin 10 MG tablet Commonly known as: CRESTOR Take 10 mg by mouth daily.   tizanidine 2 MG capsule Commonly known as: ZANAFLEX Take 2 mg by mouth daily.   Vitamin D (Cholecalciferol) 25 MCG (1000 UT) Tabs Take 1,000 Units by mouth daily.       Diagnostic Studies: DG Knee Right Port  Result Date: 12/18/2020 CLINICAL DATA:  Post RIGHT knee arthroplasty EXAM: PORTABLE RIGHT KNEE - 1-2 VIEW COMPARISON:  Portable exam 1423 hours compared to 12/31/2007 FINDINGS: Interval RIGHT total knee arthroplasty. Mild osseous demineralization. Joint alignment normal. No acute fracture, dislocation, bone destruction or periprosthetic lucency. Anterior skin clips and postsurgical changes of the soft tissues. IMPRESSION: RIGHT knee prosthesis without acute complication. Electronically Signed   By: Lavonia Dana M.D.   On: 12/18/2020 14:52    Disposition: Discharge disposition: 03-Skilled Nursing Facility            Signed: Carlynn Spry ,PA-C 12/20/2020, 6:32 AM

## 2020-12-20 NOTE — Progress Notes (Signed)
Physical Therapy Treatment Patient Details Name: Aaron Valdez MRN: [id number] DOB: Feb 18, 1946 Today's Date: 12/20/2020    History of Present Illness Patient is a 75 yo male s/p R total knee revision, WBAT. PMH of DM, HTN, CKD, HLD, hiatal hernia.    PT Comments    Pt alert, agreeable to PT, reported increased knee pain compared to previous sessions, premedicated. 8/10 with ambulation. The patient was able to perform exercises with verbal cues, assist for LAQ on RLE due to increased pain today. Supine to sit modI, and sit <> Stand with RW and supervision, effortful for pt from standard chair height. He ambulated ~65ft with RW and CGA, he did continue to exhibit step to gait pattern with increased antalgic gait. Up in chair at end of session, all needs in reach. The patient would benefit from further skilled PT intervention to continue to progress towards goals. Recommendation remains appropriate.       Follow Up Recommendations  Follow surgeon's recommendation for DC plan and follow-up therapies;SNF     Equipment Recommendations  None recommended by PT    Recommendations for Other Services       Precautions / Restrictions Precautions Precautions: Knee Precaution Booklet Issued: No Restrictions Weight Bearing Restrictions: Yes RLE Weight Bearing: Weight bearing as tolerated    Mobility  Bed Mobility Overal bed mobility: Modified Independent                  Transfers Overall transfer level: Needs assistance Equipment used: Rolling walker (2 wheeled) Transfers: Sit to/from Stand Sit to Stand: Supervision         General transfer comment: effortful but declines assist  Ambulation/Gait Ambulation/Gait assistance: Min guard Gait Distance (Feet): 60 Feet Assistive device: Rolling walker (2 wheeled)       General Gait Details: antalgic, step to gait on RLE, improved gait velocity noted though pt still limited and pt reported more pain this AM   Stairs              Wheelchair Mobility    Modified Rankin (Stroke Patients Only)       Balance Overall balance assessment: Needs assistance Sitting-balance support: Feet supported Sitting balance-Leahy Scale: Good     Standing balance support: Bilateral upper extremity supported Standing balance-Leahy Scale: Fair Standing balance comment: reliant on UE support for dynamic tasks                            Cognition Arousal/Alertness: Awake/alert Behavior During Therapy: WFL for tasks assessed/performed Overall Cognitive Status: Within Functional Limits for tasks assessed                                        Exercises Total Joint Exercises Ankle Circles/Pumps: AROM;Strengthening;Both;10 reps Quad Sets: AROM;Strengthening;Both;15 reps Short Arc Quad: AROM;Strengthening;Right;15 reps Long Arc Quad: 15 reps;AAROM;Right Knee Flexion: AROM;15 reps;Right    General Comments        Pertinent Vitals/Pain Pain Assessment: 0-10 Pain Score: 8  Pain Location: R knee Pain Descriptors / Indicators: Aching;Sore Pain Intervention(s): Limited activity within patient's tolerance;Monitored during session;Premedicated before session;Repositioned;Ice applied    Home Living                      Prior Function            PT Goals (current goals can now  be found in the care plan section) Progress towards PT goals: Progressing toward goals    Frequency    BID      PT Plan Current plan remains appropriate    Co-evaluation              AM-PAC PT "6 Clicks" Mobility   Outcome Measure  Help needed turning from your back to your side while in a flat bed without using bedrails?: A Little Help needed moving from lying on your back to sitting on the side of a flat bed without using bedrails?: A Little Help needed moving to and from a bed to a chair (including a wheelchair)?: A Little Help needed standing up from a chair using your arms  (e.g., wheelchair or bedside chair)?: A Little Help needed to walk in hospital room?: A Little Help needed climbing 3-5 steps with a railing? : A Lot 6 Click Score: 17    End of Session Equipment Utilized During Treatment: Gait belt Activity Tolerance: Patient tolerated treatment well Patient left: with call bell/phone within reach;Other (comment);in chair;with chair alarm set Nurse Communication: Mobility status PT Visit Diagnosis: Other abnormalities of gait and mobility (R26.89);Muscle weakness (generalized) (M62.81);Difficulty in walking, not elsewhere classified (R26.2);Pain Pain - Right/Left: Right Pain - part of body: Knee     Time: 5615-3794 PT Time Calculation (min) (ACUTE ONLY): 23 min  Charges:  $Therapeutic Exercise: 23-37 mins                     Lieutenant Diego PT, DPT 10:59 AM,12/20/20

## 2020-12-21 NOTE — Progress Notes (Signed)
  Subjective:  Patient reports pain as mild.    Objective:   VITALS:   Vitals:   12/20/20 1544 12/20/20 2040 12/21/20 0016 12/21/20 0430  BP: 117/63 (!) 124/58 (!) 106/54 121/72  Pulse: 85 74 75 68  Resp: 16 17 17 17   Temp: 97.9 F (36.6 C) (!) 97.5 F (36.4 C) 98 F (36.7 C) 97.9 F (36.6 C)  TempSrc:      SpO2: 92% 97% 96% 98%    PHYSICAL EXAM:  Neurologically intact ABD soft Neurovascular intact Sensation intact distally Intact pulses distally Dorsiflexion/Plantar flexion intact Incision: dressing C/D/I No cellulitis present Compartment soft  LABS  Results for orders placed or performed during the hospital encounter of 12/18/20 (from the past 24 hour(s))  SARS CORONAVIRUS 2 (TAT 6-24 HRS) Nasopharyngeal Nasopharyngeal Swab     Status: None   Collection Time: 12/20/20 11:15 AM   Specimen: Nasopharyngeal Swab  Result Value Ref Range   SARS Coronavirus 2 NEGATIVE NEGATIVE    No results found.  Assessment/Plan: 3 Days Post-Op   Active Problems:   History of total knee arthroplasty, right   Advance diet Up with therapy Discharge to SNFtoday Follow up in 2 weeks for staple removal.  Call to confirm appointment [phone].  Carlynn Spry , PA-C 12/21/2020, 7:29 AM

## 2020-12-21 NOTE — Care Management Important Message (Signed)
Important Message  Patient Details  Name: Aaron Valdez MRN: [id number] Date of Birth: April 06, 1946   Medicare Important Message Given:  N/A - LOS <3 / Initial given by admissions     [name] 12/21/2020, 8:25 AM

## 2020-12-21 NOTE — Discharge Summary (Signed)
Physician Discharge Summary  Patient ID: Aaron Valdez MRN: [id number] DOB/AGE: April 20, 1946 75 y.o.  Admit date: 12/18/2020 Discharge date: 12/21/2020  Admission Diagnoses:  M17.11 Unilateral primary osteoarthritis, right knee <principal problem not specified>  Discharge Diagnoses:  M17.11 Unilateral primary osteoarthritis, right knee Active Problems:   History of total knee arthroplasty, right   Past Medical History:  Diagnosis Date  . Aortic aneurysm (HCC)    possible  . Arthritis    "ALL OVER"  . Chronic kidney disease    RENAL INSUFF  . Coronary artery disease   . Diabetes mellitus ([place]) 10/14/2016  . Dyspnea    WITH EXERTION  . Essential hypertension 10/14/2016  . History of hiatal hernia   . HOH (hard of hearing)   . Hyperlipidemia 10/14/2016  . Inguinal hernia   . Neuropathy   . Obesity 10/14/2016    Surgeries: Procedure(s): TOTAL KNEE ARTHROPLASTY on 12/18/2020   Consultants (if any):   Discharged Condition: Improved  Hospital Course: Aaron Valdez is an 75 y.o. male who was admitted 12/18/2020 with a diagnosis of  M17.11 Unilateral primary osteoarthritis, right knee <principal problem not specified> and went to the operating room on 12/18/2020 and underwent the above named procedures.    He was given perioperative antibiotics:  Anti-infectives (From admission, onward)   Start     Dose/Rate Route Frequency Ordered Stop   12/18/20 1800  ceFAZolin (ANCEF) IVPB 2g/100 mL premix        2 g 200 mL/hr over 30 Minutes Intravenous Every 6 hours 12/18/20 1626 12/19/20 0011   12/18/20 1053  ceFAZolin (ANCEF) 2-4 GM/100ML-% IVPB       Note to Pharmacy: Trudie Reed   : cabinet override      12/18/20 1053 12/18/20 1238   12/18/20 0600  ceFAZolin (ANCEF) IVPB 2g/100 mL premix        2 g 200 mL/hr over 30 Minutes Intravenous On call to O.R. 12/18/20 0320 12/18/20 1238    .  He was given sequential compression devices, early ambulation, and aspirin for DVT  prophylaxis.  He benefited maximally from the hospital stay and there were no complications.    Recent vital signs:  Vitals:   12/21/20 0016 12/21/20 0430  BP: (!) 106/54 121/72  Pulse: 75 68  Resp: 17 17  Temp: 98 F (36.7 C) 97.9 F (36.6 C)  SpO2: 96% 98%    Recent laboratory studies:  Lab Results  Component Value Date   HGB 12.8 (L) 12/20/2020   HGB 12.9 (L) 12/19/2020   HGB 14.4 12/08/2020   Lab Results  Component Value Date   WBC 13.7 (H) 12/20/2020   PLT 187 12/20/2020   Lab Results  Component Value Date   INR 1.0 12/08/2020   Lab Results  Component Value Date   NA 134 (L) 12/19/2020   K 4.2 12/19/2020   CL 103 12/19/2020   CO2 24 12/19/2020   BUN 21 12/19/2020   CREATININE 1.29 (H) 12/19/2020   GLUCOSE 178 (H) 12/19/2020    Discharge Medications:   Allergies as of 12/21/2020   No Known Allergies     Medication List    STOP taking these medications   aspirin EC 81 MG tablet Replaced by: aspirin 81 MG chewable tablet     TAKE these medications   acetaminophen 500 MG tablet Commonly known as: TYLENOL Take 1,500 mg by mouth every 6 (six) hours as needed for moderate pain or mild pain.   aspirin 81 MG chewable  tablet Chew 1 tablet (81 mg total) by mouth 2 (two) times daily. Replaces: aspirin EC 81 MG tablet   dapagliflozin propanediol 10 MG Tabs tablet Commonly known as: FARXIGA Take 10 mg by mouth daily.   docusate sodium 100 MG capsule Commonly known as: COLACE Take 1 capsule (100 mg total) by mouth 2 (two) times daily.   HYDROcodone-acetaminophen 7.5-325 MG tablet Commonly known as: NORCO Take 1 tablet by mouth every 4 (four) hours as needed for severe pain (pain score 7-10).   hydrocortisone 2.5 % rectal cream Commonly known as: ANUSOL-HC Place 1 application rectally 3 (three) times daily as needed for hemorrhoids.   LUBRICANT EYE DROPS OP Place 2 drops into both eyes daily as needed (for dry eyes).   multivitamin tablet Take 1  tablet by mouth daily.   Ozempic (1 MG/DOSE) 4 MG/3ML Sopn Generic drug: Semaglutide (1 MG/DOSE) Inject 1.5 mg into the skin once a week.   pioglitazone-metformin 15-850 MG tablet Commonly known as: ACTOPLUS MET Take 1 tablet by mouth 3 (three) times daily.   PROBIOTIC ACIDOPHILUS BEADS PO Take 1 capsule by mouth daily.   ramipril 10 MG capsule Commonly known as: ALTACE Take 10 mg by mouth daily.   rosuvastatin 10 MG tablet Commonly known as: CRESTOR Take 10 mg by mouth daily.   tizanidine 2 MG capsule Commonly known as: ZANAFLEX Take 2 mg by mouth daily.   Vitamin D (Cholecalciferol) 25 MCG (1000 UT) Tabs Take 1,000 Units by mouth daily.       Diagnostic Studies: DG Knee Right Port  Result Date: 12/18/2020 CLINICAL DATA:  Post RIGHT knee arthroplasty EXAM: PORTABLE RIGHT KNEE - 1-2 VIEW COMPARISON:  Portable exam 1423 hours compared to 12/31/2007 FINDINGS: Interval RIGHT total knee arthroplasty. Mild osseous demineralization. Joint alignment normal. No acute fracture, dislocation, bone destruction or periprosthetic lucency. Anterior skin clips and postsurgical changes of the soft tissues. IMPRESSION: RIGHT knee prosthesis without acute complication. Electronically Signed   By: Lavonia Dana M.D.   On: 12/18/2020 14:52    Disposition: Discharge disposition: 03-Skilled [place] information for after-discharge care    Destination    [facility]erred [facility] .   Service: Skilled Nursing Contact information: [address] [place] [phone]                   Signed: Carlynn Spry ,PA-C 12/21/2020, 7:30 AM

## 2020-12-21 NOTE — Progress Notes (Signed)
Physical Therapy Treatment Patient Details Name: Aaron Valdez MRN: [id number] DOB: June 17, 1946 Today's Date: 12/21/2020    History of Present Illness Patient is a 75 yo male s/p R total knee revision, WBAT. PMH of DM, HTN, CKD, HLD, hiatal hernia.    PT Comments    Pt alert, in bathroom with RW upon PT entering the room. Sit <> stand transfer with RW and supervision, effortful for pt. He ambulated ~44ft to return to bed, modI to return to supine. Pt declining further mobility or exercises at this time due to fatigue, wanting to sleep, and elevated pain levels. The patient would benefit from further skilled PT intervention to continue to progress towards goals. Recommendation remains appropriate.     Follow Up Recommendations  Follow surgeon's recommendation for DC plan and follow-up therapies;SNF     Equipment Recommendations  None recommended by PT    Recommendations for Other Services       Precautions / Restrictions Precautions Precautions: Knee Precaution Booklet Issued: No Restrictions Weight Bearing Restrictions: Yes RLE Weight Bearing: Weight bearing as tolerated    Mobility  Bed Mobility Overal bed mobility: Modified Independent                  Transfers Overall transfer level: Needs assistance Equipment used: Rolling walker (2 wheeled) Transfers: Sit to/from Stand Sit to Stand: Supervision         General transfer comment: pt up in bathroom upon PT entering room  Ambulation/Gait Ambulation/Gait assistance: Min guard Gait Distance (Feet): 12 Feet Assistive device: Rolling walker (2 wheeled)       General Gait Details: Pt reported increased pain, declined further mobility due to fatigue and pain levels   Stairs             Wheelchair Mobility    Modified Rankin (Stroke Patients Only)       Balance Overall balance assessment: Needs assistance Sitting-balance support: Feet supported Sitting balance-Leahy Scale: Good      Standing balance support: Bilateral upper extremity supported Standing balance-Leahy Scale: Fair Standing balance comment: reliant on UE support for dynamic tasks                            Cognition Arousal/Alertness: Awake/alert Behavior During Therapy: WFL for tasks assessed/performed Overall Cognitive Status: Within Functional Limits for tasks assessed                                        Exercises Total Joint Exercises Ankle Circles/Pumps: AROM;Both;20 reps Quad Sets: AROM;Strengthening;Both;20 reps Long Arc Quad: AAROM;Right;10 reps Knee Flexion: AROM;Right;20 reps    General Comments        Pertinent Vitals/Pain Pain Assessment: 0-10 Pain Score: 4  Pain Location: R knee Pain Descriptors / Indicators: Aching;Sore Pain Intervention(s): Limited activity within patient's tolerance;Monitored during session;Repositioned;Patient requesting pain meds-RN notified;Ice applied    Home Living                      Prior Function            PT Goals (current goals can now be found in the care plan section) Progress towards PT goals: Progressing toward goals    Frequency    BID      PT Plan Current plan remains appropriate    Co-evaluation  AM-PAC PT "6 Clicks" Mobility   Outcome Measure  Help needed turning from your back to your side while in a flat bed without using bedrails?: A Little Help needed moving from lying on your back to sitting on the side of a flat bed without using bedrails?: A Little Help needed moving to and from a bed to a chair (including a wheelchair)?: A Little Help needed standing up from a chair using your arms (e.g., wheelchair or bedside chair)?: A Little Help needed to walk in hospital room?: A Little Help needed climbing 3-5 steps with a railing? : A Lot 6 Click Score: 17    End of Session Equipment Utilized During Treatment: Gait belt Activity Tolerance: Patient limited by  fatigue;Patient limited by pain Patient left: with call bell/phone within reach;Other (comment);in chair;with chair alarm set Nurse Communication: Mobility status PT Visit Diagnosis: Other abnormalities of gait and mobility (R26.89);Muscle weakness (generalized) (M62.81);Difficulty in walking, not elsewhere classified (R26.2);Pain Pain - Right/Left: Right Pain - part of body: Knee     Time: 9924-2683 PT Time Calculation (min) (ACUTE ONLY): 10 min  Charges:  $Therapeutic Exercise: 23-37 mins $Therapeutic Activity: 8-22 mins                     {Florencia Zaccaro Megan Salon PT, DPT 2:36 PM,12/21/20

## 2020-12-21 NOTE — TOC Progression Note (Addendum)
Transition of Care [facility]) - Progression Note    Patient Details  Name: Aaron Valdez MRN: [id number] Date of Birth: October 05, 1945  Transition of Care [facility]) CM/SW [place], RN Phone Number: 12/21/2020, 10:59 AM  Clinical Narrative:   Addendum at 202pm:  [facility] ref [phone], approval is pending, will not need another peer review.  TOC at bedside. Patient states he is doing well.  Continues to request daughter to choose SNF.  Originally patient and daughter agreed on [facility] who offered patient a bed, however [facility] states they cannot take patient until late next week.  Daughter Maudie Mercury was contacted, requested [facility]  [facility], [facility] is pending response.  TOC will follow until discharge.    Expected Discharge Plan: [place] Barriers to Discharge: Continued Medical Work up,SNF Pending bed offer  Expected Discharge Plan and Services Expected Discharge Plan: [place] In-house Referral: NA   Post Acute Care Choice: [place] Living arrangements for the past 2 months: Single Family Home Expected Discharge Date: 12/21/20                                     Social Determinants of Health (SDOH) Interventions    Readmission Risk Interventions No flowsheet data found.

## 2020-12-21 NOTE — Progress Notes (Signed)
Physical Therapy Treatment Patient Details Name: Aaron Valdez MRN: [id number] DOB: [birth year] Today's Date: 12/21/2020    History of Present Illness Patient is a 75 yo male s/p R total knee revision, WBAT. PMH of DM, HTN, CKD, HLD, hiatal hernia.    PT Comments    Patient alert, agreeable to PT. Reported 3/10 R knee pain prior to therapy, at end of session 4-5/10 pain and RN notified of pt request for pain medication. He was able to perform several exercises with verbal/tactile cueing. Supine <> sit modI, and sit <> stand with RW and supervision. Still very effortful for pt and reliant on UE support to complete. He ambulated ~62ft with RW and CGA, decreased gait velocity and antalgic gait noted on RLE. Pt up in chair with all needs in reach at end of session. The patient would benefit from further skilled PT intervention to continue to progress towards goals. Recommendation remains appropriate to maximize pt safety, independence, and mobility.     Follow Up Recommendations  Follow surgeon's recommendation for DC plan and follow-up therapies;SNF     Equipment Recommendations  None recommended by PT    Recommendations for Other Services       Precautions / Restrictions Precautions Precautions: Knee Precaution Booklet Issued: No Restrictions Weight Bearing Restrictions: Yes RLE Weight Bearing: Weight bearing as tolerated    Mobility  Bed Mobility Overal bed mobility: Modified Independent                  Transfers Overall transfer level: Needs assistance Equipment used: Rolling walker (2 wheeled) Transfers: Sit to/from Stand Sit to Stand: Supervision         General transfer comment: effortful but declines assist, reliant on UE support  Ambulation/Gait Ambulation/Gait assistance: Min guard Gait Distance (Feet): 55 Feet Assistive device: Rolling walker (2 wheeled)       General Gait Details: Antalgic, pt reported that his knee did loosen up a little bit with  ambulation but distance still limited due to increased pain   Stairs             Wheelchair Mobility    Modified Rankin (Stroke Patients Only)       Balance Overall balance assessment: Needs assistance Sitting-balance support: Feet supported Sitting balance-Leahy Scale: Good     Standing balance support: Bilateral upper extremity supported Standing balance-Leahy Scale: Fair Standing balance comment: reliant on UE support for dynamic tasks                            Cognition Arousal/Alertness: Awake/alert Behavior During Therapy: WFL for tasks assessed/performed Overall Cognitive Status: Within Functional Limits for tasks assessed                                        Exercises Total Joint Exercises Ankle Circles/Pumps: AROM;Both;20 reps Quad Sets: AROM;Strengthening;Both;20 reps Long Arc Quad: AAROM;Right;10 reps Knee Flexion: AROM;Right;20 reps    General Comments        Pertinent Vitals/Pain Pain Assessment: 0-10 Pain Score: 4  Pain Location: R knee Pain Descriptors / Indicators: Aching;Sore Pain Intervention(s): Limited activity within patient's tolerance;Monitored during session;Repositioned;Patient requesting pain meds-RN notified;Ice applied    Home Living                      Prior Function  PT Goals (current goals can now be found in the care plan section) Progress towards PT goals: Progressing toward goals    Frequency    BID      PT Plan Current plan remains appropriate    Co-evaluation              AM-PAC PT "6 Clicks" Mobility   Outcome Measure  Help needed turning from your back to your side while in a flat bed without using bedrails?: A Little Help needed moving from lying on your back to sitting on the side of a flat bed without using bedrails?: A Little Help needed moving to and from a bed to a chair (including a wheelchair)?: A Little Help needed standing up from a  chair using your arms (e.g., wheelchair or bedside chair)?: A Little Help needed to walk in hospital room?: A Little Help needed climbing 3-5 steps with a railing? : A Lot 6 Click Score: 17    End of Session Equipment Utilized During Treatment: Gait belt Activity Tolerance: Patient tolerated treatment well Patient left: with call bell/phone within reach;Other (comment);in chair;with chair alarm set Nurse Communication: Mobility status PT Visit Diagnosis: Other abnormalities of gait and mobility (R26.89);Muscle weakness (generalized) (M62.81);Difficulty in walking, not elsewhere classified (R26.2);Pain Pain - Right/Left: Right Pain - part of body: Knee     Time: 1016-1040 PT Time Calculation (min) (ACUTE ONLY): 24 min  Charges:  $Therapeutic Exercise: 23-37 mins                     Lieutenant Diego PT, DPT 11:40 AM,12/21/20

## 2020-12-22 DIAGNOSIS — N183 Chronic kidney disease, stage 3 unspecified: Secondary | ICD-10-CM | POA: Diagnosis not present

## 2020-12-22 DIAGNOSIS — K5900 Constipation, unspecified: Secondary | ICD-10-CM | POA: Diagnosis not present

## 2020-12-22 DIAGNOSIS — E1140 Type 2 diabetes mellitus with diabetic neuropathy, unspecified: Secondary | ICD-10-CM | POA: Diagnosis not present

## 2020-12-22 DIAGNOSIS — M1711 Unilateral primary osteoarthritis, right knee: Secondary | ICD-10-CM | POA: Diagnosis not present

## 2020-12-22 DIAGNOSIS — N189 Chronic kidney disease, unspecified: Secondary | ICD-10-CM | POA: Diagnosis not present

## 2020-12-22 DIAGNOSIS — R2681 Unsteadiness on feet: Secondary | ICD-10-CM | POA: Diagnosis not present

## 2020-12-22 DIAGNOSIS — M6281 Muscle weakness (generalized): Secondary | ICD-10-CM | POA: Diagnosis not present

## 2020-12-22 DIAGNOSIS — I10 Essential (primary) hypertension: Secondary | ICD-10-CM | POA: Diagnosis not present

## 2020-12-22 DIAGNOSIS — Z719 Counseling, unspecified: Secondary | ICD-10-CM | POA: Diagnosis not present

## 2020-12-22 DIAGNOSIS — E0822 Diabetes mellitus due to underlying condition with diabetic chronic kidney disease: Secondary | ICD-10-CM | POA: Diagnosis not present

## 2020-12-22 DIAGNOSIS — I2510 Atherosclerotic heart disease of native coronary artery without angina pectoris: Secondary | ICD-10-CM | POA: Diagnosis not present

## 2020-12-22 DIAGNOSIS — R279 Unspecified lack of coordination: Secondary | ICD-10-CM | POA: Diagnosis not present

## 2020-12-22 DIAGNOSIS — Z471 Aftercare following joint replacement surgery: Secondary | ICD-10-CM | POA: Diagnosis not present

## 2020-12-22 DIAGNOSIS — D509 Iron deficiency anemia, unspecified: Secondary | ICD-10-CM | POA: Diagnosis not present

## 2020-12-22 DIAGNOSIS — R52 Pain, unspecified: Secondary | ICD-10-CM | POA: Diagnosis not present

## 2020-12-22 DIAGNOSIS — R5381 Other malaise: Secondary | ICD-10-CM | POA: Diagnosis not present

## 2020-12-22 DIAGNOSIS — M179 Osteoarthritis of knee, unspecified: Secondary | ICD-10-CM | POA: Diagnosis not present

## 2020-12-22 DIAGNOSIS — E785 Hyperlipidemia, unspecified: Secondary | ICD-10-CM | POA: Diagnosis not present

## 2020-12-22 NOTE — Care Management Important Message (Signed)
Important Message  Patient Details  Name: Aaron Valdez MRN: [id number] Date of Birth: 09/14/1946   Medicare Important Message Given:  Yes     Aaron Valdez 12/22/2020, 9:40 AM

## 2020-12-22 NOTE — TOC Transition Note (Addendum)
Transition of Care [facility]) - CM/SW Discharge Note   Patient Details  Name: Aaron Valdez MRN: [id number] Date of Birth: [birth year]  Transition of Care [facility]) CM/SW Contact:  Pete Pelt, RN Phone Number: 12/22/2020, 12:47 PM   Clinical Narrative: [facility] going to [facility] today.  Message left for daughter.  Patient aware and amenable.   [facility] picking patient up at 2pm.  Message left for daughter.  NO further questions     Final next level of care: Skilled Nursing Facility Barriers to Discharge: Barriers Resolved   Patient Goals and CMS Choice     Choice offered to / list presented to : Aaron Valdez  Discharge Placement PASRR number recieved: 12/20/20            Patient chooses bed at: [facility] [place] Patient to be transferred to facility by: Ambulance Name of family member notified: Aaron Valdez, Aaron Valdez (Daughter)   [phone] [facility] Phone), left voicemail Patient and family notified of of transfer: 12/22/20  Discharge Plan and Services In-house Referral: NA   Post Acute Care Choice: [place]          DME Arranged:  (n/a)         HH Arranged:  (n/a going to SNF)          Social Determinants of Health (SDOH) Interventions     Readmission Risk Interventions No flowsheet data found.

## 2020-12-22 NOTE — Progress Notes (Signed)
Physical Therapy Treatment Patient Details Name: Aaron Valdez MRN: [id number] DOB: 07/01/1946 Today's Date: 12/22/2020    History of Present Illness Patient is a 75 yo male s/p R total knee revision, WBAT. PMH of DM, HTN, CKD, HLD, hiatal hernia.    PT Comments    Patient alert, agreeable to PT. Reported 4/10 R knee pain, requested pain medication at end of session and RN notified. The patient demonstrated bed mobility modI. Several seated exercises performed with verbal cues. Sit <> stand with RW and supervision, effortful and painful today per pt but did not need physical assist. He was able to ambulate with RW and CGA, did exhibit improved step height/length, but with increased distance pt reported fatigue and increased pain. Up in chair at end of session, all needs in reach. The patient would benefit from further skilled PT intervention to continue to progress towards goals. Recommendation remains appropriate.    Follow Up Recommendations  Follow surgeon's recommendation for DC plan and follow-up therapies;SNF     Equipment Recommendations  None recommended by PT    Recommendations for Other Services       Precautions / Restrictions Precautions Precautions: Knee Precaution Booklet Issued: No Restrictions Weight Bearing Restrictions: Yes RLE Weight Bearing: Weight bearing as tolerated    Mobility  Bed Mobility Overal bed mobility: Modified Independent                  Transfers Overall transfer level: Needs assistance   Transfers: Sit to/from Stand Sit to Stand: Supervision         General transfer comment: effortful for patient, required UE support  Ambulation/Gait Ambulation/Gait assistance: Min guard   Assistive device: Rolling walker (2 wheeled)       General Gait Details: Pt able to ambulate at least 75ft with RW and CGA. improved step height/length initially   Stairs             Wheelchair Mobility    Modified Rankin (Stroke Patients  Only)       Balance Overall balance assessment: Needs assistance Sitting-balance support: Feet supported Sitting balance-Leahy Scale: Good     Standing balance support: Bilateral upper extremity supported Standing balance-Leahy Scale: Fair Standing balance comment: reliant on UE support for dynamic tasks                            Cognition Arousal/Alertness: Awake/alert Behavior During Therapy: WFL for tasks assessed/performed Overall Cognitive Status: Within Functional Limits for tasks assessed                                        Exercises Total Joint Exercises Ankle Circles/Pumps: AROM;Both;20 reps Quad Sets: AROM;Strengthening;Both;20 reps Hip ABduction/ADduction: AROM;Strengthening;Right;20 reps Long Arc Quad: Right;10 reps Knee Flexion: AROM;Right;20 reps    General Comments        Pertinent Vitals/Pain Pain Assessment: 0-10 Pain Score: 3  Pain Location: R knee Pain Descriptors / Indicators: Aching;Sore Pain Intervention(s): Limited activity within patient's tolerance;Monitored during session;Repositioned;Patient requesting pain meds-RN notified;Ice applied    Home Living                      Prior Function            PT Goals (current goals can now be found in the care plan section) Progress towards PT goals: Progressing toward  goals    Frequency    BID      PT Plan Current plan remains appropriate    Co-evaluation              AM-PAC PT "6 Clicks" Mobility   Outcome Measure  Help needed turning from your back to your side while in a flat bed without using bedrails?: A Little Help needed moving from lying on your back to sitting on the side of a flat bed without using bedrails?: A Little Help needed moving to and from a bed to a chair (including a wheelchair)?: A Little Help needed standing up from a chair using your arms (e.g., wheelchair or bedside chair)?: A Little Help needed to walk in  hospital room?: A Little Help needed climbing 3-5 steps with a railing? : A Lot 6 Click Score: 17    End of Session Equipment Utilized During Treatment: Gait belt Activity Tolerance: Patient limited by fatigue;Patient limited by pain Patient left: with call bell/phone within reach;Other (comment);in chair;with chair alarm set Nurse Communication: Mobility status PT Visit Diagnosis: Other abnormalities of gait and mobility (R26.89);Muscle weakness (generalized) (M62.81);Difficulty in walking, not elsewhere classified (R26.2);Pain Pain - Right/Left: Right Pain - part of body: Knee     Time: 1586-8257 PT Time Calculation (min) (ACUTE ONLY): 18 min  Charges:  $Therapeutic Exercise: 8-22 mins                     Lieutenant Diego PT, DPT 11:38 AM,12/22/20

## 2020-12-22 NOTE — Discharge Summary (Signed)
Physician Discharge Summary  Patient ID: Aaron Valdez MRN: [id number] DOB/AGE: 27-Aug-1946 75 y.o.  Admit date: 12/18/2020 Discharge date: 12/22/2020  Admission Diagnoses:  M17.11 Unilateral primary osteoarthritis, right knee <principal problem not specified>  Discharge Diagnoses:  M17.11 Unilateral primary osteoarthritis, right knee Active Problems:   History of total knee arthroplasty, right   Past Medical History:  Diagnosis Date  . Aortic aneurysm (HCC)    possible  . Arthritis    "ALL OVER"  . Chronic kidney disease    RENAL INSUFF  . Coronary artery disease   . Diabetes mellitus ([place]) 10/14/2016  . Dyspnea    WITH EXERTION  . Essential hypertension 10/14/2016  . History of hiatal hernia   . HOH (hard of hearing)   . Hyperlipidemia 10/14/2016  . Inguinal hernia   . Neuropathy   . Obesity 10/14/2016    Surgeries: Procedure(s): TOTAL KNEE ARTHROPLASTY on 12/18/2020   Consultants (if any):   Discharged Condition: Improved  Hospital Course: Aaron Valdez is an 75 y.o. male who was admitted 12/18/2020 with a diagnosis of  M17.11 Unilateral primary osteoarthritis, right knee <principal problem not specified> and went to the operating room on 12/18/2020 and underwent the above named procedures.    He was given perioperative antibiotics:  Anti-infectives (From admission, onward)   Start     Dose/Rate Route Frequency Ordered Stop   12/18/20 1800  ceFAZolin (ANCEF) IVPB 2g/100 mL premix        2 g 200 mL/hr over 30 Minutes Intravenous Every 6 hours 12/18/20 1626 12/19/20 0011   12/18/20 1053  ceFAZolin (ANCEF) 2-4 GM/100ML-% IVPB       Note to Pharmacy: Trudie Reed   : cabinet override      12/18/20 1053 12/18/20 1238   12/18/20 0600  ceFAZolin (ANCEF) IVPB 2g/100 mL premix        2 g 200 mL/hr over 30 Minutes Intravenous On call to O.R. 12/18/20 0320 12/18/20 1238    .  He was given sequential compression devices, early ambulation, and aspirin for DVT  prophylaxis.  He benefited maximally from the hospital stay and there were no complications.    Recent vital signs:  Vitals:   12/22/20 0443 12/22/20 0744  BP: 129/80 121/63  Pulse: 71 66  Resp: 17 16  Temp: 97.9 F (36.6 C) 98.4 F (36.9 C)  SpO2: 96% 96%    Recent laboratory studies:  Lab Results  Component Value Date   HGB 12.8 (L) 12/20/2020   HGB 12.9 (L) 12/19/2020   HGB 14.4 12/08/2020   Lab Results  Component Value Date   WBC 13.7 (H) 12/20/2020   PLT 187 12/20/2020   Lab Results  Component Value Date   INR 1.0 12/08/2020   Lab Results  Component Value Date   NA 134 (L) 12/19/2020   K 4.2 12/19/2020   CL 103 12/19/2020   CO2 24 12/19/2020   BUN 21 12/19/2020   CREATININE 1.29 (H) 12/19/2020   GLUCOSE 178 (H) 12/19/2020    Discharge Medications:   Allergies as of 12/22/2020   No Known Allergies     Medication List    STOP taking these medications   aspirin EC 81 MG tablet Replaced by: aspirin 81 MG chewable tablet     TAKE these medications   acetaminophen 500 MG tablet Commonly known as: TYLENOL Take 1,500 mg by mouth every 6 (six) hours as needed for moderate pain or mild pain.   aspirin 81 MG chewable tablet  Chew 1 tablet (81 mg total) by mouth 2 (two) times daily. Replaces: aspirin EC 81 MG tablet   dapagliflozin propanediol 10 MG Tabs tablet Commonly known as: FARXIGA Take 10 mg by mouth daily.   docusate sodium 100 MG capsule Commonly known as: COLACE Take 1 capsule (100 mg total) by mouth 2 (two) times daily.   HYDROcodone-acetaminophen 7.5-325 MG tablet Commonly known as: NORCO Take 1 tablet by mouth every 4 (four) hours as needed for severe pain (pain score 7-10).   hydrocortisone 2.5 % rectal cream Commonly known as: ANUSOL-HC Place 1 application rectally 3 (three) times daily as needed for hemorrhoids.   LUBRICANT EYE DROPS OP Place 2 drops into both eyes daily as needed (for dry eyes).   multivitamin tablet Take 1  tablet by mouth daily.   Ozempic (1 MG/DOSE) 4 MG/3ML Sopn Generic drug: Semaglutide (1 MG/DOSE) Inject 1.5 mg into the skin once a week.   pioglitazone-metformin 15-850 MG tablet Commonly known as: ACTOPLUS MET Take 1 tablet by mouth 3 (three) times daily.   PROBIOTIC ACIDOPHILUS BEADS PO Take 1 capsule by mouth daily.   ramipril 10 MG capsule Commonly known as: ALTACE Take 10 mg by mouth daily.   rosuvastatin 10 MG tablet Commonly known as: CRESTOR Take 10 mg by mouth daily.   tizanidine 2 MG capsule Commonly known as: ZANAFLEX Take 2 mg by mouth daily.   Vitamin D (Cholecalciferol) 25 MCG (1000 UT) Tabs Take 1,000 Units by mouth daily.       Diagnostic Studies: DG Knee Right Port  Result Date: 12/18/2020 CLINICAL DATA:  Post RIGHT knee arthroplasty EXAM: PORTABLE RIGHT KNEE - 1-2 VIEW COMPARISON:  Portable exam 1423 hours compared to 12/31/2007 FINDINGS: Interval RIGHT total knee arthroplasty. Mild osseous demineralization. Joint alignment normal. No acute fracture, dislocation, bone destruction or periprosthetic lucency. Anterior skin clips and postsurgical changes of the soft tissues. IMPRESSION: RIGHT knee prosthesis without acute complication. Electronically Signed   By: Lavonia Dana M.D.   On: 12/18/2020 14:52    Disposition: Discharge disposition: 03-Skilled [place] information for after-discharge care    Destination    [facility] Preferred SNF .   Service: Skilled Nursing Contact information: [address] [place] [phone]                   Signed: Carlynn Spry ,PA-C 12/22/2020, 8:29 AM

## 2020-12-22 NOTE — Progress Notes (Signed)
  Subjective:  Patient reports pain as mild to moderate.    Objective:   VITALS:   Vitals:   12/21/20 2039 12/22/20 0009 12/22/20 0443 12/22/20 0744  BP: 124/62 125/71 129/80 121/63  Pulse: 85 82 71 66  Resp: 17 18 17 16   Temp: 98.7 F (37.1 C) 98.3 F (36.8 C) 97.9 F (36.6 C) 98.4 F (36.9 C)  TempSrc:      SpO2: 93% 95% 96% 96%    PHYSICAL EXAM:  Neurologically intact ABD soft Neurovascular intact Sensation intact distally Intact pulses distally Dorsiflexion/Plantar flexion intact Incision: dressing C/D/I No cellulitis present Compartment soft  LABS  No results found for this or any previous visit (from the past 24 hour(s)).  No results found.  Assessment/Plan: 4 Days Post-Op   Active Problems:   History of total knee arthroplasty, right   Advance diet Up with therapy Discharge to SNFwhen bed available Follow up in 2 weeks for staple removal. Call to confirm appointment [phone].   Carlynn Spry , PA-C 12/22/2020, 8:27 AM

## 2020-12-23 DIAGNOSIS — I10 Essential (primary) hypertension: Secondary | ICD-10-CM | POA: Diagnosis not present

## 2020-12-23 DIAGNOSIS — E1140 Type 2 diabetes mellitus with diabetic neuropathy, unspecified: Secondary | ICD-10-CM | POA: Diagnosis not present

## 2020-12-23 DIAGNOSIS — E785 Hyperlipidemia, unspecified: Secondary | ICD-10-CM | POA: Diagnosis not present

## 2020-12-23 DIAGNOSIS — I2510 Atherosclerotic heart disease of native coronary artery without angina pectoris: Secondary | ICD-10-CM | POA: Diagnosis not present

## 2020-12-23 DIAGNOSIS — N189 Chronic kidney disease, unspecified: Secondary | ICD-10-CM | POA: Diagnosis not present

## 2020-12-25 DIAGNOSIS — N183 Chronic kidney disease, stage 3 unspecified: Secondary | ICD-10-CM | POA: Diagnosis not present

## 2020-12-25 DIAGNOSIS — I2510 Atherosclerotic heart disease of native coronary artery without angina pectoris: Secondary | ICD-10-CM | POA: Diagnosis not present

## 2020-12-25 DIAGNOSIS — E1140 Type 2 diabetes mellitus with diabetic neuropathy, unspecified: Secondary | ICD-10-CM | POA: Diagnosis not present

## 2020-12-25 DIAGNOSIS — M179 Osteoarthritis of knee, unspecified: Secondary | ICD-10-CM | POA: Diagnosis not present

## 2021-01-02 DIAGNOSIS — M179 Osteoarthritis of knee, unspecified: Secondary | ICD-10-CM | POA: Diagnosis not present

## 2021-01-02 DIAGNOSIS — I10 Essential (primary) hypertension: Secondary | ICD-10-CM | POA: Diagnosis not present

## 2021-01-02 DIAGNOSIS — E1140 Type 2 diabetes mellitus with diabetic neuropathy, unspecified: Secondary | ICD-10-CM | POA: Diagnosis not present

## 2021-01-02 DIAGNOSIS — I2510 Atherosclerotic heart disease of native coronary artery without angina pectoris: Secondary | ICD-10-CM | POA: Diagnosis not present

## 2021-01-05 DIAGNOSIS — I2510 Atherosclerotic heart disease of native coronary artery without angina pectoris: Secondary | ICD-10-CM | POA: Diagnosis not present

## 2021-01-05 DIAGNOSIS — N189 Chronic kidney disease, unspecified: Secondary | ICD-10-CM | POA: Diagnosis not present

## 2021-01-05 DIAGNOSIS — H9190 Unspecified hearing loss, unspecified ear: Secondary | ICD-10-CM | POA: Diagnosis not present

## 2021-01-05 DIAGNOSIS — Z96651 Presence of right artificial knee joint: Secondary | ICD-10-CM | POA: Diagnosis not present

## 2021-01-05 DIAGNOSIS — E1122 Type 2 diabetes mellitus with diabetic chronic kidney disease: Secondary | ICD-10-CM | POA: Diagnosis not present

## 2021-01-05 DIAGNOSIS — I129 Hypertensive chronic kidney disease with stage 1 through stage 4 chronic kidney disease, or unspecified chronic kidney disease: Secondary | ICD-10-CM | POA: Diagnosis not present

## 2021-01-05 DIAGNOSIS — Z471 Aftercare following joint replacement surgery: Secondary | ICD-10-CM | POA: Diagnosis not present

## 2021-01-05 DIAGNOSIS — E1140 Type 2 diabetes mellitus with diabetic neuropathy, unspecified: Secondary | ICD-10-CM | POA: Diagnosis not present

## 2021-01-05 DIAGNOSIS — I719 Aortic aneurysm of unspecified site, without rupture: Secondary | ICD-10-CM | POA: Diagnosis not present

## 2021-01-08 DIAGNOSIS — E1122 Type 2 diabetes mellitus with diabetic chronic kidney disease: Secondary | ICD-10-CM | POA: Diagnosis not present

## 2021-01-08 DIAGNOSIS — I129 Hypertensive chronic kidney disease with stage 1 through stage 4 chronic kidney disease, or unspecified chronic kidney disease: Secondary | ICD-10-CM | POA: Diagnosis not present

## 2021-01-08 DIAGNOSIS — Z96651 Presence of right artificial knee joint: Secondary | ICD-10-CM | POA: Diagnosis not present

## 2021-01-08 DIAGNOSIS — I2510 Atherosclerotic heart disease of native coronary artery without angina pectoris: Secondary | ICD-10-CM | POA: Diagnosis not present

## 2021-01-08 DIAGNOSIS — N189 Chronic kidney disease, unspecified: Secondary | ICD-10-CM | POA: Diagnosis not present

## 2021-01-08 DIAGNOSIS — E1140 Type 2 diabetes mellitus with diabetic neuropathy, unspecified: Secondary | ICD-10-CM | POA: Diagnosis not present

## 2021-01-08 DIAGNOSIS — H9190 Unspecified hearing loss, unspecified ear: Secondary | ICD-10-CM | POA: Diagnosis not present

## 2021-01-08 DIAGNOSIS — Z471 Aftercare following joint replacement surgery: Secondary | ICD-10-CM | POA: Diagnosis not present

## 2021-01-08 DIAGNOSIS — I719 Aortic aneurysm of unspecified site, without rupture: Secondary | ICD-10-CM | POA: Diagnosis not present

## 2021-01-09 DIAGNOSIS — I2510 Atherosclerotic heart disease of native coronary artery without angina pectoris: Secondary | ICD-10-CM | POA: Diagnosis not present

## 2021-01-09 DIAGNOSIS — I129 Hypertensive chronic kidney disease with stage 1 through stage 4 chronic kidney disease, or unspecified chronic kidney disease: Secondary | ICD-10-CM | POA: Diagnosis not present

## 2021-01-09 DIAGNOSIS — E1140 Type 2 diabetes mellitus with diabetic neuropathy, unspecified: Secondary | ICD-10-CM | POA: Diagnosis not present

## 2021-01-09 DIAGNOSIS — E119 Type 2 diabetes mellitus without complications: Secondary | ICD-10-CM | POA: Diagnosis not present

## 2021-01-09 DIAGNOSIS — N189 Chronic kidney disease, unspecified: Secondary | ICD-10-CM | POA: Diagnosis not present

## 2021-01-09 DIAGNOSIS — Z471 Aftercare following joint replacement surgery: Secondary | ICD-10-CM | POA: Diagnosis not present

## 2021-01-09 DIAGNOSIS — E1122 Type 2 diabetes mellitus with diabetic chronic kidney disease: Secondary | ICD-10-CM | POA: Diagnosis not present

## 2021-01-09 DIAGNOSIS — I719 Aortic aneurysm of unspecified site, without rupture: Secondary | ICD-10-CM | POA: Diagnosis not present

## 2021-01-09 DIAGNOSIS — H9190 Unspecified hearing loss, unspecified ear: Secondary | ICD-10-CM | POA: Diagnosis not present

## 2021-01-09 DIAGNOSIS — Z96651 Presence of right artificial knee joint: Secondary | ICD-10-CM | POA: Diagnosis not present

## 2021-01-10 DIAGNOSIS — E119 Type 2 diabetes mellitus without complications: Secondary | ICD-10-CM | POA: Diagnosis not present

## 2021-01-10 DIAGNOSIS — E1140 Type 2 diabetes mellitus with diabetic neuropathy, unspecified: Secondary | ICD-10-CM | POA: Diagnosis not present

## 2021-01-10 DIAGNOSIS — E785 Hyperlipidemia, unspecified: Secondary | ICD-10-CM | POA: Diagnosis not present

## 2021-01-11 DIAGNOSIS — I719 Aortic aneurysm of unspecified site, without rupture: Secondary | ICD-10-CM | POA: Diagnosis not present

## 2021-01-11 DIAGNOSIS — N189 Chronic kidney disease, unspecified: Secondary | ICD-10-CM | POA: Diagnosis not present

## 2021-01-11 DIAGNOSIS — Z96651 Presence of right artificial knee joint: Secondary | ICD-10-CM | POA: Diagnosis not present

## 2021-01-11 DIAGNOSIS — E1140 Type 2 diabetes mellitus with diabetic neuropathy, unspecified: Secondary | ICD-10-CM | POA: Diagnosis not present

## 2021-01-11 DIAGNOSIS — E1122 Type 2 diabetes mellitus with diabetic chronic kidney disease: Secondary | ICD-10-CM | POA: Diagnosis not present

## 2021-01-11 DIAGNOSIS — H9190 Unspecified hearing loss, unspecified ear: Secondary | ICD-10-CM | POA: Diagnosis not present

## 2021-01-11 DIAGNOSIS — I129 Hypertensive chronic kidney disease with stage 1 through stage 4 chronic kidney disease, or unspecified chronic kidney disease: Secondary | ICD-10-CM | POA: Diagnosis not present

## 2021-01-11 DIAGNOSIS — Z471 Aftercare following joint replacement surgery: Secondary | ICD-10-CM | POA: Diagnosis not present

## 2021-01-11 DIAGNOSIS — I2510 Atherosclerotic heart disease of native coronary artery without angina pectoris: Secondary | ICD-10-CM | POA: Diagnosis not present

## 2021-01-16 DIAGNOSIS — H9190 Unspecified hearing loss, unspecified ear: Secondary | ICD-10-CM | POA: Diagnosis not present

## 2021-01-16 DIAGNOSIS — E1140 Type 2 diabetes mellitus with diabetic neuropathy, unspecified: Secondary | ICD-10-CM | POA: Diagnosis not present

## 2021-01-16 DIAGNOSIS — I719 Aortic aneurysm of unspecified site, without rupture: Secondary | ICD-10-CM | POA: Diagnosis not present

## 2021-01-16 DIAGNOSIS — N189 Chronic kidney disease, unspecified: Secondary | ICD-10-CM | POA: Diagnosis not present

## 2021-01-16 DIAGNOSIS — Z471 Aftercare following joint replacement surgery: Secondary | ICD-10-CM | POA: Diagnosis not present

## 2021-01-16 DIAGNOSIS — Z96651 Presence of right artificial knee joint: Secondary | ICD-10-CM | POA: Diagnosis not present

## 2021-01-16 DIAGNOSIS — I129 Hypertensive chronic kidney disease with stage 1 through stage 4 chronic kidney disease, or unspecified chronic kidney disease: Secondary | ICD-10-CM | POA: Diagnosis not present

## 2021-01-16 DIAGNOSIS — E1122 Type 2 diabetes mellitus with diabetic chronic kidney disease: Secondary | ICD-10-CM | POA: Diagnosis not present

## 2021-01-16 DIAGNOSIS — I2510 Atherosclerotic heart disease of native coronary artery without angina pectoris: Secondary | ICD-10-CM | POA: Diagnosis not present

## 2021-01-18 DIAGNOSIS — I129 Hypertensive chronic kidney disease with stage 1 through stage 4 chronic kidney disease, or unspecified chronic kidney disease: Secondary | ICD-10-CM | POA: Diagnosis not present

## 2021-01-18 DIAGNOSIS — N189 Chronic kidney disease, unspecified: Secondary | ICD-10-CM | POA: Diagnosis not present

## 2021-01-18 DIAGNOSIS — Z96651 Presence of right artificial knee joint: Secondary | ICD-10-CM | POA: Diagnosis not present

## 2021-01-18 DIAGNOSIS — I2510 Atherosclerotic heart disease of native coronary artery without angina pectoris: Secondary | ICD-10-CM | POA: Diagnosis not present

## 2021-01-18 DIAGNOSIS — I719 Aortic aneurysm of unspecified site, without rupture: Secondary | ICD-10-CM | POA: Diagnosis not present

## 2021-01-18 DIAGNOSIS — E1122 Type 2 diabetes mellitus with diabetic chronic kidney disease: Secondary | ICD-10-CM | POA: Diagnosis not present

## 2021-01-18 DIAGNOSIS — Z471 Aftercare following joint replacement surgery: Secondary | ICD-10-CM | POA: Diagnosis not present

## 2021-01-18 DIAGNOSIS — H9190 Unspecified hearing loss, unspecified ear: Secondary | ICD-10-CM | POA: Diagnosis not present

## 2021-01-18 DIAGNOSIS — E1140 Type 2 diabetes mellitus with diabetic neuropathy, unspecified: Secondary | ICD-10-CM | POA: Diagnosis not present

## 2021-01-23 DIAGNOSIS — N189 Chronic kidney disease, unspecified: Secondary | ICD-10-CM | POA: Diagnosis not present

## 2021-01-23 DIAGNOSIS — I719 Aortic aneurysm of unspecified site, without rupture: Secondary | ICD-10-CM | POA: Diagnosis not present

## 2021-01-23 DIAGNOSIS — I2510 Atherosclerotic heart disease of native coronary artery without angina pectoris: Secondary | ICD-10-CM | POA: Diagnosis not present

## 2021-01-23 DIAGNOSIS — E1122 Type 2 diabetes mellitus with diabetic chronic kidney disease: Secondary | ICD-10-CM | POA: Diagnosis not present

## 2021-01-23 DIAGNOSIS — I129 Hypertensive chronic kidney disease with stage 1 through stage 4 chronic kidney disease, or unspecified chronic kidney disease: Secondary | ICD-10-CM | POA: Diagnosis not present

## 2021-01-23 DIAGNOSIS — Z471 Aftercare following joint replacement surgery: Secondary | ICD-10-CM | POA: Diagnosis not present

## 2021-01-23 DIAGNOSIS — Z96651 Presence of right artificial knee joint: Secondary | ICD-10-CM | POA: Diagnosis not present

## 2021-01-23 DIAGNOSIS — E1140 Type 2 diabetes mellitus with diabetic neuropathy, unspecified: Secondary | ICD-10-CM | POA: Diagnosis not present

## 2021-01-23 DIAGNOSIS — H9190 Unspecified hearing loss, unspecified ear: Secondary | ICD-10-CM | POA: Diagnosis not present

## 2021-01-26 DIAGNOSIS — Z471 Aftercare following joint replacement surgery: Secondary | ICD-10-CM | POA: Diagnosis not present

## 2021-01-26 DIAGNOSIS — E1122 Type 2 diabetes mellitus with diabetic chronic kidney disease: Secondary | ICD-10-CM | POA: Diagnosis not present

## 2021-01-26 DIAGNOSIS — E1140 Type 2 diabetes mellitus with diabetic neuropathy, unspecified: Secondary | ICD-10-CM | POA: Diagnosis not present

## 2021-01-26 DIAGNOSIS — I719 Aortic aneurysm of unspecified site, without rupture: Secondary | ICD-10-CM | POA: Diagnosis not present

## 2021-01-26 DIAGNOSIS — H9190 Unspecified hearing loss, unspecified ear: Secondary | ICD-10-CM | POA: Diagnosis not present

## 2021-01-26 DIAGNOSIS — N189 Chronic kidney disease, unspecified: Secondary | ICD-10-CM | POA: Diagnosis not present

## 2021-01-26 DIAGNOSIS — I2510 Atherosclerotic heart disease of native coronary artery without angina pectoris: Secondary | ICD-10-CM | POA: Diagnosis not present

## 2021-01-26 DIAGNOSIS — I129 Hypertensive chronic kidney disease with stage 1 through stage 4 chronic kidney disease, or unspecified chronic kidney disease: Secondary | ICD-10-CM | POA: Diagnosis not present

## 2021-01-26 DIAGNOSIS — Z96651 Presence of right artificial knee joint: Secondary | ICD-10-CM | POA: Diagnosis not present

## 2021-01-29 DIAGNOSIS — H9190 Unspecified hearing loss, unspecified ear: Secondary | ICD-10-CM | POA: Diagnosis not present

## 2021-01-29 DIAGNOSIS — I2510 Atherosclerotic heart disease of native coronary artery without angina pectoris: Secondary | ICD-10-CM | POA: Diagnosis not present

## 2021-01-29 DIAGNOSIS — N189 Chronic kidney disease, unspecified: Secondary | ICD-10-CM | POA: Diagnosis not present

## 2021-01-29 DIAGNOSIS — Z471 Aftercare following joint replacement surgery: Secondary | ICD-10-CM | POA: Diagnosis not present

## 2021-01-29 DIAGNOSIS — I719 Aortic aneurysm of unspecified site, without rupture: Secondary | ICD-10-CM | POA: Diagnosis not present

## 2021-01-29 DIAGNOSIS — E1122 Type 2 diabetes mellitus with diabetic chronic kidney disease: Secondary | ICD-10-CM | POA: Diagnosis not present

## 2021-01-29 DIAGNOSIS — E1140 Type 2 diabetes mellitus with diabetic neuropathy, unspecified: Secondary | ICD-10-CM | POA: Diagnosis not present

## 2021-01-29 DIAGNOSIS — Z96651 Presence of right artificial knee joint: Secondary | ICD-10-CM | POA: Diagnosis not present

## 2021-01-29 DIAGNOSIS — I129 Hypertensive chronic kidney disease with stage 1 through stage 4 chronic kidney disease, or unspecified chronic kidney disease: Secondary | ICD-10-CM | POA: Diagnosis not present

## 2021-04-11 DIAGNOSIS — N183 Chronic kidney disease, stage 3 unspecified: Secondary | ICD-10-CM | POA: Diagnosis not present

## 2021-04-11 DIAGNOSIS — I10 Essential (primary) hypertension: Secondary | ICD-10-CM | POA: Diagnosis not present

## 2021-04-11 DIAGNOSIS — E1122 Type 2 diabetes mellitus with diabetic chronic kidney disease: Secondary | ICD-10-CM | POA: Diagnosis not present

## 2021-04-11 DIAGNOSIS — R809 Proteinuria, unspecified: Secondary | ICD-10-CM | POA: Diagnosis not present

## 2021-04-11 DIAGNOSIS — E119 Type 2 diabetes mellitus without complications: Secondary | ICD-10-CM | POA: Diagnosis not present

## 2021-04-17 DIAGNOSIS — E785 Hyperlipidemia, unspecified: Secondary | ICD-10-CM | POA: Diagnosis not present

## 2021-04-17 DIAGNOSIS — E1140 Type 2 diabetes mellitus with diabetic neuropathy, unspecified: Secondary | ICD-10-CM | POA: Diagnosis not present

## 2021-04-17 DIAGNOSIS — E119 Type 2 diabetes mellitus without complications: Secondary | ICD-10-CM | POA: Diagnosis not present

## 2021-04-18 DIAGNOSIS — E876 Hypokalemia: Secondary | ICD-10-CM | POA: Diagnosis not present

## 2021-04-18 DIAGNOSIS — E1129 Type 2 diabetes mellitus with other diabetic kidney complication: Secondary | ICD-10-CM | POA: Diagnosis not present

## 2021-04-18 DIAGNOSIS — N183 Chronic kidney disease, stage 3 unspecified: Secondary | ICD-10-CM | POA: Diagnosis not present

## 2021-04-18 DIAGNOSIS — N2581 Secondary hyperparathyroidism of renal origin: Secondary | ICD-10-CM | POA: Diagnosis not present

## 2021-04-18 DIAGNOSIS — I10 Essential (primary) hypertension: Secondary | ICD-10-CM | POA: Diagnosis not present

## 2021-04-26 DIAGNOSIS — E119 Type 2 diabetes mellitus without complications: Secondary | ICD-10-CM | POA: Diagnosis not present

## 2021-07-18 DIAGNOSIS — E119 Type 2 diabetes mellitus without complications: Secondary | ICD-10-CM | POA: Diagnosis not present

## 2021-07-25 DIAGNOSIS — E1140 Type 2 diabetes mellitus with diabetic neuropathy, unspecified: Secondary | ICD-10-CM | POA: Diagnosis not present

## 2021-07-25 DIAGNOSIS — E119 Type 2 diabetes mellitus without complications: Secondary | ICD-10-CM | POA: Diagnosis not present

## 2021-07-25 DIAGNOSIS — E785 Hyperlipidemia, unspecified: Secondary | ICD-10-CM | POA: Diagnosis not present

## 2021-08-23 IMAGING — DX DG KNEE 1-2V PORT*R*
2 series · 2 of 2 positions shown · non-contrast
Comparison: Portable exam 7175 hours compared to 12/31/2007

CLINICAL DATA: Post RIGHT knee arthroplasty

EXAM:
PORTABLE RIGHT KNEE - 1-2 VIEW

[knee ap]
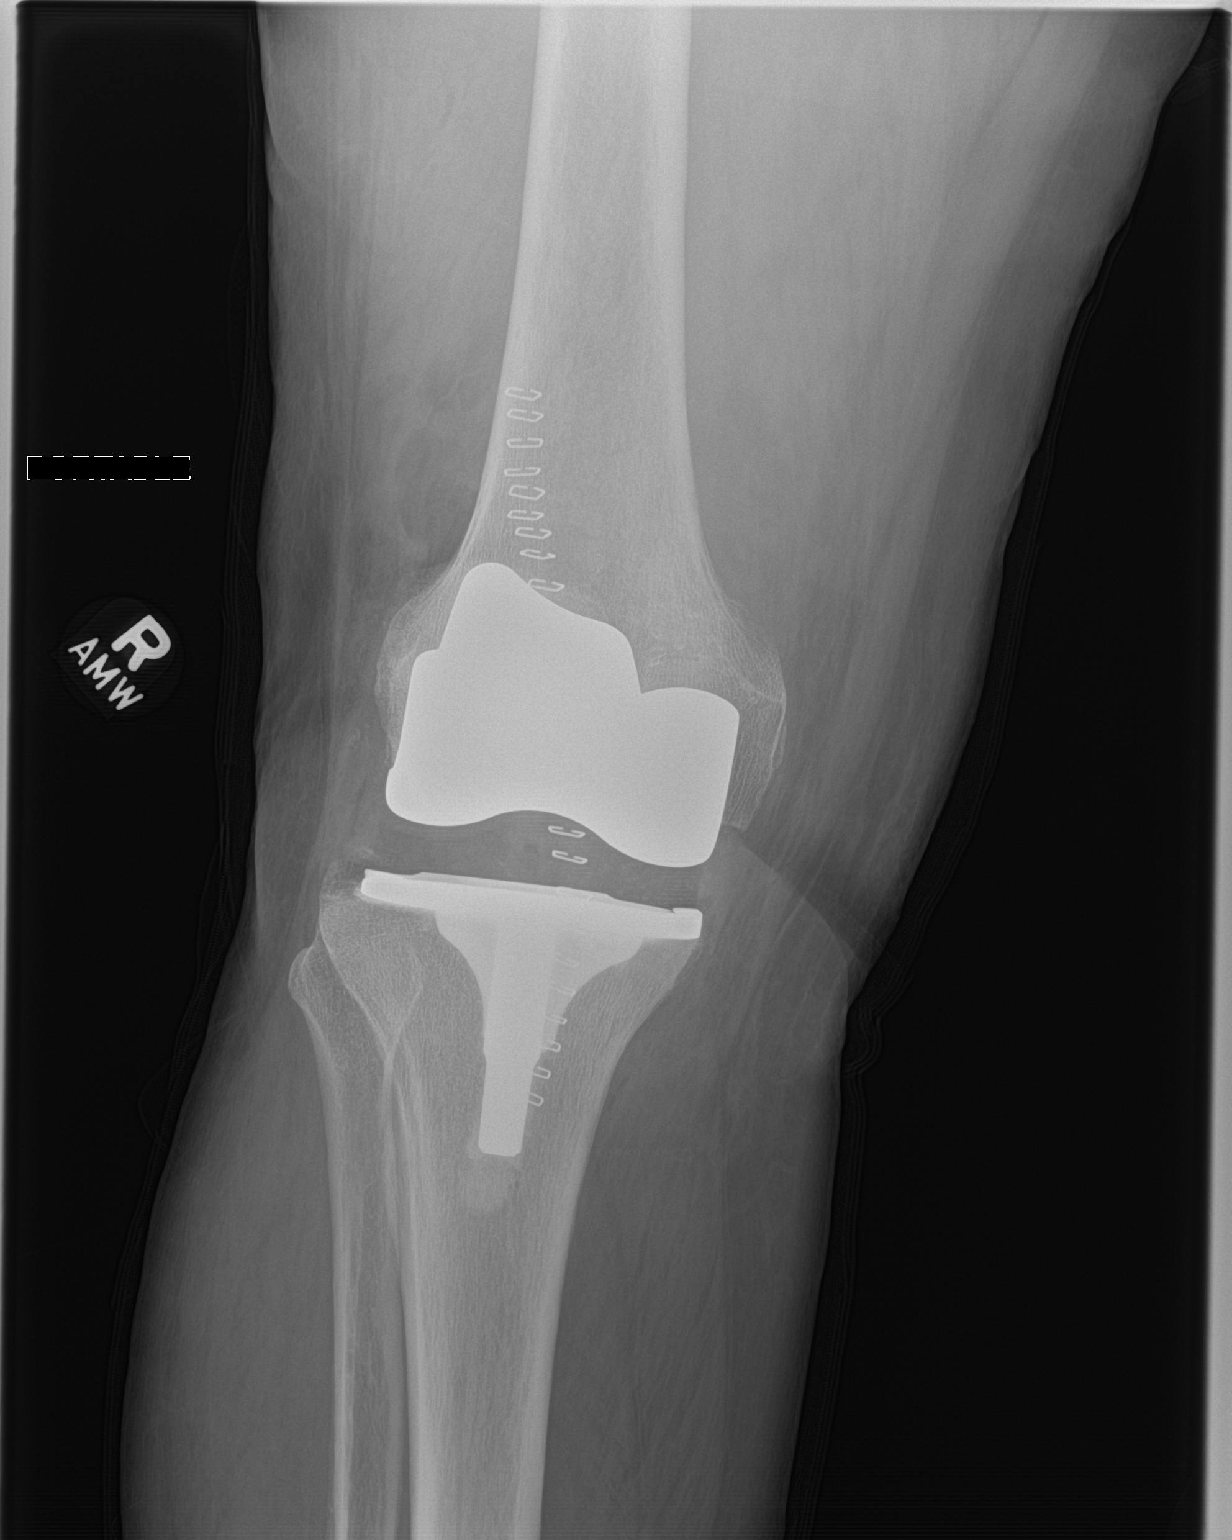

[knee lat]
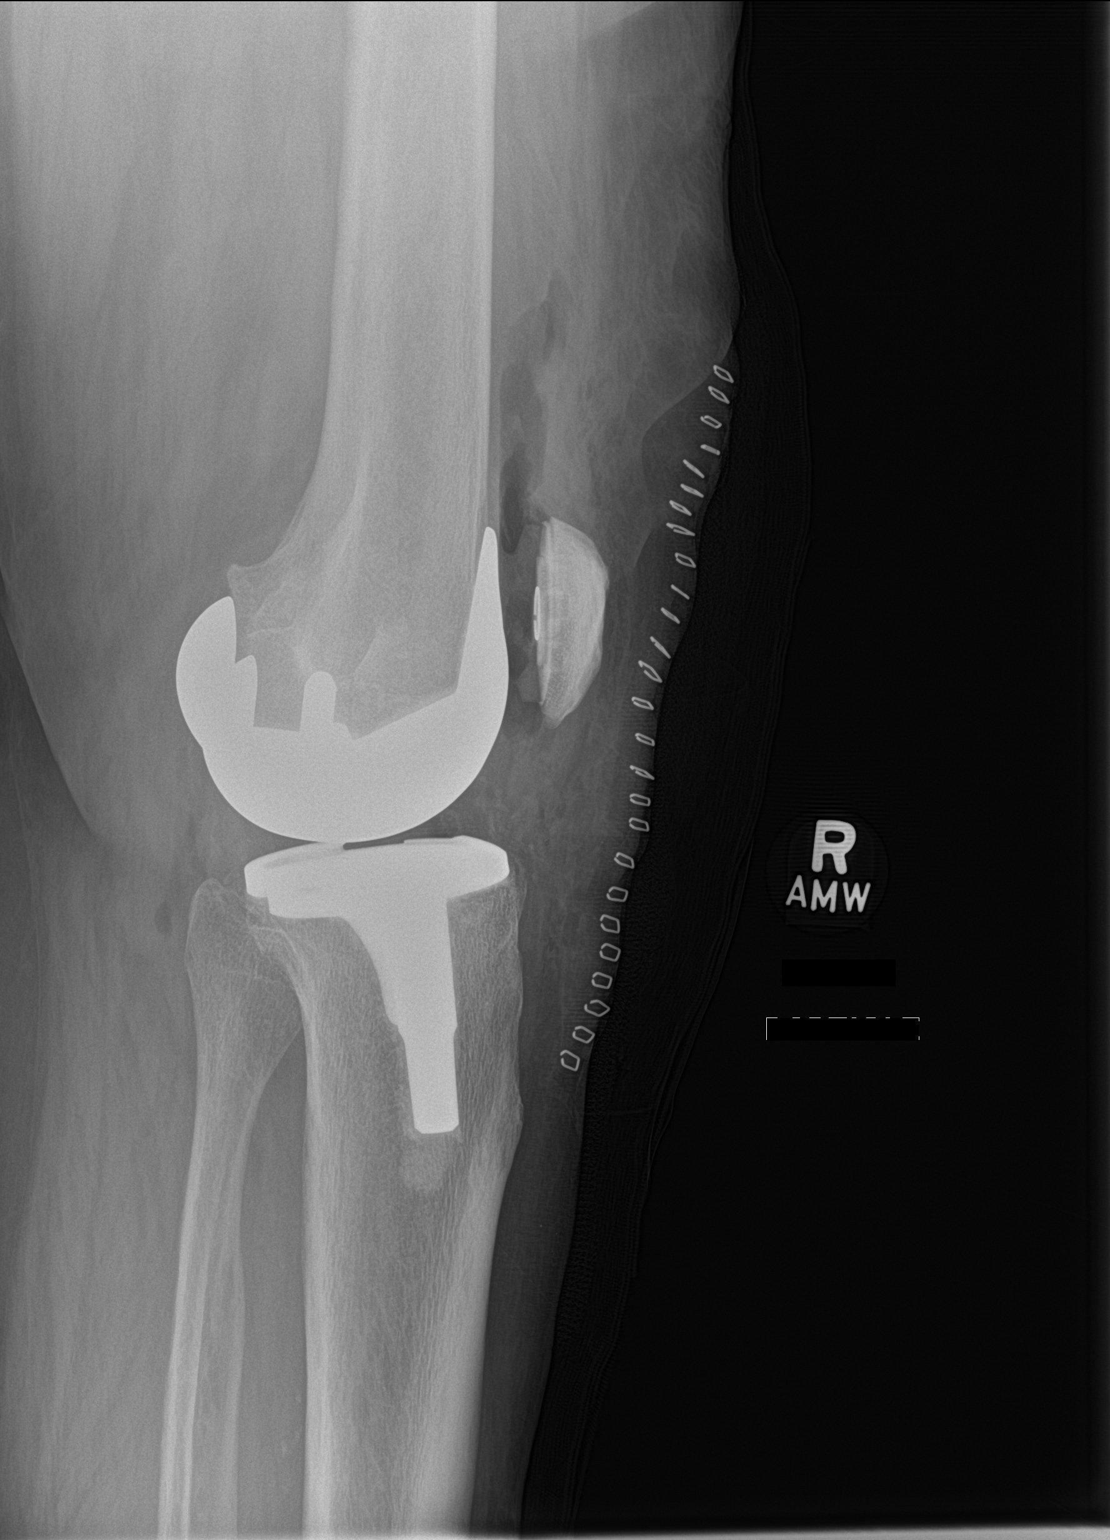

[2 of 2 positions shown; findings below may reference images not displayed]

FINDINGS: Interval RIGHT total knee arthroplasty.

Mild osseous demineralization.

Joint alignment normal.

No acute fracture, dislocation, bone destruction or periprosthetic
lucency.

Anterior skin clips and postsurgical changes of the soft tissues.
IMPRESSION: RIGHT knee prosthesis without acute complication.

## 2021-10-18 DIAGNOSIS — E119 Type 2 diabetes mellitus without complications: Secondary | ICD-10-CM | POA: Diagnosis not present

## 2021-10-26 DIAGNOSIS — L84 Corns and callosities: Secondary | ICD-10-CM | POA: Diagnosis not present

## 2021-10-26 DIAGNOSIS — E785 Hyperlipidemia, unspecified: Secondary | ICD-10-CM | POA: Diagnosis not present

## 2021-10-26 DIAGNOSIS — I10 Essential (primary) hypertension: Secondary | ICD-10-CM | POA: Diagnosis not present

## 2021-10-26 DIAGNOSIS — E119 Type 2 diabetes mellitus without complications: Secondary | ICD-10-CM | POA: Diagnosis not present

## 2021-10-26 DIAGNOSIS — E1140 Type 2 diabetes mellitus with diabetic neuropathy, unspecified: Secondary | ICD-10-CM | POA: Diagnosis not present

## 2021-10-26 DIAGNOSIS — Z0001 Encounter for general adult medical examination with abnormal findings: Secondary | ICD-10-CM | POA: Diagnosis not present

## 2021-11-23 DIAGNOSIS — Z96651 Presence of right artificial knee joint: Secondary | ICD-10-CM | POA: Diagnosis not present

## 2022-01-25 DIAGNOSIS — E119 Type 2 diabetes mellitus without complications: Secondary | ICD-10-CM | POA: Diagnosis not present

## 2022-01-25 DIAGNOSIS — I10 Essential (primary) hypertension: Secondary | ICD-10-CM | POA: Diagnosis not present

## 2022-01-28 DIAGNOSIS — E119 Type 2 diabetes mellitus without complications: Secondary | ICD-10-CM | POA: Diagnosis not present

## 2022-01-28 DIAGNOSIS — E785 Hyperlipidemia, unspecified: Secondary | ICD-10-CM | POA: Diagnosis not present

## 2022-01-28 DIAGNOSIS — E1140 Type 2 diabetes mellitus with diabetic neuropathy, unspecified: Secondary | ICD-10-CM | POA: Diagnosis not present

## 2022-01-28 DIAGNOSIS — I10 Essential (primary) hypertension: Secondary | ICD-10-CM | POA: Diagnosis not present

## 2022-04-17 DIAGNOSIS — E876 Hypokalemia: Secondary | ICD-10-CM | POA: Diagnosis not present

## 2022-04-17 DIAGNOSIS — N183 Chronic kidney disease, stage 3 unspecified: Secondary | ICD-10-CM | POA: Diagnosis not present

## 2022-04-17 DIAGNOSIS — E1129 Type 2 diabetes mellitus with other diabetic kidney complication: Secondary | ICD-10-CM | POA: Diagnosis not present

## 2022-04-17 DIAGNOSIS — I10 Essential (primary) hypertension: Secondary | ICD-10-CM | POA: Diagnosis not present

## 2022-04-17 DIAGNOSIS — N2581 Secondary hyperparathyroidism of renal origin: Secondary | ICD-10-CM | POA: Diagnosis not present

## 2022-04-22 DIAGNOSIS — E1129 Type 2 diabetes mellitus with other diabetic kidney complication: Secondary | ICD-10-CM | POA: Diagnosis not present

## 2022-04-22 DIAGNOSIS — N182 Chronic kidney disease, stage 2 (mild): Secondary | ICD-10-CM | POA: Diagnosis not present

## 2022-04-22 DIAGNOSIS — R809 Proteinuria, unspecified: Secondary | ICD-10-CM | POA: Diagnosis not present

## 2022-04-22 DIAGNOSIS — I10 Essential (primary) hypertension: Secondary | ICD-10-CM | POA: Diagnosis not present

## 2022-04-24 DIAGNOSIS — E119 Type 2 diabetes mellitus without complications: Secondary | ICD-10-CM | POA: Diagnosis not present

## 2022-04-30 DIAGNOSIS — H43813 Vitreous degeneration, bilateral: Secondary | ICD-10-CM | POA: Diagnosis not present

## 2022-05-03 DIAGNOSIS — E1140 Type 2 diabetes mellitus with diabetic neuropathy, unspecified: Secondary | ICD-10-CM | POA: Diagnosis not present

## 2022-05-03 DIAGNOSIS — E119 Type 2 diabetes mellitus without complications: Secondary | ICD-10-CM | POA: Diagnosis not present

## 2022-05-03 DIAGNOSIS — E785 Hyperlipidemia, unspecified: Secondary | ICD-10-CM | POA: Diagnosis not present

## 2022-06-26 ENCOUNTER — Ambulatory Visit: Payer: Medicare Other | Admitting: Dermatology

## 2022-06-26 DIAGNOSIS — L821 Other seborrheic keratosis: Secondary | ICD-10-CM

## 2022-06-26 DIAGNOSIS — Z1283 Encounter for screening for malignant neoplasm of skin: Secondary | ICD-10-CM | POA: Diagnosis not present

## 2022-06-26 DIAGNOSIS — L814 Other melanin hyperpigmentation: Secondary | ICD-10-CM

## 2022-06-26 DIAGNOSIS — D039 Melanoma in situ, unspecified: Secondary | ICD-10-CM

## 2022-06-26 DIAGNOSIS — D1801 Hemangioma of skin and subcutaneous tissue: Secondary | ICD-10-CM | POA: Diagnosis not present

## 2022-06-26 DIAGNOSIS — L578 Other skin changes due to chronic exposure to nonionizing radiation: Secondary | ICD-10-CM | POA: Diagnosis not present

## 2022-06-26 DIAGNOSIS — D0371 Melanoma in situ of right lower limb, including hip: Secondary | ICD-10-CM | POA: Diagnosis not present

## 2022-06-26 DIAGNOSIS — D2239 Melanocytic nevi of other parts of face: Secondary | ICD-10-CM | POA: Diagnosis not present

## 2022-06-26 DIAGNOSIS — C439 Malignant melanoma of skin, unspecified: Secondary | ICD-10-CM

## 2022-06-26 DIAGNOSIS — D235 Other benign neoplasm of skin of trunk: Secondary | ICD-10-CM

## 2022-06-26 DIAGNOSIS — D692 Other nonthrombocytopenic purpura: Secondary | ICD-10-CM | POA: Diagnosis not present

## 2022-06-26 DIAGNOSIS — D171 Benign lipomatous neoplasm of skin and subcutaneous tissue of trunk: Secondary | ICD-10-CM

## 2022-06-26 DIAGNOSIS — L570 Actinic keratosis: Secondary | ICD-10-CM

## 2022-06-26 DIAGNOSIS — D489 Neoplasm of uncertain behavior, unspecified: Secondary | ICD-10-CM

## 2022-06-26 DIAGNOSIS — C4361 Malignant melanoma of right upper limb, including shoulder: Secondary | ICD-10-CM

## 2022-06-26 HISTORY — DX: Melanoma in situ, unspecified: D03.9

## 2022-06-26 HISTORY — DX: Malignant melanoma of skin, unspecified: C43.9

## 2022-06-26 MED ORDER — MUPIROCIN 2 % EX OINT: 1.0000 | TOPICAL_OINTMENT | Freq: Every day | CUTANEOUS | 0 refills | Status: AC

## 2022-06-26 NOTE — Progress Notes (Signed)
New Patient Visit  Subjective  Aaron Valdez is a 76 y.o. male who presents for the following: New Patient (Initial Visit) (Tbse , spot right forearm , posterior left shoulder, back of right pretibia. Patient denies any family or personal history of skin cancer. ).   The patient presents for Total-Body Skin Exam (TBSE) for skin cancer screening and mole check.  The patient has spots, moles and lesions to be evaluated, some may be new or changing and the patient has concerns that these could be cancer.   The following portions of the chart were reviewed this encounter and updated as appropriate:   Tobacco  Allergies  Meds  Problems  Med Hx  Surg Hx  Fam Hx      Review of Systems:  No other skin or systemic complaints except as noted in HPI or Assessment and Plan.  Objective  Well appearing patient in no apparent distress; mood and affect are within normal limits.  A full examination was performed including scalp, head, eyes, ears, nose, lips, neck, chest, axillae, abdomen, back, buttocks, bilateral upper extremities, bilateral lower extremities, hands, feet, fingers, toes, fingernails, and toenails. All findings within normal limits unless otherwise noted below.  right forearm 1.8 cm pink and brown plaque      right calf 1.6 cm irregular dark brown plaque        right cheek 0.6 cm skin colored papule      Left Upper Back 8 cm rubbery nodule   vertex Scalp x 1, left cheek x 1 (2) Erythematous thin papules/macules with gritty scale.     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  Skin excision  Lesion length (cm):  1.8 Lesion width (cm):  1.8 Margin per side (cm):  0.2 Total excision diameter (cm):  2.2 Informed consent: discussed and consent obtained   Timeout: patient name, date of birth, surgical site, and procedure verified   Procedure prep:  Patient was prepped and draped in usual sterile fashion Prep type:   Chlorhexidine Anesthesia: the lesion was anesthetized in a standard fashion   Anesthetic:  1% lidocaine w/ epinephrine 1-100,000 buffered w/ 8.4% NaHCO3 (7.5 cc) Instrument used: #15 blade   Hemostasis achieved with: pressure and electrodesiccation    Skin repair Complexity:  Intermediate Final length (cm):  4.9 Informed consent: discussed and consent obtained   Timeout: patient name, date of birth, surgical site, and procedure verified   Procedure prep:  Patient was prepped and draped in usual sterile fashion Prep type:  Chlorhexidine Anesthesia: the lesion was anesthetized in a standard fashion   Anesthetic:  1% lidocaine w/ epinephrine 1-100,000 local infiltration Reason for type of repair: reduce tension to allow closure, reduce the risk of dehiscence, infection, and necrosis, preserve normal anatomy and preserve normal anatomical and functional relationships   Undermining: edges undermined   Subcutaneous layers (deep stitches):  Suture size:  3-0 Suture type: Vicryl (polyglactin 910)   Fine/surface layer approximation (top stitches):  Suture size:  4-0 Suture type: Prolene (polypropylene)   Suture removal (days):  7 Hemostasis achieved with: suture, pressure and electrodesiccation Outcome: patient tolerated procedure well with no complications   Post-procedure details: wound care instructions given   Additional details:  Mupirocin and a pressure dressing applied  mupirocin ointment (BACTROBAN) 2 % Apply 1 Application topically daily.  Specimen 2 - Surgical pathology Differential Diagnosis: r/o Malignant Melanoma  Check Margins: No 1.8 cm pink and brown plaque  right calf  Epidermal /  dermal shaving  Lesion diameter (cm):  1.6 Informed consent: discussed and consent obtained   Timeout: patient name, date of birth, surgical site, and procedure verified   Anesthesia: the lesion was anesthetized in a standard fashion   Anesthetic:  1% lidocaine w/ epinephrine 1-100,000  local infiltration Instrument used: #15 blade   Hemostasis achieved with: aluminum chloride   Outcome: patient tolerated procedure well   Post-procedure details: wound care instructions given   Additional details:  Mupirocin and a bandage applied  Specimen 3 - Surgical pathology Differential Diagnosis: r/o Malignant Melanoma  Check Margins: No 1.6 cm irregular dark brown plaque  right cheek  Skin / nail biopsy Type of biopsy: tangential   Informed consent: discussed and consent obtained   Timeout: patient name, date of birth, surgical site, and procedure verified   Procedure prep:  Patient was prepped and draped in usual sterile fashion Prep type:  Isopropyl alcohol Anesthesia: the lesion was anesthetized in a standard fashion   Anesthetic:  1% lidocaine w/ epinephrine 1-100,000 buffered w/ 8.4% NaHCO3 Instrument used: flexible razor blade   Hemostasis achieved with: aluminum chloride   Outcome: patient tolerated procedure well   Post-procedure details: wound care instructions given   Additional details:  Petrolatum and a pressure bandage applied  Specimen 1 - Surgical pathology Differential Diagnosis: r/o BCC  Check Margins: No 0.6 cm skin colored papule  Recommend excisional biopsy   R/o malignant melanoma     Lipoma of back Left Upper Back  Cyst with symptoms and/or recent change.  Discussed surgical excision to remove, including resulting scar and possible recurrence.  Patient will schedule for surgery. Pre-op information given.   Actinic keratosis (2) vertex Scalp x 1, left cheek x 1  Actinic keratoses are precancerous spots that appear secondary to cumulative UV radiation exposure/sun exposure over time. They are chronic with expected duration over 1 year. A portion of actinic keratoses will progress to squamous cell carcinoma of the skin. It is not possible to reliably predict which spots will progress to skin cancer and so treatment is recommended to prevent  development of skin cancer.  Recommend daily broad spectrum sunscreen SPF 30+ to sun-exposed areas, reapply every 2 hours as needed.  Recommend staying in the shade or wearing long sleeves, sun glasses (UVA+UVB protection) and wide brim hats (4-inch brim around the entire circumference of the hat). Call for new or changing lesions.  Destruction of lesion - vertex Scalp x 1, left cheek x 1 Complexity: simple   Destruction method: cryotherapy   Informed consent: discussed and consent obtained   Timeout:  patient name, date of birth, surgical site, and procedure verified Lesion destroyed using liquid nitrogen: Yes   Region frozen until ice ball extended beyond lesion: Yes   Outcome: patient tolerated procedure well with no complications   Post-procedure details: wound care instructions given   Additional details:  Prior to procedure, discussed risks of blister formation, small wound, skin dyspigmentation, or rare scar following cryotherapy. Recommend Vaseline ointment to treated areas while healing.   Lentigines - Scattered tan macules - Due to sun exposure - Benign-appearing, observe - Recommend daily broad spectrum sunscreen SPF 30+ to sun-exposed areas, reapply every 2 hours as needed. - Call for any changes  Seborrheic Keratoses - Stuck-on, waxy, tan-brown papules and/or plaques  - Benign-appearing - Discussed benign etiology and prognosis. - Observe - Call for any changes  Melanocytic Nevi - Tan-brown and/or pink-flesh-colored symmetric macules and papules - Benign appearing on exam  today - Observation - Call clinic for new or changing moles - Recommend daily use of broad spectrum spf 30+ sunscreen to sun-exposed areas.   Hemangiomas - Red papules - Discussed benign nature - Observe - Call for any changes  Purpura - Chronic; persistent and recurrent.  Treatable, but not curable. 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  - Staying in the shade or wearing long sleeves, sun glasses (UVA+UVB protection) and wide brim hats (4-inch brim around the entire circumference of the hat) are also recommended for sun protection.  - Call for new or changing lesions.  Skin cancer screening performed today. No follow-ups on file.  I, Ruthell Rummage, CMA, am acting as scribe for Forest Gleason, MD.  Documentation: I have reviewed the above documentation for accuracy and completeness, and I agree with the above.  Forest Gleason, MD

## 2022-06-26 NOTE — Patient Instructions (Addendum)
Recommend applying sunscreen daily to face and ears.     Actinic keratoses are precancerous spots that appear secondary to cumulative UV radiation exposure/sun exposure over time. They are chronic with expected duration over 1 year. A portion of actinic keratoses will progress to squamous cell carcinoma of the skin. It is not possible to reliably predict which spots will progress to skin cancer and so treatment is recommended to prevent development of skin cancer.  Recommend daily broad spectrum sunscreen SPF 30+ to sun-exposed areas, reapply every 2 hours as needed.  Recommend staying in the shade or wearing long sleeves, sun glasses (UVA+UVB protection) and wide brim hats (4-inch brim around the entire circumference of the hat). Call for new or changing lesions.    Cryotherapy Aftercare  Wash gently with soap and water everyday.   Apply Vaseline and Band-Aid daily until healed.    Wound Care Instructions for After Surgery  On the day following your surgery, you should begin doing daily dressing changes until your sutures are removed: Remove the bandage. Cleanse the wound gently with soap and water.  Make sure you then dry the skin surrounding the wound completely or the tape will not stick to the skin. Do not use cotton balls on the wound. After the wound is clean and dry, apply the ointment (either prescription antibiotic prescribed by your doctor or plain Vaseline if nothing was prescribed) gently with a Q-tip. If you are using a bandaid to cover: Apply a bandaid large enough to cover the entire wound. If you do not have a bandaid large enough to cover the wound OR if you are sensitive to bandaid adhesive: Cut a non-stick pad (such as Telfa) to fit the size of the wound.  Cover the wound with the non-stick pad. If the wound is draining, you may want to add a small amount of gauze on top of the non-stick pad for a little added compression to the area. Use tape to seal the area  completely.  For the next 1-2 weeks: Be sure to keep the wound moist with ointment 24/7 to ensure best healing. If you are unable to cover the wound with a bandage to hold the ointment in place, you may need to reapply the ointment several times a day. Do not bend over or lift heavy items to reduce the chance of elevated blood pressure to the wound. Do not participate in particularly strenuous activities.  Below is a list of dressing supplies you might need.  Cotton-tipped applicators - Q-tips Gauze pads (2x2 and/or 4x4) - All-Purpose Sponges New and clean tube of petroleum jelly (Vaseline) OR prescription antibiotic ointment if prescribed Either a bandaid large enough to cover the entire wound OR non-stick dressing material (Telfa) and Tape (Paper or Hypafix)  FOR ADULT SURGERY PATIENTS: If you need something for pain relief, you may take 1 extra strength Tylenol (acetaminophen) and 2 ibuprofen (200 mg) together every 4 hours as needed. (Do not take these medications if you are allergic to them or if you know you cannot take them for any other reason). Typically you may only need pain medication for 1-3 days.   Comments on the Post-Operative Period Slight swelling and redness often appear around the wound. This is normal and will disappear within several days following the surgery. The healing wound will drain a brownish-red-yellow discharge during healing. This is a normal phase of wound healing. As the wound begins to heal, the drainage may increase in amount. Again, this drainage is  normal. Notify us if the drainage becomes persistently bloody, excessively swollen, or intensely painful or develops a foul odor or red streaks.  The healing wound will also typically be itchy. This is normal. If you have severe or persistent pain, Notify us if the discomfort is severe or persistent. Avoid alcoholic beverages when taking pain medicine.  In Case of Wound Hemorrhage A wound hemorrhage is when the  bandage suddenly becomes soaked with bright red blood and flows profusely. If this happens, sit down or lie down with your head elevated. If the wound has a dressing on it, do not remove the dressing. Apply pressure to the existing gauze. If the wound is not covered, use a gauze pad to apply pressure and continue applying the pressure for 20 minutes without peeking. DO NOT COVER THE WOUND WITH A LARGE TOWEL OR [place] CLOTH. Release your hand from the wound site but do not remove the dressing. If the bleeding has stopped, gently clean around the wound. Leave the dressing in place for 24 hours if possible. This wait time allows the blood vessels to close off so that you do not spark a new round of bleeding by disrupting the newly clotted blood vessels with an immediate dressing change. If the bleeding does not subside, continue to hold pressure for 40 minutes. If bleeding continues, page your physician, contact an After Hours clinic or go to the Emergency Room.   Melanoma ABCDEs  Melanoma is the most dangerous type of skin cancer, and is the leading cause of death from skin disease.  You are more likely to develop melanoma if you: Have light-colored skin, light-colored eyes, or red or blond hair Spend a lot of time in the sun Tan regularly, either outdoors or in a tanning bed Have had blistering sunburns, especially during childhood Have a close family member who has had a melanoma Have atypical moles or large birthmarks  Early detection of melanoma is key since treatment is typically straightforward and cure rates are extremely high if we catch it early.   The first sign of melanoma is often a change in a mole or a new dark spot.  The ABCDE system is a way of remembering the signs of melanoma.  A for asymmetry:  The two halves do not match. B for border:  The edges of the growth are irregular. C for color:  A mixture of colors are present instead of an even brown color. D for diameter:  Melanomas  are usually (but not always) greater than 3m - the size of a pencil eraser. E for evolution:  The spot keeps changing in size, shape, and color.  Please check your skin once per month between visits. You can use a small mirror in front and a large mirror behind you to keep an eye on the back side or your body.   If you see any new or changing lesions before your next follow-up, please call to schedule a visit.  Please continue daily skin protection including broad spectrum sunscreen SPF 30+ to sun-exposed areas, reapplying every 2 hours as needed when you're outdoors.      Due to recent changes in healthcare laws, you may see results of your pathology and/or laboratory studies on MyChart before the doctors have had a chance to review them. We understand that in some cases there may be results that are confusing or concerning to you. Please understand that not all results are received at the same time and often the doctors  may need to interpret multiple results in order to provide you with the best plan of care or course of treatment. Therefore, we ask that you please give [place] 2 business days to thoroughly review all your results before contacting the office for clarification. Should we see a critical lab result, you will be contacted sooner.   If You Need Anything After Your Visit  If you have any questions or concerns for your doctor, please call our main line at [phone] and press option 4 to reach your doctor's medical assistant. If no one answers, please leave a voicemail as directed and we will return your call as soon as possible. Messages left after 4 pm will be answered the following business day.   You may also send [place] a message via [place]. We typically respond to MyChart messages within 1-2 business days.  For prescription refills, please ask your pharmacy to contact our office. Our fax number is [phone].  If you have an urgent issue when the clinic is closed that cannot  wait until the next business day, you can page your doctor at the number below.    Please note that while we do our best to be available for urgent issues outside of office hours, we are not available 24/7.   If you have an urgent issue and are unable to reach [place], you may choose to seek medical care at your doctor's office, retail clinic, urgent care center, or emergency room.  If you have a medical emergency, please immediately call 911 or go to the emergency department.  Pager Numbers  - Dr. Nehemiah Massed: [phone]  - Dr. Laurence Ferrari: [phone]  - Dr. Nicole Kindred: [phone]  In the event of inclement weather, please call our main line at [phone] for an update on the status of any delays or closures.  Dermatology Medication Tips: Please keep the boxes that topical medications come in in order to help keep track of the instructions about where and how to use these. Pharmacies typically print the medication instructions only on the boxes and not directly on the medication tubes.   If your medication is too expensive, please contact our office at [phone] option 4 or send [place] a message through [place].   We are unable to tell what your co-pay for medications will be in advance as this is different depending on your insurance coverage. However, we may be able to find a substitute medication at lower cost or fill out paperwork to get insurance to cover a needed medication.   If a prior authorization is required to get your medication covered by your insurance company, please allow [place] 1-2 business days to complete this process.  Drug prices often vary depending on where the prescription is filled and some pharmacies may offer cheaper prices.  The website www.goodrx.com contains coupons for medications through different pharmacies. The prices here do not account for what the cost may be with help from insurance (it may be cheaper with your insurance), but the website can give you the price  if you did not use any insurance.  - You can print the associated coupon and take it with your prescription to the pharmacy.  - You may also stop by our office during regular business hours and pick up a GoodRx coupon card.  - If you need your prescription sent electronically to a different pharmacy, notify our office through [facility] or by phone at [phone] option 4.     Si Usted Necesita Algo Despus  de Su Visita  Tambin puede enviarnos un mensaje a travs de Pharmacist, community. Por lo general respondemos a los mensajes de MyChart en el transcurso de 1 a 2 das hbiles.  Para renovar recetas, por favor pida a su farmacia que se ponga en contacto con nuestra oficina. Harland Dingwall de fax es [place] [phone].  Si tiene un asunto urgente cuando la clnica est cerrada y que no puede esperar hasta el siguiente da hbil, puede llamar/localizar a su doctor(a) al nmero que aparece a continuacin.   Por favor, tenga en cuenta que aunque hacemos todo lo posible para estar disponibles para asuntos urgentes fuera del horario de [place], no estamos disponibles las 24 horas del da, los 7 das de la [place].   Si tiene un problema urgente y no puede comunicarse con nosotros, puede optar por buscar atencin mdica  en el consultorio de su doctor(a), en una clnica privada, en un centro de atencin urgente o en una sala de emergencias.  Si tiene Engineering geologist, por favor llame inmediatamente al 911 o vaya a la sala de emergencias.  Nmeros de bper  - Dr. Nehemiah Massed: [phone]  - Dra. Moye: [phone]  - Dra. Nicole Kindred: [phone]  En caso de inclemencias del [place], por favor llame a Johnsie Kindred principal al [phone] para una actualizacin sobre el [place] de cualquier retraso o cierre.  Consejos para la medicacin en dermatologa: Por favor, guarde las cajas en las que vienen los medicamentos de uso tpico para ayudarle a seguir las instrucciones sobre dnde y cmo usarlos.  Las farmacias generalmente imprimen las instrucciones del medicamento slo en las cajas y no directamente en los tubos del [place].   Si su medicamento es muy caro, por favor, pngase en contacto con Zigmund Daniel llamando al [phone] y presione la opcin 4 o envenos un mensaje a travs de Pharmacist, community.   No podemos decirle cul ser su copago por los medicamentos por adelantado ya que esto es diferente dependiendo de la cobertura de su seguro. Sin embargo, es posible que podamos encontrar un medicamento sustituto a Electrical engineer un formulario para que el seguro cubra el medicamento que se considera necesario.   Si se requiere una autorizacin previa para que su compaa de seguros [place] su medicamento, por favor permtanos de 1 a 2 das hbiles para completar este proceso.  Los precios de los medicamentos varan con frecuencia dependiendo del Environmental consultant de dnde se surte la receta y alguna farmacias pueden ofrecer precios ms baratos.  El sitio web www.goodrx.com tiene cupones para medicamentos de Airline pilot. Los precios aqu no tienen en cuenta lo que podra costar con la ayuda del seguro (puede ser ms barato con su seguro), pero el sitio web puede darle el precio si no utiliz Research scientist (physical sciences).  - Puede imprimir el cupn correspondiente y llevarlo con su receta a la farmacia.  - Tambin puede pasar por nuestra oficina durante el horario de atencin regular y Charity fundraiser una tarjeta de cupones de GoodRx.  - Si necesita que su receta se enve electrnicamente a una farmacia diferente, informe a nuestra oficina a travs de MyChart de [place] o por telfono llamando al [phone] y presione la opcin 4.

## 2022-07-03 ENCOUNTER — Encounter: Payer: Self-pay | Admitting: Dermatology

## 2022-07-03 ENCOUNTER — Ambulatory Visit: Payer: Medicare Other | Admitting: Dermatology

## 2022-07-03 ENCOUNTER — Telehealth: Payer: Self-pay | Admitting: Dermatology

## 2022-07-03 ENCOUNTER — Telehealth: Payer: Self-pay

## 2022-07-03 DIAGNOSIS — D0371 Melanoma in situ of right lower limb, including hip: Secondary | ICD-10-CM

## 2022-07-03 DIAGNOSIS — D229 Melanocytic nevi, unspecified: Secondary | ICD-10-CM

## 2022-07-03 DIAGNOSIS — Z4802 Encounter for removal of sutures: Secondary | ICD-10-CM

## 2022-07-03 DIAGNOSIS — D2239 Melanocytic nevi of other parts of face: Secondary | ICD-10-CM

## 2022-07-03 DIAGNOSIS — C439 Malignant melanoma of skin, unspecified: Secondary | ICD-10-CM

## 2022-07-03 DIAGNOSIS — C4361 Malignant melanoma of right upper limb, including shoulder: Secondary | ICD-10-CM

## 2022-07-03 NOTE — Telephone Encounter (Signed)
Faxed pathology and paperwork to [facility].

## 2022-07-03 NOTE — Patient Instructions (Signed)
 Pre-Operative Instructions  You are scheduled for a surgical procedure at [facility]. We recommend you read the following instructions. If you have any questions or concerns, please call the office at [phone].  Shower and wash the entire body with soap and water the day of your surgery paying special attention to cleansing at and around the planned surgery site.  Avoid aspirin or aspirin containing products at least fourteen (14) days prior to your surgical procedure and for at least one week (7 Days) after your surgical procedure. If you take aspirin on a regular basis for heart disease or history of stroke or for any other reason, we may recommend you continue taking aspirin but please notify us if you take this on a regular basis. Aspirin can cause more bleeding to occur during surgery as well as prolonged bleeding and bruising after surgery.   Avoid other nonsteroidal pain medications at least one week prior to surgery and at least one week prior to your surgery. These include medications such as Ibuprofen (Motrin, Advil and Nuprin), Naprosyn, Voltaren, Relafen, etc. If medications are used for therapeutic reasons, please inform us as they can cause increased bleeding or prolonged bleeding during and bruising after surgical procedures.   Please advise us if you are taking any "blood thinner" medications such as Coumadin or Dipyridamole or Plavix or similar medications. These cause increased bleeding and prolonged bleeding during procedures and bruising after surgical procedures. We may have to consider discontinuing these medications briefly prior to and shortly after your surgery if safe to do so.   Please inform us of all medications you are currently taking. All medications that are taken regularly should be taken the day of surgery as you always do. Nevertheless, we need to be informed of what medications you are taking prior to surgery to know whether they will affect the  procedure or cause any complications.   Please inform us of any medication allergies. Also inform us of whether you have allergies to Latex or rubber products or whether you have had any adverse reaction to Lidocaine or Epinephrine.  Please inform us of any prosthetic or artificial body parts such as artificial heart valve, joint replacements, etc., or similar condition that might require preoperative antibiotics.   We recommend avoidance of alcohol at least two weeks prior to surgery and continued avoidance for at least two weeks after surgery.   We recommend discontinuation of tobacco smoking at least two weeks prior to surgery and continued abstinence for at least two weeks after surgery.  Do not plan strenuous exercise, strenuous work or strenuous lifting for approximately four weeks after your surgery.   We request if you are unable to make your scheduled surgical appointment, please call us at least a week in advance or as soon as you are aware of a problem so that we can cancel or reschedule the appointment.   You MAY TAKE TYLENOL (acetaminophen) for pain as it is not a blood thinner.   PLEASE PLAN TO BE IN TOWN FOR TWO WEEKS FOLLOWING SURGERY, THIS IS IMPORTANT SO YOU CAN BE CHECKED FOR DRESSING CHANGES, SUTURE REMOVAL AND TO MONITOR FOR POSSIBLE COMPLICATIONS.    Due to recent changes in healthcare laws, you may see results of your pathology and/or laboratory studies on MyChart before the doctors have had a chance to review them. We understand that in some cases there may be results that are confusing or concerning to you. Please understand that not all results are   received at the same time and often the doctors may need to interpret multiple results in order to provide you with the best plan of care or course of treatment. Therefore, we ask that you please give us 2 business days to thoroughly review all your results before contacting the office for clarification. Should we see a  critical lab result, you will be contacted sooner.   If You Need Anything After Your Visit  If you have any questions or concerns for your doctor, please call our main line at [phone] and press option 4 to reach your doctor's medical assistant. If no one answers, please leave a voicemail as directed and we will return your call as soon as possible. Messages left after 4 pm will be answered the following business day.   You may also send us a message via MyChart. We typically respond to MyChart messages within 1-2 business days.  For prescription refills, please ask your pharmacy to contact our office. Our fax number is [phone].  If you have an urgent issue when the clinic is closed that cannot wait until the next business day, you can page your doctor at the number below.    Please note that while we do our best to be available for urgent issues outside of office hours, we are not available 24/7.   If you have an urgent issue and are unable to reach us, you may choose to seek medical care at your doctor's office, retail clinic, urgent care center, or emergency room.  If you have a medical emergency, please immediately call 911 or go to the emergency department.  Pager Numbers  - Dr. Kowalski: [phone]  - Dr. Moye: [phone]  - Dr. Stewart: [phone]  In the event of inclement weather, please call our main line at [phone] for an update on the status of any delays or closures.  Dermatology Medication Tips: Please keep the boxes that topical medications come in in order to help keep track of the instructions about where and how to use these. Pharmacies typically print the medication instructions only on the boxes and not directly on the medication tubes.   If your medication is too expensive, please contact our office at [phone] option 4 or send us a message through MyChart.   We are unable to tell what your co-pay for medications will be in advance as  this is different depending on your insurance coverage. However, we may be able to find a substitute medication at lower cost or fill out paperwork to get insurance to cover a needed medication.   If a prior authorization is required to get your medication covered by your insurance company, please allow us 1-2 business days to complete this process.  Drug prices often vary depending on where the prescription is filled and some pharmacies may offer cheaper prices.  The website www.goodrx.com contains coupons for medications through different pharmacies. The prices here do not account for what the cost may be with help from insurance (it may be cheaper with your insurance), but the website can give you the price if you did not use any insurance.  - You can print the associated coupon and take it with your prescription to the pharmacy.  - You may also stop by our office during regular business hours and pick up a GoodRx coupon card.  - If you need your prescription sent electronically to a different pharmacy, notify our office through [facility] MyChart or by phone at [phone] option 4.       Si Usted Necesita Algo Despus de Su Visita  Tambin puede enviarnos un mensaje a travs de MyChart. Por lo general respondemos a los mensajes de MyChart en el transcurso de 1 a 2 das hbiles.  Para renovar recetas, por favor pida a su farmacia que se ponga en contacto con nuestra oficina. Nuestro nmero de fax es el [phone].  Si tiene un asunto urgente cuando la clnica est cerrada y que no puede esperar hasta el siguiente da hbil, puede llamar/localizar a su doctor(a) al nmero que aparece a continuacin.   Por favor, tenga en cuenta que aunque hacemos todo lo posible para estar disponibles para asuntos urgentes fuera del horario de oficina, no estamos disponibles las 24 horas del da, los 7 das de la semana.   Si tiene un problema urgente y no puede comunicarse con nosotros, puede optar por  buscar atencin mdica  en el consultorio de su doctor(a), en una clnica privada, en un centro de atencin urgente o en una sala de emergencias.  Si tiene una emergencia mdica, por favor llame inmediatamente al 911 o vaya a la sala de emergencias.  Nmeros de bper  - Dr. Kowalski: [phone]  - Dra. Moye: [phone]  - Dra. Stewart: [phone]  En caso de inclemencias del tiempo, por favor llame a nuestra lnea principal al [phone] para una actualizacin sobre el estado de cualquier retraso o cierre.  Consejos para la medicacin en dermatologa: Por favor, guarde las cajas en las que vienen los medicamentos de uso tpico para ayudarle a seguir las instrucciones sobre dnde y cmo usarlos. Las farmacias generalmente imprimen las instrucciones del medicamento slo en las cajas y no directamente en los tubos del medicamento.   Si su medicamento es muy caro, por favor, pngase en contacto con nuestra oficina llamando al [phone] y presione la opcin 4 o envenos un mensaje a travs de MyChart.   No podemos decirle cul ser su copago por los medicamentos por adelantado ya que esto es diferente dependiendo de la cobertura de su seguro. Sin embargo, es posible que podamos encontrar un medicamento sustituto a menor costo o llenar un formulario para que el seguro cubra el medicamento que se considera necesario.   Si se requiere una autorizacin previa para que su compaa de seguros cubra su medicamento, por favor permtanos de 1 a 2 das hbiles para completar este proceso.  Los precios de los medicamentos varan con frecuencia dependiendo del lugar de dnde se surte la receta y alguna farmacias pueden ofrecer precios ms baratos.  El sitio web www.goodrx.com tiene cupones para medicamentos de diferentes farmacias. Los precios aqu no tienen en cuenta lo que podra costar con la ayuda del seguro (puede ser ms barato con su seguro), pero el sitio web puede darle el precio si no  utiliz ningn seguro.  - Puede imprimir el cupn correspondiente y llevarlo con su receta a la farmacia.  - Tambin puede pasar por nuestra oficina durante el horario de atencin regular y recoger una tarjeta de cupones de GoodRx.  - Si necesita que su receta se enve electrnicamente a una farmacia diferente, informe a nuestra oficina a travs de MyChart de [place] o por telfono llamando al [phone] y presione la opcin 4.  

## 2022-07-03 NOTE — Progress Notes (Signed)
   Follow-Up Visit   Subjective  Aaron Valdez is a 76 y.o. male who presents for the following: Suture / Staple Removal (Patient here for suture removal and to discuss pathology results).  The following portions of the chart were reviewed this encounter and updated as appropriate:  Tobacco  Allergies  Meds  Problems  Med Hx  Surg Hx  Fam Hx      Review of Systems: No other skin or systemic complaints except as noted in HPI or Assessment and Plan.   Objective  Well appearing patient in no apparent distress; mood and affect are within normal limits.  A focused examination was performed including face, right forearm, right calf. Relevant physical exam findings are noted in the Assessment and Plan.  Right cheek Pink papule  Right Forearm - Posterior Incision site is clean, dry and intact  Right Lower Leg - Posterior Ulcer at biopsy site   Assessment & Plan   Encounter for Removal of Sutures - Incision site at the right forearm is clean, dry and intact - Wound cleansed, sutures removed, wound cleansed and steri strips applied.  - Discussed pathology results showing MM  - Patient advised to keep steri-strips dry until they fall off. - Non stick Telfa and Tegaderm patch applied over steri-strips. - Scars remodel for a full year. - Once steri-strips fall off, patient can apply over-the-counter silicone scar cream each night to help with scar remodeling if desired. - Patient advised to call with any concerns or if they notice any new or changing lesions.   Nevus Right cheek  Benign-appearing.  Observation.  Call clinic for new or changing lesions.  Recommend daily use of broad spectrum spf 30+ sunscreen to sun-exposed areas.    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 Breslow's 0.16m, Clark's level III. Reviewed diagnosis, prognosis  [facility]  Pending results will [facility] onc[facility].  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  Scheduled for 3 month FBSE.   Discussed diagnosis in detail including significance of melanoma diagnosis which can be potentially lethal.  Discussed treatment recommendations in detail advising that treatment recommendations are based on longitudinal studies and retrospective studies and are nationwide protocols.  Advised there is always potential for recurrence even after definitive treatment.  After definitive treatment, we recommend total-body skin exams every 3 months for a year; then every 4 months for a year; then every 6 months for 3 years.  At 5 years post treatment, if all looks good we would recommend at least yearly total-body skin exams for the rest of your life.  The patient was given time for questions and these were answered.  We recommend frequent self skin examinations; photoprotection with sunscreen, sun protective clothing, hats, sunglasses and sun avoidance.  If the patient notices any new or changing skin lesions the patient should return to the office immediately for evaluation.    Return for Surgery as scheduled.  I, JEmelia Salisbury CMA, am acting as scribe for VForest Gleason MD.  Documentation: I have reviewed the above documentation for accuracy and completeness, and I agree with the above.  VForest Gleason MD

## 2022-07-03 NOTE — Telephone Encounter (Signed)
Can you please call [facility] and see if there is an update on this pt's path? Thank you!

## 2022-07-05 ENCOUNTER — Encounter: Payer: Self-pay | Admitting: Dermatology

## 2022-07-10 ENCOUNTER — Encounter: Payer: Self-pay | Admitting: Dermatology

## 2022-07-17 ENCOUNTER — Ambulatory Visit (INDEPENDENT_AMBULATORY_CARE_PROVIDER_SITE_OTHER): Payer: Medicare Other | Admitting: Dermatology

## 2022-07-17 DIAGNOSIS — L089 Local infection of the skin and subcutaneous tissue, unspecified: Secondary | ICD-10-CM | POA: Diagnosis not present

## 2022-07-17 MED ORDER — MUPIROCIN 2 % EX OINT
TOPICAL_OINTMENT | CUTANEOUS | 0 refills | Status: DC
Start: 2022-07-17 — End: 2022-08-08

## 2022-07-17 MED ORDER — CEPHALEXIN 500 MG PO CAPS: ORAL_CAPSULE | ORAL | 0 refills | Status: DC

## 2022-07-17 MED ORDER — DOXYCYCLINE MONOHYDRATE 100 MG PO CAPS
100.0000 mg | ORAL_CAPSULE | Freq: Two times a day (BID) | ORAL | 0 refills | Status: DC
Start: 2022-07-17 — End: 2022-07-24

## 2022-07-17 NOTE — Patient Instructions (Signed)
Wound Care Instructions for After Surgery  On the day following your surgery, you should begin doing daily dressing changes until your sutures are removed: Remove the bandage. Cleanse the wound gently with soap and water.  Make sure you then dry the skin surrounding the wound completely or the tape will not stick to the skin. Do not use cotton balls on the wound. After the wound is clean and dry, apply the ointment (either prescription antibiotic prescribed by your doctor or plain Vaseline if nothing was prescribed) gently with a Q-tip. If you are using a bandaid to cover: Apply a bandaid large enough to cover the entire wound. If you do not have a bandaid large enough to cover the wound OR if you are sensitive to bandaid adhesive: Cut a non-stick pad (such as Telfa) to fit the size of the wound.  Cover the wound with the non-stick pad. If the wound is draining, you may want to add a small amount of gauze on top of the non-stick pad for a little added compression to the area. Use tape to seal the area completely.  For the next 1-2 weeks: Be sure to keep the wound moist with ointment 24/7 to ensure best healing. If you are unable to cover the wound with a bandage to hold the ointment in place, you may need to reapply the ointment several times a day. Do not bend over or lift heavy items to reduce the chance of elevated blood pressure to the wound. Do not participate in particularly strenuous activities.  Below is a list of dressing supplies you might need.  Cotton-tipped applicators - Q-tips Gauze pads (2x2 and/or 4x4) - All-Purpose Sponges New and clean tube of petroleum jelly (Vaseline) OR prescription antibiotic ointment if prescribed Either a bandaid large enough to cover the entire wound OR non-stick dressing material (Telfa) and Tape (Paper or Hypafix)  FOR ADULT SURGERY PATIENTS: If you need something for pain relief, you may take 1 extra strength Tylenol (acetaminophen) and 2  ibuprofen (200 mg) together every 4 hours as needed. (Do not take these medications if you are allergic to them or if you know you cannot take them for any other reason). Typically you may only need pain medication for 1-3 days.   Comments on the Post-Operative Period Slight swelling and redness often appear around the wound. This is normal and will disappear within several days following the surgery. The healing wound will drain a brownish-red-yellow discharge during healing. This is a normal phase of wound healing. As the wound begins to heal, the drainage may increase in amount. Again, this drainage is normal. Notify us if the drainage becomes persistently bloody, excessively swollen, or intensely painful or develops a foul odor or red streaks.  The healing wound will also typically be itchy. This is normal. If you have severe or persistent pain, Notify us if the discomfort is severe or persistent. Avoid alcoholic beverages when taking pain medicine.  In Case of Wound Hemorrhage A wound hemorrhage is when the bandage suddenly becomes soaked with bright red blood and flows profusely. If this happens, sit down or lie down with your head elevated. If the wound has a dressing on it, do not remove the dressing. Apply pressure to the existing gauze. If the wound is not covered, use a gauze pad to apply pressure and continue applying the pressure for 20 minutes without peeking. DO NOT COVER THE WOUND WITH A LARGE TOWEL OR WASH CLOTH. Release your hand from the   wound site but do not remove the dressing. If the bleeding has stopped, gently clean around the wound. Leave the dressing in place for 24 hours if possible. This wait time allows the blood vessels to close off so that you do not spark a new round of bleeding by disrupting the newly clotted blood vessels with an immediate dressing change. If the bleeding does not subside, continue to hold pressure for 40 minutes. If bleeding continues, page your  physician, contact an After Hours clinic or go to the Emergency Room.  Due to recent changes in healthcare laws, you may see results of your pathology and/or laboratory studies on MyChart before the doctors have had a chance to review them. We understand that in some cases there may be results that are confusing or concerning to you. Please understand that not all results are received at the same time and often the doctors may need to interpret multiple results in order to provide you with the best plan of care or course of treatment. Therefore, we ask that you please give us 2 business days to thoroughly review all your results before contacting the office for clarification. Should we see a critical lab result, you will be contacted sooner.   If You Need Anything After Your Visit  If you have any questions or concerns for your doctor, please call our main line at [phone] and press option 4 to reach your doctor's medical assistant. If no one answers, please leave a voicemail as directed and we will return your call as soon as possible. Messages left after 4 pm will be answered the following business day.   You may also send us a message via MyChart. We typically respond to MyChart messages within 1-2 business days.  For prescription refills, please ask your pharmacy to contact our office. Our fax number is [phone].  If you have an urgent issue when the clinic is closed that cannot wait until the next business day, you can page your doctor at the number below.    Please note that while we do our best to be available for urgent issues outside of office hours, we are not available 24/7.   If you have an urgent issue and are unable to reach us, you may choose to seek medical care at your doctor's office, retail clinic, urgent care center, or emergency room.  If you have a medical emergency, please immediately call 911 or go to the emergency department.  Pager Numbers  - Dr. Kowalski:  [phone]  - Dr. Moye: [phone]  - Dr. Stewart: [phone]  In the event of inclement weather, please call our main line at [phone] for an update on the status of any delays or closures.  Dermatology Medication Tips: Please keep the boxes that topical medications come in in order to help keep track of the instructions about where and how to use these. Pharmacies typically print the medication instructions only on the boxes and not directly on the medication tubes.   If your medication is too expensive, please contact our office at [phone] option 4 or send us a message through MyChart.   We are unable to tell what your co-pay for medications will be in advance as this is different depending on your insurance coverage. However, we may be able to find a substitute medication at lower cost or fill out paperwork to get insurance to cover a needed medication.   If a prior authorization is required to get your medication covered by   your insurance company, please allow us 1-2 business days to complete this process.  Drug prices often vary depending on where the prescription is filled and some pharmacies may offer cheaper prices.  The website www.goodrx.com contains coupons for medications through different pharmacies. The prices here do not account for what the cost may be with help from insurance (it may be cheaper with your insurance), but the website can give you the price if you did not use any insurance.  - You can print the associated coupon and take it with your prescription to the pharmacy.  - You may also stop by our office during regular business hours and pick up a GoodRx coupon card.  - If you need your prescription sent electronically to a different pharmacy, notify our office through [facility] MyChart or by phone at [phone] option 4.     Si Usted Necesita Algo Despus de Su Visita  Tambin puede enviarnos un mensaje a travs de MyChart. Por lo general  respondemos a los mensajes de MyChart en el transcurso de 1 a 2 das hbiles.  Para renovar recetas, por favor pida a su farmacia que se ponga en contacto con nuestra oficina. Nuestro nmero de fax es el [phone].  Si tiene un asunto urgente cuando la clnica est cerrada y que no puede esperar hasta el siguiente da hbil, puede llamar/localizar a su doctor(a) al nmero que aparece a continuacin.   Por favor, tenga en cuenta que aunque hacemos todo lo posible para estar disponibles para asuntos urgentes fuera del horario de oficina, no estamos disponibles las 24 horas del da, los 7 das de la semana.   Si tiene un problema urgente y no puede comunicarse con nosotros, puede optar por buscar atencin mdica  en el consultorio de su doctor(a), en una clnica privada, en un centro de atencin urgente o en una sala de emergencias.  Si tiene una emergencia mdica, por favor llame inmediatamente al 911 o vaya a la sala de emergencias.  Nmeros de bper  - Dr. Kowalski: [phone]  - Dra. Moye: [phone]  - Dra. Stewart: [phone]  En caso de inclemencias del tiempo, por favor llame a nuestra lnea principal al [phone] para una actualizacin sobre el estado de cualquier retraso o cierre.  Consejos para la medicacin en dermatologa: Por favor, guarde las cajas en las que vienen los medicamentos de uso tpico para ayudarle a seguir las instrucciones sobre dnde y cmo usarlos. Las farmacias generalmente imprimen las instrucciones del medicamento slo en las cajas y no directamente en los tubos del medicamento.   Si su medicamento es muy caro, por favor, pngase en contacto con nuestra oficina llamando al [phone] y presione la opcin 4 o envenos un mensaje a travs de MyChart.   No podemos decirle cul ser su copago por los medicamentos por adelantado ya que esto es diferente dependiendo de la cobertura de su seguro. Sin embargo, es posible que podamos encontrar un  medicamento sustituto a menor costo o llenar un formulario para que el seguro cubra el medicamento que se considera necesario.   Si se requiere una autorizacin previa para que su compaa de seguros cubra su medicamento, por favor permtanos de 1 a 2 das hbiles para completar este proceso.  Los precios de los medicamentos varan con frecuencia dependiendo del lugar de dnde se surte la receta y alguna farmacias pueden ofrecer precios ms baratos.  El sitio web www.goodrx.com tiene cupones para medicamentos de diferentes farmacias. Los precios aqu no tienen   en cuenta lo que podra costar con la ayuda del seguro (puede ser ms barato con su seguro), pero el sitio web puede darle el precio si no utiliz ningn seguro.  - Puede imprimir el cupn correspondiente y llevarlo con su receta a la farmacia.  - Tambin puede pasar por nuestra oficina durante el horario de atencin regular y recoger una tarjeta de cupones de GoodRx.  - Si necesita que su receta se enve electrnicamente a una farmacia diferente, informe a nuestra oficina a travs de MyChart de [facility] o por telfono llamando al [phone] y presione la opcin 4.  

## 2022-07-17 NOTE — Progress Notes (Signed)
   Follow-Up Visit   Subjective  Aaron Valdez is a 76 y.o. 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 ).  Patient advises area to be excised today is tender and does not look good.   The following portions of the chart were reviewed this encounter and updated as appropriate:   Tobacco  Allergies  Meds  Problems  Med Hx  Surg Hx  Fam Hx      Review of Systems:  No other skin or systemic complaints except as noted in HPI or Assessment and Plan.  Objective  Well appearing patient in no apparent distress; mood and affect are within normal limits.  A focused examination was performed including right leg. Relevant physical exam findings are noted in the Assessment and Plan.  right calf Erythema, pustules and erosions        Assessment & Plan  Local infection of skin and subcutaneous tissue right calf  Defer surgery today  Start mupirocin 2-3 times to affected area and cover with bandage. Start doxycycline monohydrate 100 mg bid x 7 days with food.   Doxycycline should be taken with food to prevent nausea. Do not lay down for 30 minutes after taking. Be cautious with sun exposure and use good sun protection while on this medication. Pregnant women should not take this medication.    Related Procedures Anaerobic and Aerobic Culture    Return for as scheduled.  Graciella Belton, RMA, am acting as scribe for Forest Gleason, MD .  Documentation: I have reviewed the above documentation for accuracy and completeness, and I agree with the above.  Forest Gleason, MD

## 2022-07-18 ENCOUNTER — Telehealth: Payer: Self-pay

## 2022-07-18 NOTE — Telephone Encounter (Signed)
Results reviewed. Class IA. Proceed with WLE as planned. Will discuss at excision appointment per pt request.

## 2022-07-18 NOTE — Telephone Encounter (Signed)
Left message for patient that we need to schedule his surgery from 11/22 to 11/7 at 1pm. Appt changed and patient to call if that does not work for him.  Lurlean Horns., RMA

## 2022-07-18 NOTE — Telephone Encounter (Signed)
[facility] testing results scanned in under Media tab for your review.

## 2022-07-22 ENCOUNTER — Encounter: Payer: Self-pay | Admitting: Dermatology

## 2022-07-24 ENCOUNTER — Ambulatory Visit: Payer: Medicare Other | Admitting: Dermatology

## 2022-07-24 ENCOUNTER — Encounter: Payer: Self-pay | Admitting: Dermatology

## 2022-07-24 ENCOUNTER — Encounter: Payer: Medicare Other | Admitting: Dermatology

## 2022-07-24 DIAGNOSIS — D0361 Melanoma in situ of right upper limb, including shoulder: Secondary | ICD-10-CM

## 2022-07-24 DIAGNOSIS — S81801A Unspecified open wound, right lower leg, initial encounter: Secondary | ICD-10-CM

## 2022-07-24 DIAGNOSIS — T148XXA Other injury of unspecified body region, initial encounter: Secondary | ICD-10-CM

## 2022-07-24 DIAGNOSIS — C439 Malignant melanoma of skin, unspecified: Secondary | ICD-10-CM

## 2022-07-24 LAB — ANAEROBIC AND AEROBIC CULTURE

## 2022-07-24 MED ORDER — DOXYCYCLINE MONOHYDRATE 100 MG PO CAPS
100.0000 mg | ORAL_CAPSULE | Freq: Two times a day (BID) | ORAL | 0 refills | Status: DC
Start: 2022-07-24 — End: 2022-07-31

## 2022-07-24 NOTE — Progress Notes (Signed)
Follow-Up Visit   Subjective  Aaron Valdez is a 76 y.o. male who presents for the following: Procedure (Patient here today for excision of bx proven MM at right forearm.).   The following portions of the chart were reviewed this encounter and updated as appropriate:   Tobacco  Allergies  Meds  Problems  Med Hx  Surg Hx  Fam Hx      Review of Systems:  No other skin or systemic complaints except as noted in HPI or Assessment and Plan.  Objective  Well appearing patient in no apparent distress; mood and affect are within normal limits.  A focused examination was performed including right arm. Relevant physical exam findings are noted in the Assessment and Plan.  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  Skin repair Complexity:  Complex Final length (cm):  7.5 Informed consent: discussed and consent obtained   Timeout: patient name, date of birth, surgical site, and procedure verified   Procedure prep:  Patient was prepped and draped in usual sterile fashion Prep type:  Chlorhexidine Anesthesia: the lesion was anesthetized in a standard fashion   Anesthetic:  1% lidocaine w/ epinephrine 1-100,000 local infiltration Undermining: area extensively undermined   Subcutaneous layers (deep stitches):  Suture size:  3-0 and 2-0 Suture type: Vicryl (polyglactin 910)   Fine/surface layer approximation (top stitches):  Suture size:  4-0 Suture type: Prolene (polypropylene)   Suture removal (days):  14 Hemostasis achieved with: suture, pressure and electrodesiccation Outcome: patient tolerated procedure well with no complications   Post-procedure details: wound care instructions given   Additional details:  Central area approx 2.3 cm left to heal by secondary intention due to high tension. Final length 8.8 cm. 7.5 cm closed  linearly today. Discussed option of flap vs leaving center for secondary intent healing and pt prefers secondary intent at center portion. Extensive undermining greater than the maximum width of the defect along at least one entire edge of the defect was performed Maximum width of defect perpendicular to line of the closure 2.8 cm Width of undermining done 4.5 cm  Mupirocin and a pressure dressing applied  Skin excision  Lesion length (cm):  2.3 Margin per side (cm):  1 Total excision diameter (cm):  4.3 Informed consent: discussed and consent obtained   Timeout: patient name, date of birth, surgical site, and procedure verified   Procedure prep:  Patient was prepped and draped in usual sterile fashion Prep type:  Chlorhexidine Anesthesia: the lesion was anesthetized in a standard fashion   Anesthetic:  1% lidocaine w/ epinephrine 1-100,000 buffered w/ 8.4% NaHCO3 (6cc lido w/epi, 6cc bupivicaine) Instrument used: #15 blade   Hemostasis achieved with: pressure and electrodesiccation   Outcome: patient tolerated procedure well with no complications   Post-procedure details: sterile dressing applied   Dressing type: pressure dressing   Additional details:  Excised down to fascia  Specimen 1 - Surgical pathology Differential Diagnosis: R/O residual Malignant Melanoma  Check Margins: Yes Tagged at medial   Previous Bx: [id number]  Return in 1 week for wound check, DO NOT REMOVE SUTURES. Suture removal in 2 weeks  Continue doxycycline monohydrate 100 mg twice daily with food x 1 week  Doxycycline should be taken with food to prevent nausea. Do not lay down for 30 minutes after taking. Be cautious with sun exposure and use  good sun protection while on this medication. Pregnant women should not take this medication.    Open wound Right Lower Leg - Anterior  No evidence of infection today. Need to heal erosions from bandaging before excision of MMIS due to infection risk. Start  StrataGRT/strataMed silicone wound gel to wound and erosions twice a day. Keep area covered with bandage and wrap.   Return for wound check in 1 week (Do Not Remove Sutures), suture removal in 2 weeks.  Graciella Belton, RMA, am acting as scribe for Forest Gleason, MD .  Documentation: I have reviewed the above documentation for accuracy and completeness, and I agree with the above.  Forest Gleason, MD

## 2022-07-24 NOTE — Patient Instructions (Signed)
Wound Care Instructions for After Surgery  On the day following your surgery, you should begin doing daily dressing changes until your sutures are removed: Remove the bandage. Cleanse the wound gently with soap and water.  Make sure you then dry the skin surrounding the wound completely or the tape will not stick to the skin. Do not use cotton balls on the wound. After the wound is clean and dry, apply the ointment (either prescription antibiotic prescribed by your doctor or plain Vaseline if nothing was prescribed) gently with a Q-tip. If you are using a bandaid to cover: Apply a bandaid large enough to cover the entire wound. If you do not have a bandaid large enough to cover the wound OR if you are sensitive to bandaid adhesive: Cut a non-stick pad (such as Telfa) to fit the size of the wound.  Cover the wound with the non-stick pad. If the wound is draining, you may want to add a small amount of gauze on top of the non-stick pad for a little added compression to the area. Use tape to seal the area completely.  For the next 1-2 weeks: Be sure to keep the wound moist with ointment 24/7 to ensure best healing. If you are unable to cover the wound with a bandage to hold the ointment in place, you may need to reapply the ointment several times a day. Do not bend over or lift heavy items to reduce the chance of elevated blood pressure to the wound. Do not participate in particularly strenuous activities.  Below is a list of dressing supplies you might need.  Cotton-tipped applicators - Q-tips Gauze pads (2x2 and/or 4x4) - All-Purpose Sponges New and clean tube of petroleum jelly (Vaseline) OR prescription antibiotic ointment if prescribed Either a bandaid large enough to cover the entire wound OR non-stick dressing material (Telfa) and Tape (Paper or Hypafix)  FOR ADULT SURGERY PATIENTS: If you need something for pain relief, you may take 1 extra strength Tylenol (acetaminophen) and 2  ibuprofen (200 mg) together every 4 hours as needed. (Do not take these medications if you are allergic to them or if you know you cannot take them for any other reason). Typically you may only need pain medication for 1-3 days.   Comments on the Post-Operative Period Slight swelling and redness often appear around the wound. This is normal and will disappear within several days following the surgery. The healing wound will drain a brownish-red-yellow discharge during healing. This is a normal phase of wound healing. As the wound begins to heal, the drainage may increase in amount. Again, this drainage is normal. Notify us if the drainage becomes persistently bloody, excessively swollen, or intensely painful or develops a foul odor or red streaks.  The healing wound will also typically be itchy. This is normal. If you have severe or persistent pain, Notify us if the discomfort is severe or persistent. Avoid alcoholic beverages when taking pain medicine.  In Case of Wound Hemorrhage A wound hemorrhage is when the bandage suddenly becomes soaked with bright red blood and flows profusely. If this happens, sit down or lie down with your head elevated. If the wound has a dressing on it, do not remove the dressing. Apply pressure to the existing gauze. If the wound is not covered, use a gauze pad to apply pressure and continue applying the pressure for 20 minutes without peeking. DO NOT COVER THE WOUND WITH A LARGE TOWEL OR WASH CLOTH. Release your hand from the   wound site but do not remove the dressing. If the bleeding has stopped, gently clean around the wound. Leave the dressing in place for 24 hours if possible. This wait time allows the blood vessels to close off so that you do not spark a new round of bleeding by disrupting the newly clotted blood vessels with an immediate dressing change. If the bleeding does not subside, continue to hold pressure for 40 minutes. If bleeding continues, page your  physician, contact an After Hours clinic or go to the Emergency Room.  Due to recent changes in healthcare laws, you may see results of your pathology and/or laboratory studies on MyChart before the doctors have had a chance to review them. We understand that in some cases there may be results that are confusing or concerning to you. Please understand that not all results are received at the same time and often the doctors may need to interpret multiple results in order to provide you with the best plan of care or course of treatment. Therefore, we ask that you please give us 2 business days to thoroughly review all your results before contacting the office for clarification. Should we see a critical lab result, you will be contacted sooner.   If You Need Anything After Your Visit  If you have any questions or concerns for your doctor, please call our main line at [phone] and press option 4 to reach your doctor's medical assistant. If no one answers, please leave a voicemail as directed and we will return your call as soon as possible. Messages left after 4 pm will be answered the following business day.   You may also send us a message via MyChart. We typically respond to MyChart messages within 1-2 business days.  For prescription refills, please ask your pharmacy to contact our office. Our fax number is [phone].  If you have an urgent issue when the clinic is closed that cannot wait until the next business day, you can page your doctor at the number below.    Please note that while we do our best to be available for urgent issues outside of office hours, we are not available 24/7.   If you have an urgent issue and are unable to reach us, you may choose to seek medical care at your doctor's office, retail clinic, urgent care center, or emergency room.  If you have a medical emergency, please immediately call 911 or go to the emergency department.  Pager Numbers  - Dr. Kowalski:  [phone]  - Dr. Moye: [phone]  - Dr. Stewart: [phone]  In the event of inclement weather, please call our main line at [phone] for an update on the status of any delays or closures.  Dermatology Medication Tips: Please keep the boxes that topical medications come in in order to help keep track of the instructions about where and how to use these. Pharmacies typically print the medication instructions only on the boxes and not directly on the medication tubes.   If your medication is too expensive, please contact our office at [phone] option 4 or send us a message through MyChart.   We are unable to tell what your co-pay for medications will be in advance as this is different depending on your insurance coverage. However, we may be able to find a substitute medication at lower cost or fill out paperwork to get insurance to cover a needed medication.   If a prior authorization is required to get your medication covered by   your insurance company, please allow us 1-2 business days to complete this process.  Drug prices often vary depending on where the prescription is filled and some pharmacies may offer cheaper prices.  The website www.goodrx.com contains coupons for medications through different pharmacies. The prices here do not account for what the cost may be with help from insurance (it may be cheaper with your insurance), but the website can give you the price if you did not use any insurance.  - You can print the associated coupon and take it with your prescription to the pharmacy.  - You may also stop by our office during regular business hours and pick up a GoodRx coupon card.  - If you need your prescription sent electronically to a different pharmacy, notify our office through [place] MyChart or by phone at [phone] option 4.     Si Usted Necesita Algo Despus de Su Visita  Tambin puede enviarnos un mensaje a travs de MyChart. Por lo general  respondemos a los mensajes de MyChart en el transcurso de 1 a 2 das hbiles.  Para renovar recetas, por favor pida a su farmacia que se ponga en contacto con nuestra oficina. Nuestro nmero de fax es el [phone].  Si tiene un asunto urgente cuando la clnica est cerrada y que no puede esperar hasta el siguiente da hbil, puede llamar/localizar a su doctor(a) al nmero que aparece a continuacin.   Por favor, tenga en cuenta que aunque hacemos todo lo posible para estar disponibles para asuntos urgentes fuera del horario de oficina, no estamos disponibles las 24 horas del da, los 7 das de la semana.   Si tiene un problema urgente y no puede comunicarse con nosotros, puede optar por buscar atencin mdica  en el consultorio de su doctor(a), en una clnica privada, en un centro de atencin urgente o en una sala de emergencias.  Si tiene una emergencia mdica, por favor llame inmediatamente al 911 o vaya a la sala de emergencias.  Nmeros de bper  - Dr. Kowalski: [phone]  - Dra. Moye: [phone]  - Dra. Stewart: [phone]  En caso de inclemencias del tiempo, por favor llame a nuestra lnea principal al [phone] para una actualizacin sobre el estado de cualquier retraso o cierre.  Consejos para la medicacin en dermatologa: Por favor, guarde las cajas en las que vienen los medicamentos de uso tpico para ayudarle a seguir las instrucciones sobre dnde y cmo usarlos. Las farmacias generalmente imprimen las instrucciones del medicamento slo en las cajas y no directamente en los tubos del medicamento.   Si su medicamento es muy caro, por favor, pngase en contacto con nuestra oficina llamando al [phone] y presione la opcin 4 o envenos un mensaje a travs de MyChart.   No podemos decirle cul ser su copago por los medicamentos por adelantado ya que esto es diferente dependiendo de la cobertura de su seguro. Sin embargo, es posible que podamos encontrar un  medicamento sustituto a menor costo o llenar un formulario para que el seguro cubra el medicamento que se considera necesario.   Si se requiere una autorizacin previa para que su compaa de seguros cubra su medicamento, por favor permtanos de 1 a 2 das hbiles para completar este proceso.  Los precios de los medicamentos varan con frecuencia dependiendo del lugar de dnde se surte la receta y alguna farmacias pueden ofrecer precios ms baratos.  El sitio web www.goodrx.com tiene cupones para medicamentos de diferentes farmacias. Los precios aqu no tienen   en cuenta lo que podra costar con la ayuda del seguro (puede ser ms barato con su seguro), pero el sitio web puede darle el precio si no utiliz ningn seguro.  - Puede imprimir el cupn correspondiente y llevarlo con su receta a la farmacia.  - Tambin puede pasar por nuestra oficina durante el horario de atencin regular y recoger una tarjeta de cupones de GoodRx.  - Si necesita que su receta se enve electrnicamente a una farmacia diferente, informe a nuestra oficina a travs de MyChart de [place] o por telfono llamando al [phone] y presione la opcin 4.  

## 2022-07-25 ENCOUNTER — Telehealth: Payer: Self-pay

## 2022-07-25 ENCOUNTER — Other Ambulatory Visit: Payer: Self-pay

## 2022-07-25 NOTE — Telephone Encounter (Signed)
Called pt to see how he is doing post surgery, pt report he is doing well, he changed his bandage this morning with no problems, pt instructed to stop by here to pick up sample of Stratamed wound dressing to use at right calf twice a day with a clean Q-tip, pt can get the stratamed from the [employer],

## 2022-07-25 NOTE — Telephone Encounter (Signed)
Called Left message on the voicemail we will not send a rx for Stratamed wound dressing  to his local pharmacy we will give him a sample tube at his office visit next week

## 2022-07-25 NOTE — Telephone Encounter (Signed)
LVM for pt to return my call. Called to check on him following yesterdays surgery and to offer him a sample and rx of Stratamed wound dressing to use at right calf. Lurlean Horns., RMA

## 2022-07-30 ENCOUNTER — Telehealth: Payer: Self-pay

## 2022-07-30 ENCOUNTER — Encounter: Payer: Medicare Other | Admitting: Dermatology

## 2022-07-30 NOTE — Telephone Encounter (Signed)
LMOVM margins free. Tira for suture removal 07/31/2022.

## 2022-07-30 NOTE — Telephone Encounter (Signed)
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 No additional treatment needed at this time. Will recheck surgical site at follow-up.  MAs please call. Thank you!

## 2022-07-31 ENCOUNTER — Encounter: Payer: Self-pay | Admitting: Dermatology

## 2022-07-31 ENCOUNTER — Ambulatory Visit: Payer: Medicare Other | Admitting: Dermatology

## 2022-07-31 DIAGNOSIS — E119 Type 2 diabetes mellitus without complications: Secondary | ICD-10-CM | POA: Diagnosis not present

## 2022-07-31 DIAGNOSIS — D0371 Melanoma in situ of right lower limb, including hip: Secondary | ICD-10-CM

## 2022-07-31 DIAGNOSIS — Z4889 Encounter for other specified surgical aftercare: Secondary | ICD-10-CM

## 2022-07-31 MED ORDER — DOXYCYCLINE MONOHYDRATE 100 MG PO CAPS: 100.0000 mg | ORAL_CAPSULE | Freq: Two times a day (BID) | ORAL | 0 refills | Status: AC

## 2022-07-31 NOTE — Progress Notes (Signed)
   Follow-Up Visit   Subjective  Aaron Valdez is a 76 y.o. male who presents for the following: Wound Check (Right forearm. MM excision site).    The following portions of the chart were reviewed this encounter and updated as appropriate:  Tobacco  Allergies  Meds  Problems  Med Hx  Surg Hx  Fam Hx      Review of Systems: No other skin or systemic complaints except as noted in HPI or Assessment and Plan.   Objective  Well appearing patient in no apparent distress; mood and affect are within normal limits.  A focused examination was performed including right forearm, right leg. Relevant physical exam findings are noted in the Assessment and Plan.  Right Forearm, right leg Healing linear surgical wound.       right calf Ulcerated biopsy site with eschar   Assessment & Plan  Encounter for post surgical wound check Right Forearm, right leg  Continue Hibiclens. Continue Doxycycline twice daily for 7 more days. Recheck and suture removal next week.    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  Sample given today.  RTC as scheduled for surgery, MIS.   Return for Surgery As Scheduled.  I, Emelia Salisbury, CMA, am acting as scribe for Forest Gleason, MD.  Documentation: I have reviewed the above documentation for accuracy and completeness, and I agree with the above.  Forest Gleason, MD

## 2022-07-31 NOTE — Patient Instructions (Signed)
Continue Hibiclens. Continue Doxycycline twice daily.   Continue Strata gel to wound on leg twice daily.   Doxycycline should be taken with food to prevent nausea. Do not lay down for 30 minutes after taking. Be cautious with sun exposure and use good sun protection while on this medication. Pregnant women should not take this medication.       Pre-Operative Instructions  You are scheduled for a surgical procedure at [facility]. We recommend you read the following instructions. If you have any questions or concerns, please call the office at [phone].  Shower and wash the entire body with soap and water the day of your surgery paying special attention to cleansing at and around the planned surgery site.  Avoid aspirin or aspirin containing products at least fourteen (14) days prior to your surgical procedure and for at least one week (7 Days) after your surgical procedure. If you take aspirin on a regular basis for heart disease or history of stroke or for any other reason, we may recommend you continue taking aspirin but please notify us if you take this on a regular basis. Aspirin can cause more bleeding to occur during surgery as well as prolonged bleeding and bruising after surgery.   Avoid other nonsteroidal pain medications at least one week prior to surgery and at least one week prior to your surgery. These include medications such as Ibuprofen (Motrin, Advil and Nuprin), Naprosyn, Voltaren, Relafen, etc. If medications are used for therapeutic reasons, please inform us as they can cause increased bleeding or prolonged bleeding during and bruising after surgical procedures.   Please advise [place] if you are taking any "blood thinner" medications such as Coumadin or Dipyridamole or Plavix or similar medications. These cause increased bleeding and prolonged bleeding during procedures and bruising after surgical procedures. We may have to consider discontinuing these medications  briefly prior to and shortly after your surgery if safe to do so.   Please inform us of all medications you are currently taking. All medications that are taken regularly should be taken the day of surgery as you always do. Nevertheless, we need to be informed of what medications you are taking prior to surgery to know whether they will affect the procedure or cause any complications.   Please inform us of any medication allergies. Also inform us of whether you have allergies to Latex or rubber products or whether you have had any adverse reaction to Lidocaine or Epinephrine.  Please inform us of any prosthetic or artificial body parts such as artificial heart valve, joint replacements, etc., or similar condition that might require preoperative antibiotics.   We recommend avoidance of alcohol at least two weeks prior to surgery and continued avoidance for at least two weeks after surgery.   We recommend discontinuation of tobacco smoking at least two weeks prior to surgery and continued abstinence for at least two weeks after surgery.  Do not plan strenuous exercise, strenuous work or strenuous lifting for approximately four weeks after your surgery.   We request if you are unable to make your scheduled surgical appointment, please call us at least a week in advance or as soon as you are aware of a problem so that we can cancel or reschedule the appointment.   You MAY TAKE TYLENOL (acetaminophen) for pain as it is not a blood thinner.   PLEASE PLAN TO BE IN TOWN FOR TWO WEEKS FOLLOWING SURGERY, THIS IS IMPORTANT SO YOU CAN BE CHECKED FOR DRESSING CHANGES, SUTURE  REMOVAL AND TO MONITOR FOR POSSIBLE COMPLICATIONS.  Due to recent changes in healthcare laws, you may see results of your pathology and/or laboratory studies on MyChart before the doctors have had a chance to review them. We understand that in some cases there may be results that are confusing or concerning to you. Please understand that  not all results are received at the same time and often the doctors may need to interpret multiple results in order to provide you with the best plan of care or course of treatment. Therefore, we ask that you please give [place] 2 business days to thoroughly review all your results before contacting the office for clarification. Should we see a critical lab result, you will be contacted sooner.   If You Need Anything After Your Visit  If you have any questions or concerns for your doctor, please call our main line at [phone] and press option 4 to reach your doctor's medical assistant. If no one answers, please leave a voicemail as directed and we will return your call as soon as possible. Messages left after 4 pm will be answered the following business day.   You may also send [place] a message via [place]. We typically respond to MyChart messages within 1-2 business days.  For prescription refills, please ask your pharmacy to contact our office. Our fax number is [phone].  If you have an urgent issue when the clinic is closed that cannot wait until the next business day, you can page your doctor at the number below.    Please note that while we do our best to be available for urgent issues outside of office hours, we are not available 24/7.   If you have an urgent issue and are unable to reach [place], you may choose to seek medical care at your doctor's office, retail clinic, urgent care center, or emergency room.  If you have a medical emergency, please immediately call 911 or go to the emergency department.  Pager Numbers  - Dr. Nehemiah Massed: [phone]  - Dr. Laurence Ferrari: [phone]  - Dr. Nicole Kindred: [phone]  In the event of inclement weather, please call our main line at [phone] for an update on the status of any delays or closures.  Dermatology Medication Tips: Please keep the boxes that topical medications come in in order to help keep track of the instructions about where and how  to use these. Pharmacies typically print the medication instructions only on the boxes and not directly on the medication tubes.   If your medication is too expensive, please contact our office at [phone] option 4 or send [place] a message through [place].   We are unable to tell what your co-pay for medications will be in advance as this is different depending on your insurance coverage. However, we may be able to find a substitute medication at lower cost or fill out paperwork to get insurance to cover a needed medication.   If a prior authorization is required to get your medication covered by your insurance company, please allow [place] 1-2 business days to complete this process.  Drug prices often vary depending on where the prescription is filled and some pharmacies may offer cheaper prices.  The website www.goodrx.com contains coupons for medications through different pharmacies. The prices here do not account for what the cost may be with help from insurance (it may be cheaper with your insurance), but the website can give you the price if you did not use any insurance.  - You  can print the associated coupon and take it with your prescription to the pharmacy.  - You may also stop by our office during regular business hours and pick up a GoodRx coupon card.  - If you need your prescription sent electronically to a different pharmacy, notify our office through [facility] or by phone at [phone] option 4.     Si Usted Necesita Algo Despus de Su Visita  Tambin puede enviarnos un mensaje a travs de Pharmacist, community. Por lo general respondemos a los mensajes de MyChart en el transcurso de 1 a 2 das hbiles.  Para renovar recetas, por favor pida a su farmacia que se ponga en contacto con nuestra oficina. Harland Dingwall de fax es [place] [phone].  Si tiene un asunto urgente cuando la clnica est cerrada y que no puede esperar hasta el siguiente da hbil, puede llamar/localizar a su  doctor(a) al nmero que aparece a continuacin.   Por favor, tenga en cuenta que aunque hacemos todo lo posible para estar disponibles para asuntos urgentes fuera del horario de [place], no estamos disponibles las 24 horas del da, los 7 das de la [place].   Si tiene un problema urgente y no puede comunicarse con nosotros, puede optar por buscar atencin mdica  en el consultorio de su doctor(a), en una clnica privada, en un centro de atencin urgente o en una sala de emergencias.  Si tiene Engineering geologist, por favor llame inmediatamente al 911 o vaya a la sala de emergencias.  Nmeros de bper  - Dr. Nehemiah Massed: [phone]  - Dra. Moye: [phone]  - Dra. Nicole Kindred: [phone]  En caso de inclemencias del [place], por favor llame a Johnsie Kindred principal al [phone] para una actualizacin sobre el [place] de cualquier retraso o cierre.  Consejos para la medicacin en dermatologa: Por favor, guarde las cajas en las que vienen los medicamentos de uso tpico para ayudarle a seguir las instrucciones sobre dnde y cmo usarlos. Las farmacias generalmente imprimen las instrucciones del medicamento slo en las cajas y no directamente en los tubos del [place].   Si su medicamento es muy caro, por favor, pngase en contacto con Zigmund Daniel llamando al [phone] y presione la opcin 4 o envenos un mensaje a travs de Pharmacist, community.   No podemos decirle cul ser su copago por los medicamentos por adelantado ya que esto es diferente dependiendo de la cobertura de su seguro. Sin embargo, es posible que podamos encontrar un medicamento sustituto a Electrical engineer un formulario para que el seguro cubra el medicamento que se considera necesario.   Si se requiere una autorizacin previa para que su compaa de seguros [place] su medicamento, por favor permtanos de 1 a 2 das hbiles para completar este proceso.  Los precios de los medicamentos varan con frecuencia dependiendo del  Environmental consultant de dnde se surte la receta y alguna farmacias pueden ofrecer precios ms baratos.  El sitio web www.goodrx.com tiene cupones para medicamentos de Airline pilot. Los precios aqu no tienen en cuenta lo que podra costar con la ayuda del seguro (puede ser ms barato con su seguro), pero el sitio web puede darle el precio si no utiliz Research scientist (physical sciences).  - Puede imprimir el cupn correspondiente y llevarlo con su receta a la farmacia.  - Tambin puede pasar por nuestra oficina durante el horario de atencin regular y Charity fundraiser una tarjeta de cupones de GoodRx.  - Si necesita que su receta se enve electrnicamente a una farmacia diferente, informe a  nuestra oficina a travs de MyChart de [place] o por telfono llamando al [phone] y presione la opcin 4.

## 2022-08-05 ENCOUNTER — Encounter: Payer: Self-pay | Admitting: Dermatology

## 2022-08-06 ENCOUNTER — Encounter: Payer: Self-pay | Admitting: Dermatology

## 2022-08-06 ENCOUNTER — Ambulatory Visit (INDEPENDENT_AMBULATORY_CARE_PROVIDER_SITE_OTHER): Payer: Medicare Other | Admitting: Dermatology

## 2022-08-06 DIAGNOSIS — L988 Other specified disorders of the skin and subcutaneous tissue: Secondary | ICD-10-CM | POA: Diagnosis not present

## 2022-08-06 DIAGNOSIS — D0371 Melanoma in situ of right lower limb, including hip: Secondary | ICD-10-CM | POA: Diagnosis not present

## 2022-08-06 NOTE — Progress Notes (Signed)
Follow-Up Visit   Subjective  Aaron Valdez is a 76 y.o. 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  The following portions of the chart were reviewed this encounter and updated as appropriate:   Tobacco  Allergies  Meds  Problems  Med Hx  Surg Hx  Fam Hx      Review of Systems:  No other skin or systemic complaints except as noted in HPI or Assessment and Plan.  Objective  Well appearing patient in no apparent distress; mood and affect are within normal limits.  A focused examination was performed including legs, arms. Relevant physical exam findings are noted in the Assessment and Plan.  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  Skin excision  Lesion length (cm):  2.1 Total excision diameter (cm):  4.1 Informed consent: discussed and consent obtained   Timeout: patient name, date of birth, surgical site, and procedure verified   Procedure prep:  Patient was prepped and draped in usual sterile fashion Prep type:  Chlorhexidine Anesthesia: the lesion was anesthetized in a standard fashion   Anesthetic:  1% lidocaine w/ epinephrine 1-100,000 buffered w/ 8.4% NaHCO3 (18 cc) Instrument used: #15 blade   Hemostasis achieved with: pressure and electrodesiccation   Outcome: patient tolerated procedure well with no complications    Skin repair Complexity:  Complex Final length (cm):  10 Informed consent: discussed and consent obtained   Timeout: patient name, date of birth, surgical site, and procedure verified   Procedure prep:  Patient was prepped and draped in usual sterile fashion Prep type:  Chlorhexidine Anesthesia: the lesion was anesthetized in a standard fashion   Anesthetic:  1% lidocaine w/ epinephrine 1-100,000 local infiltration Reason for type of repair: reduce tension to allow closure, reduce the  risk of dehiscence, infection, and necrosis, allow closure of the large defect, allow side-to-side closure without requiring a flap or graft and enhance both functionality and cosmetic results   Undermining: area extensively undermined   Subcutaneous layers (deep stitches):  Suture size:  2-0 and 3-0 Suture type: Vicryl (polyglactin 910) and PDS (polydioxanone)   Fine/surface layer approximation (top stitches):  Suture size:  4-0 Suture type: nylon and Prolene (polypropylene)   Suture removal (days):  14 Hemostasis achieved with: suture, pressure and electrodesiccation Outcome: patient tolerated procedure well with no complications   Post-procedure details: wound care instructions given   Additional details:  Extensive undermining greater than the maximum width of the defect along at least one entire edge of the defect was performed Maximum width of defect perpendicular to line of the closure 3.8 cm Width of undermining done 4.3 cm  Mupirocin and a pressure dressing applied  Specimen 1 - Surgical pathology Differential Diagnosis: R/O residual MMis  Check Margins: No Healing bx site  [id number]  Suture tag at medial tip  Apply Mupirocin daily with bandage change   Encounter for Removal of Sutures - Incision site at the right forearm is clean, dry and intact - Wound cleansed, sutures removed, wound cleansed and steri strips applied.  - Discussed pathology results showing no residual MM  - Patient advised to keep steri-strips dry until they fall off. - Scars remodel for a full year. - Once steri-strips fall off, patient can apply over-the-counter silicone scar cream each night to help with scar remodeling if desired. -  Patient advised to call with any concerns or if they notice any new or changing lesions.   Return in about 1 week (around 08/13/2022) for wound check, suture removal in 2 weeks.  I, Emelia Salisbury, CMA, am acting as scribe for Forest Gleason, MD.  Documentation:  I have reviewed the above documentation for accuracy and completeness, and I agree with the above.  Forest Gleason, MD

## 2022-08-06 NOTE — Patient Instructions (Signed)
Wound Care Instructions for After Surgery  On the day following your surgery, you should begin doing daily dressing changes until your sutures are removed: Remove the bandage. Cleanse the wound gently with soap and water.  Make sure you then dry the skin surrounding the wound completely or the tape will not stick to the skin. Do not use cotton balls on the wound. After the wound is clean and dry, apply the ointment (either prescription antibiotic prescribed by your doctor or plain Vaseline if nothing was prescribed) gently with a Q-tip. If you are using a bandaid to cover: Apply a bandaid large enough to cover the entire wound. If you do not have a bandaid large enough to cover the wound OR if you are sensitive to bandaid adhesive: Cut a non-stick pad (such as Telfa) to fit the size of the wound.  Cover the wound with the non-stick pad. If the wound is draining, you may want to add a small amount of gauze on top of the non-stick pad for a little added compression to the area. Use tape to seal the area completely.  For the next 1-2 weeks: Be sure to keep the wound moist with ointment 24/7 to ensure best healing. If you are unable to cover the wound with a bandage to hold the ointment in place, you may need to reapply the ointment several times a day. Do not bend over or lift heavy items to reduce the chance of elevated blood pressure to the wound. Do not participate in particularly strenuous activities.  Below is a list of dressing supplies you might need.  Cotton-tipped applicators - Q-tips Gauze pads (2x2 and/or 4x4) - All-Purpose Sponges New and clean tube of petroleum jelly (Vaseline) OR prescription antibiotic ointment if prescribed Either a bandaid large enough to cover the entire wound OR non-stick dressing material (Telfa) and Tape (Paper or Hypafix)  FOR ADULT SURGERY PATIENTS: If you need something for pain relief, you may take 1 extra strength Tylenol (acetaminophen) and 2  ibuprofen (200 mg) together every 4 hours as needed. (Do not take these medications if you are allergic to them or if you know you cannot take them for any other reason). Typically you may only need pain medication for 1-3 days.   Comments on the Post-Operative Period Slight swelling and redness often appear around the wound. This is normal and will disappear within several days following the surgery. The healing wound will drain a brownish-red-yellow discharge during healing. This is a normal phase of wound healing. As the wound begins to heal, the drainage may increase in amount. Again, this drainage is normal. Notify us if the drainage becomes persistently bloody, excessively swollen, or intensely painful or develops a foul odor or red streaks.  The healing wound will also typically be itchy. This is normal. If you have severe or persistent pain, Notify us if the discomfort is severe or persistent. Avoid alcoholic beverages when taking pain medicine.  In Case of Wound Hemorrhage A wound hemorrhage is when the bandage suddenly becomes soaked with bright red blood and flows profusely. If this happens, sit down or lie down with your head elevated. If the wound has a dressing on it, do not remove the dressing. Apply pressure to the existing gauze. If the wound is not covered, use a gauze pad to apply pressure and continue applying the pressure for 20 minutes without peeking. DO NOT COVER THE WOUND WITH A LARGE TOWEL OR WASH CLOTH. Release your hand from the   wound site but do not remove the dressing. If the bleeding has stopped, gently clean around the wound. Leave the dressing in place for 24 hours if possible. This wait time allows the blood vessels to close off so that you do not spark a new round of bleeding by disrupting the newly clotted blood vessels with an immediate dressing change. If the bleeding does not subside, continue to hold pressure for 40 minutes. If bleeding continues, page your  physician, contact an After Hours clinic or go to the Emergency Room.  Due to recent changes in healthcare laws, you may see results of your pathology and/or laboratory studies on MyChart before the doctors have had a chance to review them. We understand that in some cases there may be results that are confusing or concerning to you. Please understand that not all results are received at the same time and often the doctors may need to interpret multiple results in order to provide you with the best plan of care or course of treatment. Therefore, we ask that you please give us 2 business days to thoroughly review all your results before contacting the office for clarification. Should we see a critical lab result, you will be contacted sooner.   If You Need Anything After Your Visit  If you have any questions or concerns for your doctor, please call our main line at [phone] and press option 4 to reach your doctor's medical assistant. If no one answers, please leave a voicemail as directed and we will return your call as soon as possible. Messages left after 4 pm will be answered the following business day.   You may also send us a message via MyChart. We typically respond to MyChart messages within 1-2 business days.  For prescription refills, please ask your pharmacy to contact our office. Our fax number is [phone].  If you have an urgent issue when the clinic is closed that cannot wait until the next business day, you can page your doctor at the number below.    Please note that while we do our best to be available for urgent issues outside of office hours, we are not available 24/7.   If you have an urgent issue and are unable to reach us, you may choose to seek medical care at your doctor's office, retail clinic, urgent care center, or emergency room.  If you have a medical emergency, please immediately call 911 or go to the emergency department.  Pager Numbers  - Dr. Kowalski:  [phone]  - Dr. Moye: [phone]  - Dr. Stewart: [phone]  In the event of inclement weather, please call our main line at [phone] for an update on the status of any delays or closures.  Dermatology Medication Tips: Please keep the boxes that topical medications come in in order to help keep track of the instructions about where and how to use these. Pharmacies typically print the medication instructions only on the boxes and not directly on the medication tubes.   If your medication is too expensive, please contact our office at [phone] option 4 or send us a message through MyChart.   We are unable to tell what your co-pay for medications will be in advance as this is different depending on your insurance coverage. However, we may be able to find a substitute medication at lower cost or fill out paperwork to get insurance to cover a needed medication.   If a prior authorization is required to get your medication covered by   your insurance company, please allow us 1-2 business days to complete this process.  Drug prices often vary depending on where the prescription is filled and some pharmacies may offer cheaper prices.  The website www.goodrx.com contains coupons for medications through different pharmacies. The prices here do not account for what the cost may be with help from insurance (it may be cheaper with your insurance), but the website can give you the price if you did not use any insurance.  - You can print the associated coupon and take it with your prescription to the pharmacy.  - You may also stop by our office during regular business hours and pick up a GoodRx coupon card.  - If you need your prescription sent electronically to a different pharmacy, notify our office through [facility] MyChart or by phone at [phone] option 4.     Si Usted Necesita Algo Despus de Su Visita  Tambin puede enviarnos un mensaje a travs de MyChart. Por lo general  respondemos a los mensajes de MyChart en el transcurso de 1 a 2 das hbiles.  Para renovar recetas, por favor pida a su farmacia que se ponga en contacto con nuestra oficina. Nuestro nmero de fax es el [phone].  Si tiene un asunto urgente cuando la clnica est cerrada y que no puede esperar hasta el siguiente da hbil, puede llamar/localizar a su doctor(a) al nmero que aparece a continuacin.   Por favor, tenga en cuenta que aunque hacemos todo lo posible para estar disponibles para asuntos urgentes fuera del horario de oficina, no estamos disponibles las 24 horas del da, los 7 das de la semana.   Si tiene un problema urgente y no puede comunicarse con nosotros, puede optar por buscar atencin mdica  en el consultorio de su doctor(a), en una clnica privada, en un centro de atencin urgente o en una sala de emergencias.  Si tiene una emergencia mdica, por favor llame inmediatamente al 911 o vaya a la sala de emergencias.  Nmeros de bper  - Dr. Kowalski: [phone]  - Dra. Moye: [phone]  - Dra. Stewart: [phone]  En caso de inclemencias del tiempo, por favor llame a nuestra lnea principal al [phone] para una actualizacin sobre el estado de cualquier retraso o cierre.  Consejos para la medicacin en dermatologa: Por favor, guarde las cajas en las que vienen los medicamentos de uso tpico para ayudarle a seguir las instrucciones sobre dnde y cmo usarlos. Las farmacias generalmente imprimen las instrucciones del medicamento slo en las cajas y no directamente en los tubos del medicamento.   Si su medicamento es muy caro, por favor, pngase en contacto con nuestra oficina llamando al [phone] y presione la opcin 4 o envenos un mensaje a travs de MyChart.   No podemos decirle cul ser su copago por los medicamentos por adelantado ya que esto es diferente dependiendo de la cobertura de su seguro. Sin embargo, es posible que podamos encontrar un  medicamento sustituto a menor costo o llenar un formulario para que el seguro cubra el medicamento que se considera necesario.   Si se requiere una autorizacin previa para que su compaa de seguros cubra su medicamento, por favor permtanos de 1 a 2 das hbiles para completar este proceso.  Los precios de los medicamentos varan con frecuencia dependiendo del lugar de dnde se surte la receta y alguna farmacias pueden ofrecer precios ms baratos.  El sitio web www.goodrx.com tiene cupones para medicamentos de diferentes farmacias. Los precios aqu no tienen   en cuenta lo que podra costar con la ayuda del seguro (puede ser ms barato con su seguro), pero el sitio web puede darle el precio si no utiliz ningn seguro.  - Puede imprimir el cupn correspondiente y llevarlo con su receta a la farmacia.  - Tambin puede pasar por nuestra oficina durante el horario de atencin regular y recoger una tarjeta de cupones de GoodRx.  - Si necesita que su receta se enve electrnicamente a una farmacia diferente, informe a nuestra oficina a travs de MyChart de [facility] o por telfono llamando al [phone] y presione la opcin 4.  

## 2022-08-08 ENCOUNTER — Other Ambulatory Visit: Payer: Self-pay

## 2022-08-08 MED ORDER — MUPIROCIN 2 % EX OINT: TOPICAL_OINTMENT | CUTANEOUS | 0 refills | Status: DC

## 2022-08-08 NOTE — Progress Notes (Signed)
Patient left nurse VM needing RF of Mupirocin. RX sent in and left VM advising patient this was done. aw

## 2022-08-09 DIAGNOSIS — E785 Hyperlipidemia, unspecified: Secondary | ICD-10-CM | POA: Diagnosis not present

## 2022-08-09 DIAGNOSIS — L84 Corns and callosities: Secondary | ICD-10-CM | POA: Diagnosis not present

## 2022-08-09 DIAGNOSIS — C4361 Malignant melanoma of right upper limb, including shoulder: Secondary | ICD-10-CM | POA: Diagnosis not present

## 2022-08-09 DIAGNOSIS — E1140 Type 2 diabetes mellitus with diabetic neuropathy, unspecified: Secondary | ICD-10-CM | POA: Diagnosis not present

## 2022-08-09 DIAGNOSIS — E119 Type 2 diabetes mellitus without complications: Secondary | ICD-10-CM | POA: Diagnosis not present

## 2022-08-09 DIAGNOSIS — I10 Essential (primary) hypertension: Secondary | ICD-10-CM | POA: Diagnosis not present

## 2022-08-13 ENCOUNTER — Telehealth: Payer: Self-pay

## 2022-08-13 ENCOUNTER — Ambulatory Visit: Payer: Medicare Other | Admitting: Dermatology

## 2022-08-13 DIAGNOSIS — T148XXA Other injury of unspecified body region, initial encounter: Secondary | ICD-10-CM

## 2022-08-13 DIAGNOSIS — Z86006 Personal history of melanoma in-situ: Secondary | ICD-10-CM

## 2022-08-13 DIAGNOSIS — S51801A Unspecified open wound of right forearm, initial encounter: Secondary | ICD-10-CM

## 2022-08-13 NOTE — Progress Notes (Signed)
   Follow-Up Visit   Subjective  Aaron Valdez is a 76 y.o. male who presents for the following: Follow-up (Patient here today for wound recheck at right forearm s/p excision 07/24/22 for bx proven MM.).  The following portions of the chart were reviewed this encounter and updated as appropriate:   Tobacco  Allergies  Meds  Problems  Med Hx  Surg Hx  Fam Hx      Review of Systems:  No other skin or systemic complaints except as noted in HPI or Assessment and Plan.  Objective  Well appearing patient in no apparent distress; mood and affect are within normal limits.  A focused examination was performed including right arm, left leg. Relevant physical exam findings are noted in the Assessment and Plan.  Right Forearm See photo     right calf See photo       Assessment & Plan  Open wound Right Forearm  S/p melanoma excision. Central site left for secondary intention healing doing well.  Continue wound care, recheck at follow up  History of melanoma in situ right calf  S/p excision  Continue wound care, remove sutures next week at follow up.    Return for Suture Removal, as scheduled.  Graciella Belton, RMA, am acting as scribe for Forest Gleason, MD .  Documentation: I have reviewed the above documentation for accuracy and completeness, and I agree with the above.  Forest Gleason, MD

## 2022-08-13 NOTE — Telephone Encounter (Signed)
Discussed at wound check appointment.

## 2022-08-13 NOTE — Patient Instructions (Signed)
Due to recent changes in healthcare laws, you may see results of your pathology and/or laboratory studies on MyChart before the doctors have had a chance to review them. We understand that in some cases there may be results that are confusing or concerning to you. Please understand that not all results are received at the same time and often the doctors may need to interpret multiple results in order to provide you with the best plan of care or course of treatment. Therefore, we ask that you please give us 2 business days to thoroughly review all your results before contacting the office for clarification. Should we see a critical lab result, you will be contacted sooner.   If You Need Anything After Your Visit  If you have any questions or concerns for your doctor, please call our main line at [phone] and press option 4 to reach your doctor's medical assistant. If no one answers, please leave a voicemail as directed and we will return your call as soon as possible. Messages left after 4 pm will be answered the following business day.   You may also send us a message via MyChart. We typically respond to MyChart messages within 1-2 business days.  For prescription refills, please ask your pharmacy to contact our office. Our fax number is [phone].  If you have an urgent issue when the clinic is closed that cannot wait until the next business day, you can page your doctor at the number below.    Please note that while we do our best to be available for urgent issues outside of office hours, we are not available 24/7.   If you have an urgent issue and are unable to reach us, you may choose to seek medical care at your doctor's office, retail clinic, urgent care center, or emergency room.  If you have a medical emergency, please immediately call 911 or go to the emergency department.  Pager Numbers  - Dr. Kowalski: [phone]  - Dr. Moye: [phone]  - Dr. Stewart:  [phone]  In the event of inclement weather, please call our main line at [phone] for an update on the status of any delays or closures.  Dermatology Medication Tips: Please keep the boxes that topical medications come in in order to help keep track of the instructions about where and how to use these. Pharmacies typically print the medication instructions only on the boxes and not directly on the medication tubes.   If your medication is too expensive, please contact our office at [phone] option 4 or send us a message through MyChart.   We are unable to tell what your co-pay for medications will be in advance as this is different depending on your insurance coverage. However, we may be able to find a substitute medication at lower cost or fill out paperwork to get insurance to cover a needed medication.   If a prior authorization is required to get your medication covered by your insurance company, please allow us 1-2 business days to complete this process.  Drug prices often vary depending on where the prescription is filled and some pharmacies may offer cheaper prices.  The website www.goodrx.com contains coupons for medications through different pharmacies. The prices here do not account for what the cost may be with help from insurance (it may be cheaper with your insurance), but the website can give you the price if you did not use any insurance.  - You can print the associated coupon and take it with   your prescription to the pharmacy.  - You may also stop by our office during regular business hours and pick up a GoodRx coupon card.  - If you need your prescription sent electronically to a different pharmacy, notify our office through [facility] MyChart or by phone at [phone] option 4.     Si Usted Necesita Algo Despus de Su Visita  Tambin puede enviarnos un mensaje a travs de MyChart. Por lo general respondemos a los mensajes de MyChart en el transcurso de 1 a 2  das hbiles.  Para renovar recetas, por favor pida a su farmacia que se ponga en contacto con nuestra oficina. Nuestro nmero de fax es el [phone].  Si tiene un asunto urgente cuando la clnica est cerrada y que no puede esperar hasta el siguiente da hbil, puede llamar/localizar a su doctor(a) al nmero que aparece a continuacin.   Por favor, tenga en cuenta que aunque hacemos todo lo posible para estar disponibles para asuntos urgentes fuera del horario de oficina, no estamos disponibles las 24 horas del da, los 7 das de la semana.   Si tiene un problema urgente y no puede comunicarse con nosotros, puede optar por buscar atencin mdica  en el consultorio de su doctor(a), en una clnica privada, en un centro de atencin urgente o en una sala de emergencias.  Si tiene una emergencia mdica, por favor llame inmediatamente al 911 o vaya a la sala de emergencias.  Nmeros de bper  - Dr. Kowalski: [phone]  - Dra. Moye: [phone]  - Dra. Stewart: [phone]  En caso de inclemencias del tiempo, por favor llame a nuestra lnea principal al [phone] para una actualizacin sobre el estado de cualquier retraso o cierre.  Consejos para la medicacin en dermatologa: Por favor, guarde las cajas en las que vienen los medicamentos de uso tpico para ayudarle a seguir las instrucciones sobre dnde y cmo usarlos. Las farmacias generalmente imprimen las instrucciones del medicamento slo en las cajas y no directamente en los tubos del medicamento.   Si su medicamento es muy caro, por favor, pngase en contacto con nuestra oficina llamando al [phone] y presione la opcin 4 o envenos un mensaje a travs de MyChart.   No podemos decirle cul ser su copago por los medicamentos por adelantado ya que esto es diferente dependiendo de la cobertura de su seguro. Sin embargo, es posible que podamos encontrar un medicamento sustituto a menor costo o llenar un formulario para que el  seguro cubra el medicamento que se considera necesario.   Si se requiere una autorizacin previa para que su compaa de seguros cubra su medicamento, por favor permtanos de 1 a 2 das hbiles para completar este proceso.  Los precios de los medicamentos varan con frecuencia dependiendo del lugar de dnde se surte la receta y alguna farmacias pueden ofrecer precios ms baratos.  El sitio web www.goodrx.com tiene cupones para medicamentos de diferentes farmacias. Los precios aqu no tienen en cuenta lo que podra costar con la ayuda del seguro (puede ser ms barato con su seguro), pero el sitio web puede darle el precio si no utiliz ningn seguro.  - Puede imprimir el cupn correspondiente y llevarlo con su receta a la farmacia.  - Tambin puede pasar por nuestra oficina durante el horario de atencin regular y recoger una tarjeta de cupones de GoodRx.  - Si necesita que su receta se enve electrnicamente a una farmacia diferente, informe a nuestra oficina a travs de MyChart de [facility]   o por telfono llamando al [phone] y presione la opcin 4.  

## 2022-08-13 NOTE — Telephone Encounter (Signed)
-----   Message from Alfonso Patten, MD sent at 08/13/2022 10:36 AM EST ----- Skin (M), right calf EXCISION, NO RESIDUAL MELANOMA IN SITU, MARGINS FREE OF MALIGNANCY  Entire lesion appears to be out. No additional treatment needed at this time. Please call our office [phone] with any questions.   MAs please call. Thank you!

## 2022-08-14 ENCOUNTER — Encounter: Payer: Medicare Other | Admitting: Dermatology

## 2022-08-20 ENCOUNTER — Encounter: Payer: Self-pay | Admitting: Dermatology

## 2022-08-20 ENCOUNTER — Ambulatory Visit (INDEPENDENT_AMBULATORY_CARE_PROVIDER_SITE_OTHER): Payer: Medicare Other | Admitting: Dermatology

## 2022-08-20 DIAGNOSIS — Z86006 Personal history of melanoma in-situ: Secondary | ICD-10-CM

## 2022-08-20 DIAGNOSIS — S51801A Unspecified open wound of right forearm, initial encounter: Secondary | ICD-10-CM

## 2022-08-20 DIAGNOSIS — T148XXA Other injury of unspecified body region, initial encounter: Secondary | ICD-10-CM

## 2022-08-20 NOTE — Patient Instructions (Signed)
Due to recent changes in healthcare laws, you may see results of your pathology and/or laboratory studies on MyChart before the doctors have had a chance to review them. We understand that in some cases there may be results that are confusing or concerning to you. Please understand that not all results are received at the same time and often the doctors may need to interpret multiple results in order to provide you with the best plan of care or course of treatment. Therefore, we ask that you please give us 2 business days to thoroughly review all your results before contacting the office for clarification. Should we see a critical lab result, you will be contacted sooner.   If You Need Anything After Your Visit  If you have any questions or concerns for your doctor, please call our main line at [phone] and press option 4 to reach your doctor's medical assistant. If no one answers, please leave a voicemail as directed and we will return your call as soon as possible. Messages left after 4 pm will be answered the following business day.   You may also send us a message via MyChart. We typically respond to MyChart messages within 1-2 business days.  For prescription refills, please ask your pharmacy to contact our office. Our fax number is [phone].  If you have an urgent issue when the clinic is closed that cannot wait until the next business day, you can page your doctor at the number below.    Please note that while we do our best to be available for urgent issues outside of office hours, we are not available 24/7.   If you have an urgent issue and are unable to reach us, you may choose to seek medical care at your doctor's office, retail clinic, urgent care center, or emergency room.  If you have a medical emergency, please immediately call 911 or go to the emergency department.  Pager Numbers  - Dr. Kowalski: [phone]  - Dr. Moye: [phone]  - Dr. Stewart:  [phone]  In the event of inclement weather, please call our main line at [phone] for an update on the status of any delays or closures.  Dermatology Medication Tips: Please keep the boxes that topical medications come in in order to help keep track of the instructions about where and how to use these. Pharmacies typically print the medication instructions only on the boxes and not directly on the medication tubes.   If your medication is too expensive, please contact our office at [phone] option 4 or send us a message through MyChart.   We are unable to tell what your co-pay for medications will be in advance as this is different depending on your insurance coverage. However, we may be able to find a substitute medication at lower cost or fill out paperwork to get insurance to cover a needed medication.   If a prior authorization is required to get your medication covered by your insurance company, please allow us 1-2 business days to complete this process.  Drug prices often vary depending on where the prescription is filled and some pharmacies may offer cheaper prices.  The website www.goodrx.com contains coupons for medications through different pharmacies. The prices here do not account for what the cost may be with help from insurance (it may be cheaper with your insurance), but the website can give you the price if you did not use any insurance.  - You can print the associated coupon and take it with   your prescription to the pharmacy.  - You may also stop by our office during regular business hours and pick up a GoodRx coupon card.  - If you need your prescription sent electronically to a different pharmacy, notify our office through [place] MyChart or by phone at [phone] option 4.     Si Usted Necesita Algo Despus de Su Visita  Tambin puede enviarnos un mensaje a travs de MyChart. Por lo general respondemos a los mensajes de MyChart en el transcurso de 1 a 2  das hbiles.  Para renovar recetas, por favor pida a su farmacia que se ponga en contacto con nuestra oficina. Nuestro nmero de fax es el [phone].  Si tiene un asunto urgente cuando la clnica est cerrada y que no puede esperar hasta el siguiente da hbil, puede llamar/localizar a su doctor(a) al nmero que aparece a continuacin.   Por favor, tenga en cuenta que aunque hacemos todo lo posible para estar disponibles para asuntos urgentes fuera del horario de oficina, no estamos disponibles las 24 horas del da, los 7 das de la semana.   Si tiene un problema urgente y no puede comunicarse con nosotros, puede optar por buscar atencin mdica  en el consultorio de su doctor(a), en una clnica privada, en un centro de atencin urgente o en una sala de emergencias.  Si tiene una emergencia mdica, por favor llame inmediatamente al 911 o vaya a la sala de emergencias.  Nmeros de bper  - Dr. Kowalski: [phone]  - Dra. Moye: [phone]  - Dra. Stewart: [phone]  En caso de inclemencias del tiempo, por favor llame a nuestra lnea principal al [phone] para una actualizacin sobre el estado de cualquier retraso o cierre.  Consejos para la medicacin en dermatologa: Por favor, guarde las cajas en las que vienen los medicamentos de uso tpico para ayudarle a seguir las instrucciones sobre dnde y cmo usarlos. Las farmacias generalmente imprimen las instrucciones del medicamento slo en las cajas y no directamente en los tubos del medicamento.   Si su medicamento es muy caro, por favor, pngase en contacto con nuestra oficina llamando al [phone] y presione la opcin 4 o envenos un mensaje a travs de MyChart.   No podemos decirle cul ser su copago por los medicamentos por adelantado ya que esto es diferente dependiendo de la cobertura de su seguro. Sin embargo, es posible que podamos encontrar un medicamento sustituto a menor costo o llenar un formulario para que el  seguro cubra el medicamento que se considera necesario.   Si se requiere una autorizacin previa para que su compaa de seguros cubra su medicamento, por favor permtanos de 1 a 2 das hbiles para completar este proceso.  Los precios de los medicamentos varan con frecuencia dependiendo del lugar de dnde se surte la receta y alguna farmacias pueden ofrecer precios ms baratos.  El sitio web www.goodrx.com tiene cupones para medicamentos de diferentes farmacias. Los precios aqu no tienen en cuenta lo que podra costar con la ayuda del seguro (puede ser ms barato con su seguro), pero el sitio web puede darle el precio si no utiliz ningn seguro.  - Puede imprimir el cupn correspondiente y llevarlo con su receta a la farmacia.  - Tambin puede pasar por nuestra oficina durante el horario de atencin regular y recoger una tarjeta de cupones de GoodRx.  - Si necesita que su receta se enve electrnicamente a una farmacia diferente, informe a nuestra oficina a travs de MyChart de [place]   o por telfono llamando al [phone] y presione la opcin 4.  

## 2022-08-20 NOTE — Progress Notes (Unsigned)
   Follow-Up Visit   Subjective  Aaron Valdez is a 76 y.o. 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   The following portions of the chart were reviewed this encounter and updated as appropriate:   Tobacco  Allergies  Meds  Problems  Med Hx  Surg Hx  Fam Hx     Review of Systems:  No other skin or systemic complaints except as noted in HPI or Assessment and Plan.  Objective  Well appearing patient in no apparent distress; mood and affect are within normal limits.  A focused examination was performed including right calf, right forearm . Relevant physical exam findings are noted in the Assessment and Plan.  right calf Well healed scar   right forearm Healing linear surgical wound.     Assessment & Plan  History of melanoma in situ right calf  Encounter for Removal of Sutures - Incision site at the right calf is clean, dry and intact - Wound cleansed, sutures removed, wound cleansed and steri strips applied.  - Discussed pathology results showing NO RESIDUAL MELANOMA IN SITU, MARGINS FREE OF MALIGNANCY   - Patient advised to keep steri-strips dry until they fall off. - Scars remodel for a full year. - Once steri-strips fall off, patient can apply over-the-counter silicone scar cream each night to help with scar remodeling if desired. - Patient advised to call with any concerns or if they notice any new or changing lesions.   Open wound s/p Melanoma excision right forearm  S/p melanoma excision.  Central site left for secondary intention healing doing well. Debrided with Puracyn wash, patted dry, applied Mupirocin ointment and Coban wrap     Return in about 2 months (around 10/20/2022) for TBSE, hx of Melanoma .  I, Marye Round, CMA, am acting as scribe for Forest Gleason, MD .    Documentation: I have reviewed the above documentation for accuracy and completeness, and I agree with the above.  Forest Gleason, MD

## 2022-08-21 ENCOUNTER — Encounter: Payer: Self-pay | Admitting: Dermatology

## 2022-10-31 ENCOUNTER — Ambulatory Visit (INDEPENDENT_AMBULATORY_CARE_PROVIDER_SITE_OTHER): Payer: Medicare Other | Admitting: Dermatology

## 2022-10-31 ENCOUNTER — Encounter: Payer: Self-pay | Admitting: Dermatology

## 2022-10-31 VITALS — BP 134/80 | HR 85

## 2022-10-31 DIAGNOSIS — Z1283 Encounter for screening for malignant neoplasm of skin: Secondary | ICD-10-CM

## 2022-10-31 DIAGNOSIS — L820 Inflamed seborrheic keratosis: Secondary | ICD-10-CM | POA: Diagnosis not present

## 2022-10-31 DIAGNOSIS — D225 Melanocytic nevi of trunk: Secondary | ICD-10-CM | POA: Diagnosis not present

## 2022-10-31 DIAGNOSIS — T8130XA Disruption of wound, unspecified, initial encounter: Secondary | ICD-10-CM | POA: Diagnosis not present

## 2022-10-31 DIAGNOSIS — Z86006 Personal history of melanoma in-situ: Secondary | ICD-10-CM | POA: Diagnosis not present

## 2022-10-31 DIAGNOSIS — Z85820 Personal history of malignant melanoma of skin: Secondary | ICD-10-CM | POA: Diagnosis not present

## 2022-10-31 DIAGNOSIS — D492 Neoplasm of unspecified behavior of bone, soft tissue, and skin: Secondary | ICD-10-CM

## 2022-10-31 DIAGNOSIS — L814 Other melanin hyperpigmentation: Secondary | ICD-10-CM

## 2022-10-31 DIAGNOSIS — D2262 Melanocytic nevi of left upper limb, including shoulder: Secondary | ICD-10-CM

## 2022-10-31 DIAGNOSIS — L905 Scar conditions and fibrosis of skin: Secondary | ICD-10-CM | POA: Diagnosis not present

## 2022-10-31 DIAGNOSIS — L821 Other seborrheic keratosis: Secondary | ICD-10-CM

## 2022-10-31 DIAGNOSIS — L578 Other skin changes due to chronic exposure to nonionizing radiation: Secondary | ICD-10-CM

## 2022-10-31 DIAGNOSIS — D239 Other benign neoplasm of skin, unspecified: Secondary | ICD-10-CM

## 2022-10-31 HISTORY — DX: Other benign neoplasm of skin, unspecified: D23.9

## 2022-10-31 NOTE — Patient Instructions (Addendum)
Wound Care Instructions  Cleanse wound gently with soap and water once a day then pat dry with clean gauze. Apply a thin coat of Petrolatum (petroleum jelly, "Vaseline") over the wound (unless you have an allergy to this). We recommend that you use a new, sterile tube of Vaseline. Do not pick or remove scabs. Do not remove the yellow or white "healing tissue" from the base of the wound.  Cover the wound with fresh, clean, nonstick gauze and secure with paper tape. You may use Band-Aids in place of gauze and tape if the wound is small enough, but would recommend trimming much of the tape off as there is often too much. Sometimes Band-Aids can irritate the skin.  You should call the office for your biopsy report after 1 week if you have not already been contacted.  If you experience any problems, such as abnormal amounts of bleeding, swelling, significant bruising, significant pain, or evidence of infection, please call the office immediately.  FOR ADULT SURGERY PATIENTS: If you need something for pain relief you may take 1 extra strength Tylenol (acetaminophen) AND 2 Ibuprofen ('200mg'$  each) together every 4 hours as needed for pain. (do not take these if you are allergic to them or if you have a reason you should not take them.) Typically, you may only need pain medication for 1 to 3 days.      Recommend daily broad spectrum sunscreen SPF 30+ to sun-exposed areas, reapply every 2 hours as needed. Call for new or changing lesions.  Staying in the shade or wearing long sleeves, sun glasses (UVA+UVB protection) and wide brim hats (4-inch brim around the entire circumference of the hat) are also recommended for sun protection.    Recommend taking Heliocare sun protection supplement daily in sunny weather for additional sun protection. For maximum protection on the sunniest days, you can take up to 2 capsules of regular Heliocare OR take 1 capsule of Heliocare Ultra. For prolonged exposure (such as a  full day in the sun), you can repeat your dose of the supplement 4 hours after your first dose. Heliocare can be purchased at [employer], at some [facility] or at [email or web address].     Melanoma ABCDEs  Melanoma is the most dangerous type of skin cancer, and is the leading cause of death from skin disease.  You are more likely to develop melanoma if you: Have light-colored skin, light-colored eyes, or red or blond hair Spend a lot of time in the sun Tan regularly, either outdoors or in a tanning bed Have had blistering sunburns, especially during childhood Have a close family member who has had a melanoma Have atypical moles or large birthmarks  Early detection of melanoma is key since treatment is typically straightforward and cure rates are extremely high if we catch it early.   The first sign of melanoma is often a change in a mole or a new dark spot.  The ABCDE system is a way of remembering the signs of melanoma.  A for asymmetry:  The two halves do not match. B for border:  The edges of the growth are irregular. C for color:  A mixture of colors are present instead of an even brown color. D for diameter:  Melanomas are usually (but not always) greater than 4m - the size of a pencil eraser. E for evolution:  The spot keeps changing in size, shape, and color.  Please check your skin once per month between visits. You can use a  small mirror in front and a large mirror behind you to keep an eye on the back side or your body.   If you see any new or changing lesions before your next follow-up, please call to schedule a visit.  Please continue daily skin protection including broad spectrum sunscreen SPF 30+ to sun-exposed areas, reapplying every 2 hours as needed when you're outdoors.   Staying in the shade or wearing long sleeves, sun glasses (UVA+UVB protection) and wide brim hats (4-inch brim around the entire circumference of the hat) are also recommended for sun  protection.     Due to recent changes in healthcare laws, you may see results of your pathology and/or laboratory studies on MyChart before the doctors have had a chance to review them. We understand that in some cases there may be results that are confusing or concerning to you. Please understand that not all results are received at the same time and often the doctors may need to interpret multiple results in order to provide you with the best plan of care or course of treatment. Therefore, we ask that you please give [place] 2 business days to thoroughly review all your results before contacting the office for clarification. Should we see a critical lab result, you will be contacted sooner.   If You Need Anything After Your Visit  If you have any questions or concerns for your doctor, please call our main line at [phone] and press option 4 to reach your doctor's medical assistant. If no one answers, please leave a voicemail as directed and we will return your call as soon as possible. Messages left after 4 pm will be answered the following business day.   You may also send [place] a message via [place]. We typically respond to MyChart messages within 1-2 business days.  For prescription refills, please ask your pharmacy to contact our office. Our fax number is [phone].  If you have an urgent issue when the clinic is closed that cannot wait until the next business day, you can page your doctor at the number below.    Please note that while we do our best to be available for urgent issues outside of office hours, we are not available 24/7.   If you have an urgent issue and are unable to reach [place], you may choose to seek medical care at your doctor's office, retail clinic, urgent care center, or emergency room.  If you have a medical emergency, please immediately call 911 or go to the emergency department.  Pager Numbers  - Dr. Nehemiah Massed: [phone]  - Dr. Laurence Ferrari: [phone]  - Dr.  Nicole Kindred: [phone]  In the event of inclement weather, please call our main line at [phone] for an update on the status of any delays or closures.  Dermatology Medication Tips: Please keep the boxes that topical medications come in in order to help keep track of the instructions about where and how to use these. Pharmacies typically print the medication instructions only on the boxes and not directly on the medication tubes.   If your medication is too expensive, please contact our office at [phone] option 4 or send [place] a message through [place].   We are unable to tell what your co-pay for medications will be in advance as this is different depending on your insurance coverage. However, we may be able to find a substitute medication at lower cost or fill out paperwork to get insurance to cover a needed medication.   If a  prior authorization is required to get your medication covered by your insurance company, please allow [place] 1-2 business days to complete this process.  Drug prices often vary depending on where the prescription is filled and some pharmacies may offer cheaper prices.  The website www.goodrx.com contains coupons for medications through different pharmacies. The prices here do not account for what the cost may be with help from insurance (it may be cheaper with your insurance), but the website can give you the price if you did not use any insurance.  - You can print the associated coupon and take it with your prescription to the pharmacy.  - You may also stop by our office during regular business hours and pick up a GoodRx coupon card.  - If you need your prescription sent electronically to a different pharmacy, notify our office through [facility] or by phone at [phone] option 4.     Si Usted Necesita Algo Despus de Su Visita  Tambin puede enviarnos un mensaje a travs de Pharmacist, community. Por lo general respondemos a los mensajes de MyChart en el transcurso  de 1 a 2 das hbiles.  Para renovar recetas, por favor pida a su farmacia que se ponga en contacto con nuestra oficina. Harland Dingwall de fax es [place] [phone].  Si tiene un asunto urgente cuando la clnica est cerrada y que no puede esperar hasta el siguiente da hbil, puede llamar/localizar a su doctor(a) al nmero que aparece a continuacin.   Por favor, tenga en cuenta que aunque hacemos todo lo posible para estar disponibles para asuntos urgentes fuera del horario de [place], no estamos disponibles las 24 horas del da, los 7 das de la [place].   Si tiene un problema urgente y no puede comunicarse con nosotros, puede optar por buscar atencin mdica  en el consultorio de su doctor(a), en una clnica privada, en un centro de atencin urgente o en una sala de emergencias.  Si tiene Engineering geologist, por favor llame inmediatamente al 911 o vaya a la sala de emergencias.  Nmeros de bper  - Dr. Nehemiah Massed: [phone]  - Dra. Moye: [phone]  - Dra. Nicole Kindred: [phone]  En caso de inclemencias del [place], por favor llame a Johnsie Kindred principal al [phone] para una actualizacin sobre el [place] de cualquier retraso o cierre.  Consejos para la medicacin en dermatologa: Por favor, guarde las cajas en las que vienen los medicamentos de uso tpico para ayudarle a seguir las instrucciones sobre dnde y cmo usarlos. Las farmacias generalmente imprimen las instrucciones del medicamento slo en las cajas y no directamente en los tubos del [place].   Si su medicamento es muy caro, por favor, pngase en contacto con Zigmund Daniel llamando al [phone] y presione la opcin 4 o envenos un mensaje a travs de Pharmacist, community.   No podemos decirle cul ser su copago por los medicamentos por adelantado ya que esto es diferente dependiendo de la cobertura de su seguro. Sin embargo, es posible que podamos encontrar un medicamento sustituto a Electrical engineer un formulario  para que el seguro cubra el medicamento que se considera necesario.   Si se requiere una autorizacin previa para que su compaa de seguros [place] su medicamento, por favor permtanos de 1 a 2 das hbiles para completar este proceso.  Los precios de los medicamentos varan con frecuencia dependiendo del Environmental consultant de dnde se surte la receta y alguna farmacias pueden ofrecer precios ms baratos.  El sitio web www.goodrx.com tiene cupones  para medicamentos de diferentes farmacias. Los precios aqu no tienen en cuenta lo que podra costar con la ayuda del seguro (puede ser ms barato con su seguro), pero el sitio web puede darle el precio si no utiliz ningn seguro.  - Puede imprimir el cupn correspondiente y llevarlo con su receta a la farmacia.  - Tambin puede pasar por nuestra oficina durante el horario de atencin regular y recoger una tarjeta de cupones de GoodRx.  - Si necesita que su receta se enve electrnicamente a una farmacia diferente, informe a nuestra oficina a travs de MyChart de [place] o por telfono llamando al [phone] y presione la opcin 4.  

## 2022-10-31 NOTE — Progress Notes (Signed)
Follow-Up Visit   Subjective  Aaron Valdez is a 77 y.o. male who presents for the following: Annual Exam (HxMM, HxMIS).  The patient presents for Total-Body Skin Exam (TBSE) for skin cancer screening and mole check.  The patient has spots, moles and lesions to be evaluated, some may be new or changing and the patient has concerns that these could be cancer.  The following portions of the chart were reviewed this encounter and updated as appropriate:  Tobacco  Allergies  Meds  Problems  Med Hx  Surg Hx  Fam Hx      Review of Systems: No other skin or systemic complaints except as noted in HPI or Assessment and Plan.   Objective  Well appearing patient in no apparent distress; mood and affect are within normal limits.  A full examination was performed including scalp, head, eyes, ears, nose, lips, neck, chest, axillae, abdomen, back, buttocks, bilateral upper extremities, bilateral lower extremities, hands, feet, fingers, toes, fingernails, and toenails. All findings within normal limits unless otherwise noted below.  Left Lower Back 0.7 cm pink papule       Left Upper Arm 0.3 cm light brown to dark brown macule     Right Flank 0.5 cm medium-dark brown macule       right calf Visible suture   Assessment & Plan   History of Melanoma. Right forearm. 06/2022 - No evidence of recurrence today - No lymphadenopathy - Recommend regular full body skin exams - Recommend daily broad spectrum sunscreen SPF 30+ to sun-exposed areas, reapply every 2 hours as needed.  - Call if any new or changing lesions are noted between office visits   History of Melanoma in Situ. Right calf. 06/2022 - No evidence of recurrence today - No lymphadenopathy - Recommend regular full body skin exams - Recommend daily broad spectrum sunscreen SPF 30+ to sun-exposed areas, reapply every 2 hours as needed.  - Call if any new or changing lesions are noted between office visits    Lentigines - Scattered tan macules - Due to sun exposure - Benign-appearing, observe - Recommend daily broad spectrum sunscreen SPF 30+ to sun-exposed areas, reapply every 2 hours as needed. - Call for any changes  Seborrheic Keratoses - Stuck-on, waxy, tan-brown papules and/or plaques  - Benign-appearing - Discussed benign etiology and prognosis. - Observe - Call for any changes  Melanocytic Nevi - Tan-brown and/or pink-flesh-colored symmetric macules and papules - Benign appearing on exam today - Observation - Call clinic for new or changing moles - Recommend daily use of broad spectrum spf 30+ sunscreen to sun-exposed areas.   Hemangiomas - Red papules - Discussed benign nature - Observe - Call for any changes  Actinic Damage - Chronic condition, secondary to cumulative UV/sun exposure - diffuse scaly erythematous macules with underlying dyspigmentation - Recommend daily broad spectrum sunscreen SPF 30+ to sun-exposed areas, reapply every 2 hours as needed.  - Staying in the shade or wearing long sleeves, sun glasses (UVA+UVB protection) and wide brim hats (4-inch brim around the entire circumference of the hat) are also recommended for sun protection.  - Call for new or changing lesions.  Skin cancer screening performed today.  Neoplasm of skin (3) Left Lower Back  Epidermal / dermal shaving  Lesion diameter (cm):  0.7 Informed consent: discussed and consent obtained   Patient was prepped and draped in usual sterile fashion: Area prepped with alcohol. Anesthesia: the lesion was anesthetized in a standard fashion   Anesthetic:  1% lidocaine w/ epinephrine 1-100,000 buffered w/ 8.4% NaHCO3 Instrument used: flexible razor blade   Hemostasis achieved with: pressure, aluminum chloride and electrodesiccation   Outcome: patient tolerated procedure well   Post-procedure details: wound care instructions given   Post-procedure details comment:  Ointment and small  bandage applied  Specimen 1 - Surgical pathology Differential Diagnosis: Lichenoid keratosis vs BCC > amelanotic melanoma  Check Margins: No  Left Upper Arm  Epidermal / dermal shaving  Lesion diameter (cm):  0.3 Informed consent: discussed and consent obtained   Patient was prepped and draped in usual sterile fashion: Area prepped with alcohol. Anesthesia: the lesion was anesthetized in a standard fashion   Anesthetic:  1% lidocaine w/ epinephrine 1-100,000 buffered w/ 8.4% NaHCO3 Instrument used: flexible razor blade   Hemostasis achieved with: pressure, aluminum chloride and electrodesiccation   Outcome: patient tolerated procedure well   Post-procedure details: wound care instructions given   Post-procedure details comment:  Ointment and small bandage applied  Specimen 2 - Surgical pathology Differential Diagnosis: R/O atypia  Check Margins: No  Right Flank  Epidermal / dermal shaving  Lesion diameter (cm):  0.5 Informed consent: discussed and consent obtained   Patient was prepped and draped in usual sterile fashion: Area prepped with alcohol. Anesthesia: the lesion was anesthetized in a standard fashion   Anesthetic:  1% lidocaine w/ epinephrine 1-100,000 buffered w/ 8.4% NaHCO3 Instrument used: flexible razor blade   Hemostasis achieved with: pressure, aluminum chloride and electrodesiccation   Outcome: patient tolerated procedure well   Post-procedure details: wound care instructions given   Post-procedure details comment:  Ointment and small bandage applied  Specimen 3 - Surgical pathology Differential Diagnosis: R/O atypia   Check Margins: No  Spitting suture, initial encounter right calf  Spitting suture removed from MIS excision site    Return in about 3 months (around 01/29/2023) for TBSE, HxMM, HxMIS.  I, Emelia Salisbury, CMA, am acting as scribe for Forest Gleason, MD.  Documentation: I have reviewed the above documentation for accuracy and  completeness, and I agree with the above.  Forest Gleason, MD

## 2022-11-06 ENCOUNTER — Other Ambulatory Visit: Payer: Medicare Other

## 2022-11-06 ENCOUNTER — Other Ambulatory Visit: Payer: Self-pay | Admitting: Internal Medicine

## 2022-11-06 DIAGNOSIS — E119 Type 2 diabetes mellitus without complications: Secondary | ICD-10-CM | POA: Diagnosis not present

## 2022-11-06 DIAGNOSIS — I10 Essential (primary) hypertension: Secondary | ICD-10-CM | POA: Diagnosis not present

## 2022-11-07 ENCOUNTER — Telehealth: Payer: Self-pay

## 2022-11-07 LAB — CBC WITH DIFFERENTIAL/PLATELET
Basophils Absolute: 0 10*3/uL (ref 0.0–0.2)
Basos: 1 %
EOS (ABSOLUTE): 0.2 10*3/uL (ref 0.0–0.4)
Eos: 3 %
Hematocrit: 44.6 % (ref 37.5–51.0)
Hemoglobin: 15 g/dL (ref 13.0–17.7)
Immature Grans (Abs): 0 10*3/uL (ref 0.0–0.1)
Immature Granulocytes: 0 %
Lymphocytes Absolute: 2.3 10*3/uL (ref 0.7–3.1)
Lymphs: 33 %
MCH: 30.7 pg (ref 26.6–33.0)
MCHC: 33.6 g/dL (ref 31.5–35.7)
MCV: 91 fL (ref 79–97)
Monocytes Absolute: 0.6 10*3/uL (ref 0.1–0.9)
Monocytes: 8 %
Neutrophils Absolute: 4 10*3/uL (ref 1.4–7.0)
Neutrophils: 55 %
Platelets: 238 10*3/uL (ref 150–450)
RBC: 4.88 x10E6/uL (ref 4.14–5.80)
RDW: 13.2 % (ref 11.6–15.4)
WBC: 7.1 10*3/uL (ref 3.4–10.8)

## 2022-11-07 LAB — COMPREHENSIVE METABOLIC PANEL
ALT: 15 IU/L (ref 0–44)
AST: 20 IU/L (ref 0–40)
Albumin/Globulin Ratio: 2.3 — ABNORMAL HIGH (ref 1.2–2.2)
Albumin: 4.5 g/dL (ref 3.8–4.8)
Alkaline Phosphatase: 65 IU/L (ref 44–121)
BUN/Creatinine Ratio: 12 (ref 10–24)
BUN: 13 mg/dL (ref 8–27)
Bilirubin Total: 0.5 mg/dL (ref 0.0–1.2)
CO2: 24 mmol/L (ref 20–29)
Calcium: 9.8 mg/dL (ref 8.6–10.2)
Chloride: 102 mmol/L (ref 96–106)
Creatinine, Ser: 1.09 mg/dL (ref 0.76–1.27)
Globulin, Total: 2 g/dL (ref 1.5–4.5)
Glucose: 139 mg/dL — ABNORMAL HIGH (ref 70–99)
Potassium: 4.5 mmol/L (ref 3.5–5.2)
Sodium: 140 mmol/L (ref 134–144)
Total Protein: 6.5 g/dL (ref 6.0–8.5)
eGFR: 69 mL/min/{1.73_m2} (ref >59)

## 2022-11-07 LAB — PSA, SERUM (SERIAL MONITOR): Prostate Specific Ag, Serum: 2.5 ng/mL (ref 0.0–4.0)

## 2022-11-07 LAB — LIPID PANEL W/O CHOL/HDL RATIO
Cholesterol, Total: 183 mg/dL (ref 100–199)
HDL: 75 mg/dL (ref >39)
LDL Chol Calc (NIH): 84 mg/dL (ref 0–99)
Triglycerides: 142 mg/dL (ref 0–149)
VLDL Cholesterol Cal: 24 mg/dL (ref 5–40)

## 2022-11-07 LAB — HGB A1C W/O EAG: Hgb A1c MFr Bld: 6.6 % — ABNORMAL HIGH (ref 4.8–5.6)

## 2022-11-07 NOTE — Telephone Encounter (Signed)
-----   Message from Alfonso Patten, MD sent at 11/07/2022  8:40 AM EST ----- 1. Skin , left lower back SEBORRHEIC KERATOSIS, INFLAMED --> This is a benign growth or "wisdom spot". No additional treatment is needed.    2. 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 On the spectrum from normal mole to melanoma skin cancer, this is in between the two but closer towards a melanoma skin cancer.  - The treatment of choice for severely atypical moles is to cut them out in clinic with an area of normal looking skin around them to get all the atypical cells out. The skin that is removed will be sent to check under the microscope again to be sure it looks completely out.   - People who have a history of atypical moles do have a slightly increased risk of developing melanoma somewhere on the body, so a full body skin exam by a dermatologist is recommended at least once a year. - Monthly self skin checks and daily sun protection are also recommended.  - Please also call if you notice any new or changing spots anywhere else on the body before your follow-up visit.    3. Skin , right flank DYSPLASTIC COMPOUND NEVUS WITH MODERATE ATYPIA WITH SCAR AND PERSISTENT NEVUS-LIKE CHANGES, LIMITED MARGINS FREE, SEE DESCRIPTION --> recheck at f/u  MAs please call with results and schedule for excision. Thank you!

## 2022-11-07 NOTE — Telephone Encounter (Signed)
Advised patient of results and scheduled surgery appointment/hd 

## 2022-11-07 NOTE — Telephone Encounter (Signed)
Called patient. N/A. LMOVM for him to return my call regarding pathology results and to schedule surgery appt.

## 2022-11-11 ENCOUNTER — Encounter: Payer: Self-pay | Admitting: Internal Medicine

## 2022-11-11 ENCOUNTER — Ambulatory Visit (INDEPENDENT_AMBULATORY_CARE_PROVIDER_SITE_OTHER): Payer: Medicare Other | Admitting: Internal Medicine

## 2022-11-11 VITALS — BP 130/80 | HR 95 | Ht 68.0 in | Wt 269.6 lb

## 2022-11-11 DIAGNOSIS — E119 Type 2 diabetes mellitus without complications: Secondary | ICD-10-CM | POA: Diagnosis not present

## 2022-11-11 DIAGNOSIS — Z1331 Encounter for screening for depression: Secondary | ICD-10-CM

## 2022-11-11 DIAGNOSIS — Z0001 Encounter for general adult medical examination with abnormal findings: Secondary | ICD-10-CM | POA: Diagnosis not present

## 2022-11-11 DIAGNOSIS — I10 Essential (primary) hypertension: Secondary | ICD-10-CM

## 2022-11-11 DIAGNOSIS — E7800 Pure hypercholesterolemia, unspecified: Secondary | ICD-10-CM | POA: Diagnosis not present

## 2022-11-11 DIAGNOSIS — Z6841 Body Mass Index (BMI) 40.0 and over, adult: Secondary | ICD-10-CM

## 2022-11-11 LAB — GLUCOSE, POCT (MANUAL RESULT ENTRY): POC Glucose: 106 mg/dl — AB (ref 70–99)

## 2022-11-11 MED ORDER — ROSUVASTATIN CALCIUM 10 MG PO TABS: 10.0000 mg | ORAL_TABLET | Freq: Every day | ORAL | 0 refills | Status: DC

## 2022-11-11 NOTE — Progress Notes (Signed)
Established Patient Office Visit  Subjective:  Patient ID: Aaron Valdez, male    DOB: 02/15/1946  Age: 77 y.o. MRN: [id number]  Chief Complaint  Patient presents with   Medicare Wellness    AWV labs    No new complaints, here for AWV and refer to scanned documents.  Labs reviewed and notable for  well controlled lipids and diabetes although A1c slightly higher.      Past Medical History:  Diagnosis Date   Aortic aneurysm ([place])    possible   Arthritis    "ALL OVER"   Chronic kidney disease    RENAL INSUFF   Coronary artery disease    Diabetes mellitus ([place]) 10/14/2016   Dysplastic nevus 10/31/2022   Left upper arm. Severe atypia, peripheral margin involved. Excision scheduled.   Dysplastic nevus 10/31/2022   Right flank. Moderate atypia with scar and persistent nevus like changes. Margins free.   Dyspnea    WITH EXERTION   Essential hypertension 10/14/2016   History of hiatal hernia    HOH (hard of hearing)    Hyperlipidemia 10/14/2016   Inguinal hernia    Melanoma ([place]) 06/26/2022   Right forearm. Superficial spreading. Breslow'Daziyah Cogan 0.84m, Clark'Herley Bernardini level III. Excised 08/06/2022, margins free   [place] in situ [place] 06/26/2022   Right calf. Excised 08/06/2022, margins free.   Neuropathy    Obesity 10/14/2016    Social History   Socioeconomic History   Marital status: Married    Spouse name: Not on file   Number of children: Not on file   Years of education: Not on file   Highest education level: Not on file  Occupational History   Not on file  Tobacco Use   Smoking status: Former    Types: Cigarettes   Smokeless tobacco: Former  VScientific laboratory technicianUse: Never used  Substance and Sexual Activity   Alcohol use: No   Drug use: No   Sexual activity: Not on file  Other Topics Concern   Not on file  Social History Narrative   Not on file   Social Determinants of Health   Financial Resource Strain: Not on file  Food Insecurity: Not on file   Transportation Needs: Not on file  Physical Activity: Not on file  Stress: Not on file  Social Connections: Not on file  Intimate Partner Violence: Not on file    Family History  Problem Relation Age of Onset   Heart disease Father    AAA (abdominal aortic aneurysm) Father    Cerebral aneurysm Brother     Allergies  Allergen Reactions   Tape Rash    Review of Systems  Constitutional: Negative.   HENT: Negative.    Eyes: Negative.   Respiratory: Negative.    Cardiovascular: Negative.   Gastrointestinal: Negative.   Genitourinary: Negative.   Skin: Negative.   Neurological: Negative.   Endo/Heme/Allergies: Negative.        Objective:   BP 130/80   Pulse 95   Ht 5' 8"$  (1.727 m)   Wt 269 lb 9.6 oz (122.3 kg)   SpO2 94%   BMI 40.99 kg/m   Vitals:   11/11/22 1024  BP: 130/80  Pulse: 95  Height: 5' 8"$  (1.727 m)  Weight: 269 lb 9.6 oz (122.3 kg)  SpO2: 94%  BMI (Calculated): 41    Physical Exam Vitals reviewed.  Constitutional:      Appearance: Normal appearance. He is obese.  HENT:     Head: Normocephalic.  Left Ear: There is no impacted cerumen.     Nose: Nose normal.     Mouth/Throat:     Mouth: Mucous membranes are moist.     Pharynx: No posterior oropharyngeal erythema.  Eyes:     Extraocular Movements: Extraocular movements intact.     Pupils: Pupils are equal, round, and reactive to light.  Cardiovascular:     Rate and Rhythm: Regular rhythm.     Chest Wall: PMI is not displaced.     Pulses: Normal pulses.     Heart sounds: Normal heart sounds. No murmur heard. Pulmonary:     Effort: Pulmonary effort is normal.     Breath sounds: Normal air entry. No rhonchi or rales.  Abdominal:     General: Abdomen is flat. Bowel sounds are normal. There is no distension.     Palpations: Abdomen is soft. There is no hepatomegaly, splenomegaly or mass.     Tenderness: There is no abdominal tenderness.  Musculoskeletal:        General: Normal range  of motion.     Cervical back: Normal range of motion and neck supple.     Right lower leg: No edema.     Left lower leg: No edema.  Skin:    General: Skin is warm and dry.     Comments: Lipoma on left upper back  Neurological:     General: No focal deficit present.     Mental Status: He is alert and oriented to person, place, and time.     Cranial Nerves: No cranial nerve deficit.     Motor: No weakness.  Psychiatric:        Mood and Affect: Mood normal.        Behavior: Behavior normal.      No results found for any visits on 11/11/22.  Recent Results (from the past 2160 hour(Myah Guynes))  CBC with Differential/Platelet     Status: None   Collection Time: 11/06/22  9:27 AM  Result Value Ref Range   WBC 7.1 3.4 - 10.8 x10E3/uL   RBC 4.88 4.14 - 5.80 x10E6/uL   Hemoglobin 15.0 13.0 - 17.7 g/dL   Hematocrit 44.6 37.5 - 51.0 %   MCV 91 79 - 97 fL   MCH 30.7 26.6 - 33.0 pg   MCHC 33.6 31.5 - 35.7 g/dL   RDW 13.2 11.6 - 15.4 %   Platelets 238 150 - 450 x10E3/uL   Neutrophils 55 Not Estab. %   Lymphs 33 Not Estab. %   Monocytes 8 Not Estab. %   Eos 3 Not Estab. %   Basos 1 Not Estab. %   Neutrophils Absolute 4.0 1.4 - 7.0 x10E3/uL   Lymphocytes Absolute 2.3 0.7 - 3.1 x10E3/uL   Monocytes Absolute 0.6 0.1 - 0.9 x10E3/uL   EOS (ABSOLUTE) 0.2 0.0 - 0.4 x10E3/uL   Basophils Absolute 0.0 0.0 - 0.2 x10E3/uL   Immature Granulocytes 0 Not Estab. %   Immature Grans (Abs) 0.0 0.0 - 0.1 x10E3/uL  Comprehensive metabolic panel     Status: Abnormal   Collection Time: 11/06/22  9:27 AM  Result Value Ref Range   Glucose 139 (H) 70 - 99 mg/dL   BUN 13 8 - 27 mg/dL   Creatinine, Ser 1.09 0.76 - 1.27 mg/dL   eGFR 69 >59 mL/min/1.73   BUN/Creatinine Ratio 12 10 - 24   Sodium 140 134 - 144 mmol/L   Potassium 4.5 3.5 - 5.2 mmol/L   Chloride 102 96 -  106 mmol/L   CO2 24 20 - 29 mmol/L   Calcium 9.8 8.6 - 10.2 mg/dL   Total Protein 6.5 6.0 - 8.5 g/dL   Albumin 4.5 3.8 - 4.8 g/dL   Globulin,  Total 2.0 1.5 - 4.5 g/dL   Albumin/Globulin Ratio 2.3 (H) 1.2 - 2.2   Bilirubin Total 0.5 0.0 - 1.2 mg/dL   Alkaline Phosphatase 65 44 - 121 IU/L   AST 20 0 - 40 IU/L   ALT 15 0 - 44 IU/L  Lipid Panel w/o Chol/HDL Ratio     Status: None   Collection Time: 11/06/22  9:27 AM  Result Value Ref Range   Cholesterol, Total 183 100 - 199 mg/dL   Triglycerides 142 0 - 149 mg/dL   HDL 75 >39 mg/dL   VLDL Cholesterol Cal 24 5 - 40 mg/dL   LDL Chol Calc (NIH) 84 0 - 99 mg/dL  PSA, SERUM (SERIAL MONITOR)     Status: None   Collection Time: 11/06/22  9:27 AM  Result Value Ref Range   Prostate Specific Ag, Serum 2.5 0.0 - 4.0 ng/mL    Comment: Roche ECLIA methodology. According to the American Urological Association, Serum PSA should decrease and remain at undetectable levels after radical prostatectomy. The AUA defines biochemical recurrence as an initial PSA value 0.2 ng/mL or greater followed by a subsequent confirmatory PSA value 0.2 ng/mL or greater. Values obtained with different assay methods or kits cannot be used interchangeably. Results cannot be interpreted as absolute evidence of the presence or absence of malignant disease.   Hgb A1c w/o eAG     Status: Abnormal   Collection Time: 11/06/22  9:27 AM  Result Value Ref Range   Hgb A1c MFr Bld 6.6 (H) 4.8 - 5.6 %    Comment:          Prediabetes: 5.7 - 6.4          Diabetes: >6.4          Glycemic control for adults with diabetes: <7.0       Assessment & Plan:   Problem List Items Addressed This Visit   None 1. Type 2 diabetes mellitus without complication, without long-term current use of insulin (HCC) - POCT Glucose (CBG) - Hemoglobin A1c; Future  2. Essential hypertension  3. Pure hypercholesterolemia - Lipid panel; Future - Hepatic function panel; Future - CK, total - rosuvastatin (CRESTOR) 10 MG tablet; Take 1 tablet (10 mg total) by mouth daily.  Dispense: 90 tablet; Refill: 0  4. Class 3 severe obesity due to  excess calories with serious comorbidity and body mass index (BMI) of 40.0 to 44.9 in adult [facility])    No follow-ups on file.   Total time spent: 30 minutes  Volanda Napoleon, MD  11/11/2022

## 2022-11-12 ENCOUNTER — Encounter: Payer: Self-pay | Admitting: Dermatology

## 2022-11-27 ENCOUNTER — Ambulatory Visit (INDEPENDENT_AMBULATORY_CARE_PROVIDER_SITE_OTHER): Payer: Medicare Other | Admitting: Dermatology

## 2022-11-27 ENCOUNTER — Encounter: Payer: Self-pay | Admitting: Dermatology

## 2022-11-27 DIAGNOSIS — D2262 Melanocytic nevi of left upper limb, including shoulder: Secondary | ICD-10-CM

## 2022-11-27 DIAGNOSIS — D492 Neoplasm of unspecified behavior of bone, soft tissue, and skin: Secondary | ICD-10-CM

## 2022-11-27 NOTE — Patient Instructions (Signed)
Wound Care Instructions for After Surgery  On the day following your surgery, you should begin doing daily dressing changes until your sutures are removed: Remove the bandage. Cleanse the wound gently with soap and water.  Make sure you then dry the skin surrounding the wound completely or the tape will not stick to the skin. Do not use cotton balls on the wound. After the wound is clean and dry, apply the ointment (either prescription antibiotic prescribed by your doctor or plain Vaseline if nothing was prescribed) gently with a Q-tip. If you are using a bandaid to cover: Apply a bandaid large enough to cover the entire wound. If you do not have a bandaid large enough to cover the wound OR if you are sensitive to bandaid adhesive: Cut a non-stick pad (such as Telfa) to fit the size of the wound.  Cover the wound with the non-stick pad. If the wound is draining, you may want to add a small amount of gauze on top of the non-stick pad for a little added compression to the area. Use tape to seal the area completely.  For the next 1-2 weeks: Be sure to keep the wound moist with ointment 24/7 to ensure best healing. If you are unable to cover the wound with a bandage to hold the ointment in place, you may need to reapply the ointment several times a day. Do not bend over or lift heavy items to reduce the chance of elevated blood pressure to the wound. Do not participate in particularly strenuous activities.  Below is a list of dressing supplies you might need.  Cotton-tipped applicators - Q-tips Gauze pads (2x2 and/or 4x4) - All-Purpose Sponges New and clean tube of petroleum jelly (Vaseline) OR prescription antibiotic ointment if prescribed Either a bandaid large enough to cover the entire wound OR non-stick dressing material (Telfa) and Tape (Paper or Hypafix)  FOR ADULT SURGERY PATIENTS: If you need something for pain relief, you may take 1 extra strength Tylenol (acetaminophen) and 2  ibuprofen (200 mg) together every 4 hours as needed. (Do not take these medications if you are allergic to them or if you know you cannot take them for any other reason). Typically you may only need pain medication for 1-3 days.   Comments on the Post-Operative Period Slight swelling and redness often appear around the wound. This is normal and will disappear within several days following the surgery. The healing wound will drain a brownish-red-yellow discharge during healing. This is a normal phase of wound healing. As the wound begins to heal, the drainage may increase in amount. Again, this drainage is normal. Notify us if the drainage becomes persistently bloody, excessively swollen, or intensely painful or develops a foul odor or red streaks.  The healing wound will also typically be itchy. This is normal. If you have severe or persistent pain, Notify us if the discomfort is severe or persistent. Avoid alcoholic beverages when taking pain medicine.  In Case of Wound Hemorrhage A wound hemorrhage is when the bandage suddenly becomes soaked with bright red blood and flows profusely. If this happens, sit down or lie down with your head elevated. If the wound has a dressing on it, do not remove the dressing. Apply pressure to the existing gauze. If the wound is not covered, use a gauze pad to apply pressure and continue applying the pressure for 20 minutes without peeking. DO NOT COVER THE WOUND WITH A LARGE TOWEL OR [place] CLOTH. Release your hand from the  wound site but do not remove the dressing. If the bleeding has stopped, gently clean around the wound. Leave the dressing in place for 24 hours if possible. This wait time allows the blood vessels to close off so that you do not spark a new round of bleeding by disrupting the newly clotted blood vessels with an immediate dressing change. If the bleeding does not subside, continue to hold pressure for 40 minutes. If bleeding continues, page your  physician, contact an After Hours clinic or go to the Emergency Room.  Due to recent changes in healthcare laws, you may see results of your pathology and/or laboratory studies on MyChart before the doctors have had a chance to review them. We understand that in some cases there may be results that are confusing or concerning to you. Please understand that not all results are received at the same time and often the doctors may need to interpret multiple results in order to provide you with the best plan of care or course of treatment. Therefore, we ask that you please give [place] 2 business days to thoroughly review all your results before contacting the office for clarification. Should we see a critical lab result, you will be contacted sooner.   If You Need Anything After Your Visit  If you have any questions or concerns for your doctor, please call our main line at [phone] and press option 4 to reach your doctor's medical assistant. If no one answers, please leave a voicemail as directed and we will return your call as soon as possible. Messages left after 4 pm will be answered the following business day.   You may also send [place] a message via [place]. We typically respond to MyChart messages within 1-2 business days.  For prescription refills, please ask your pharmacy to contact our office. Our fax number is [phone].  If you have an urgent issue when the clinic is closed that cannot wait until the next business day, you can page your doctor at the number below.    Please note that while we do our best to be available for urgent issues outside of office hours, we are not available 24/7.   If you have an urgent issue and are unable to reach [place], you may choose to seek medical care at your doctor's office, retail clinic, urgent care center, or emergency room.  If you have a medical emergency, please immediately call 911 or go to the emergency department.  Pager Numbers  - Dr. Nehemiah Massed:  [phone]  - Dr. Laurence Ferrari: [phone]  - Dr. Nicole Kindred: [phone]  In the event of inclement weather, please call our main line at [phone] for an update on the status of any delays or closures.  Dermatology Medication Tips: Please keep the boxes that topical medications come in in order to help keep track of the instructions about where and how to use these. Pharmacies typically print the medication instructions only on the boxes and not directly on the medication tubes.   If your medication is too expensive, please contact our office at [phone] option 4 or send [place] a message through [place].   We are unable to tell what your co-pay for medications will be in advance as this is different depending on your insurance coverage. However, we may be able to find a substitute medication at lower cost or fill out paperwork to get insurance to cover a needed medication.   If a prior authorization is required to get your medication covered by  your insurance company, please allow [place] 1-2 business days to complete this process.  Drug prices often vary depending on where the prescription is filled and some pharmacies may offer cheaper prices.  The website www.goodrx.com contains coupons for medications through different pharmacies. The prices here do not account for what the cost may be with help from insurance (it may be cheaper with your insurance), but the website can give you the price if you did not use any insurance.  - You can print the associated coupon and take it with your prescription to the pharmacy.  - You may also stop by our office during regular business hours and pick up a GoodRx coupon card.  - If you need your prescription sent electronically to a different pharmacy, notify our office through [facility] or by phone at [phone] option 4.     Si Usted Necesita Algo Despus de Su Visita  Tambin puede enviarnos un mensaje a travs de Pharmacist, community. Por lo general  respondemos a los mensajes de MyChart en el transcurso de 1 a 2 das hbiles.  Para renovar recetas, por favor pida a su farmacia que se ponga en contacto con nuestra oficina. Harland Dingwall de fax es [place] [phone].  Si tiene un asunto urgente cuando la clnica est cerrada y que no puede esperar hasta el siguiente da hbil, puede llamar/localizar a su doctor(a) al nmero que aparece a continuacin.   Por favor, tenga en cuenta que aunque hacemos todo lo posible para estar disponibles para asuntos urgentes fuera del horario de [place], no estamos disponibles las 24 horas del da, los 7 das de la [place].   Si tiene un problema urgente y no puede comunicarse con nosotros, puede optar por buscar atencin mdica  en el consultorio de su doctor(a), en una clnica privada, en un centro de atencin urgente o en una sala de emergencias.  Si tiene Engineering geologist, por favor llame inmediatamente al 911 o vaya a la sala de emergencias.  Nmeros de bper  - Dr. Nehemiah Massed: [phone]  - Dra. Moye: [phone]  - Dra. Nicole Kindred: [phone]  En caso de inclemencias del [place], por favor llame a Johnsie Kindred principal al [phone] para una actualizacin sobre el [place] de cualquier retraso o cierre.  Consejos para la medicacin en dermatologa: Por favor, guarde las cajas en las que vienen los medicamentos de uso tpico para ayudarle a seguir las instrucciones sobre dnde y cmo usarlos. Las farmacias generalmente imprimen las instrucciones del medicamento slo en las cajas y no directamente en los tubos del [place].   Si su medicamento es muy caro, por favor, pngase en contacto con Zigmund Daniel llamando al [phone] y presione la opcin 4 o envenos un mensaje a travs de Pharmacist, community.   No podemos decirle cul ser su copago por los medicamentos por adelantado ya que esto es diferente dependiendo de la cobertura de su seguro. Sin embargo, es posible que podamos encontrar un  medicamento sustituto a Electrical engineer un formulario para que el seguro cubra el medicamento que se considera necesario.   Si se requiere una autorizacin previa para que su compaa de seguros [place] su medicamento, por favor permtanos de 1 a 2 das hbiles para completar este proceso.  Los precios de los medicamentos varan con frecuencia dependiendo del Environmental consultant de dnde se surte la receta y alguna farmacias pueden ofrecer precios ms baratos.  El sitio web www.goodrx.com tiene cupones para medicamentos de Airline pilot. Los precios aqu no tienen  en cuenta lo que podra costar con la ayuda del seguro (puede ser ms barato con su seguro), pero el sitio web puede darle el precio si no Field seismologist.  - Puede imprimir el cupn correspondiente y llevarlo con su receta a la farmacia.  - Tambin puede pasar por nuestra oficina durante el horario de atencin regular y Charity fundraiser una tarjeta de cupones de GoodRx.  - Si necesita que su receta se enve electrnicamente a una farmacia diferente, informe a nuestra oficina a travs de MyChart de [place] o por telfono llamando al [phone] y presione la opcin 4.

## 2022-11-27 NOTE — Progress Notes (Signed)
   Follow-Up Visit   Subjective  Aaron Valdez is a 77 y.o. 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 ).   The following portions of the chart were reviewed this encounter and updated as appropriate:   Tobacco  Allergies  Meds  Problems  Med Hx  Surg Hx  Fam Hx      Review of Systems:  No other skin or systemic complaints except as noted in HPI or Assessment and Plan.  Objective  Well appearing patient in no apparent distress; mood and affect are within normal limits.  A focused examination was performed including left arm. Relevant physical exam findings are noted in the Assessment and Plan.  Left Upper Arm Healing bx site    Assessment & Plan  Neoplasm of skin Left Upper Arm  Skin excision  Lesion length (cm):  0.7 Total excision diameter (cm):  1.7 Informed consent: discussed and consent obtained   Timeout: patient name, date of birth, surgical site, and procedure verified   Procedure prep:  Patient was prepped and draped in usual sterile fashion Prep type:  Chlorhexidine Anesthesia: the lesion was anesthetized in a standard fashion   Anesthetic:  1% lidocaine w/ epinephrine 1-100,000 buffered w/ 8.4% NaHCO3 (6 cc lido w/epi, 6 cc bupivicaine) Instrument used: #15 blade   Hemostasis achieved with: pressure and electrodesiccation    Skin repair Complexity:  Intermediate Final length (cm):  4.4 Informed consent: discussed and consent obtained   Timeout: patient name, date of birth, surgical site, and procedure verified   Procedure prep:  Patient was prepped and draped in usual sterile fashion Prep type:  Chlorhexidine Anesthesia: the lesion was anesthetized in a standard fashion   Anesthetic:  1% lidocaine w/ epinephrine 1-100,000 local infiltration Reason for type of repair: reduce tension to allow closure, reduce the risk of dehiscence, infection, and necrosis, preserve  normal anatomy and preserve normal anatomical and functional relationships   Undermining: edges undermined   Subcutaneous layers (deep stitches):  Suture size:  3-0 Suture type: Vicryl (polyglactin 910)   Fine/surface layer approximation (top stitches):  Suture size:  4-0 Suture type: Prolene (polypropylene)   Suture removal (days):  7 Hemostasis achieved with: suture, pressure and electrodesiccation Outcome: patient tolerated procedure well with no complications   Post-procedure details: wound care instructions given   Additional details:  Mupirocin and a pressure dressing applied  Specimen 1 - Surgical pathology Differential Diagnosis: 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  Graciella Belton, RMA, am acting as scribe for Forest Gleason, MD .  Documentation: I have reviewed the above documentation for accuracy and completeness, and I agree with the above.  Forest Gleason, MD

## 2022-11-28 ENCOUNTER — Other Ambulatory Visit: Payer: Self-pay | Admitting: Internal Medicine

## 2022-12-04 ENCOUNTER — Ambulatory Visit (INDEPENDENT_AMBULATORY_CARE_PROVIDER_SITE_OTHER): Payer: Medicare Other | Admitting: Dermatology

## 2022-12-04 VITALS — BP 146/80 | HR 89

## 2022-12-04 DIAGNOSIS — Z86018 Personal history of other benign neoplasm: Secondary | ICD-10-CM

## 2022-12-04 DIAGNOSIS — Z4802 Encounter for removal of sutures: Secondary | ICD-10-CM

## 2022-12-04 NOTE — Patient Instructions (Signed)
Due to recent changes in healthcare laws, you may see results of your pathology and/or laboratory studies on MyChart before the doctors have had a chance to review them. We understand that in some cases there may be results that are confusing or concerning to you. Please understand that not all results are received at the same time and often the doctors may need to interpret multiple results in order to provide you with the best plan of care or course of treatment. Therefore, we ask that you please give us 2 business days to thoroughly review all your results before contacting the office for clarification. Should we see a critical lab result, you will be contacted sooner.   If You Need Anything After Your Visit  If you have any questions or concerns for your doctor, please call our main line at [phone] and press option 4 to reach your doctor's medical assistant. If no one answers, please leave a voicemail as directed and we will return your call as soon as possible. Messages left after 4 pm will be answered the following business day.   You may also send us a message via MyChart. We typically respond to MyChart messages within 1-2 business days.  For prescription refills, please ask your pharmacy to contact our office. Our fax number is [phone].  If you have an urgent issue when the clinic is closed that cannot wait until the next business day, you can page your doctor at the number below.    Please note that while we do our best to be available for urgent issues outside of office hours, we are not available 24/7.   If you have an urgent issue and are unable to reach us, you may choose to seek medical care at your doctor's office, retail clinic, urgent care center, or emergency room.  If you have a medical emergency, please immediately call 911 or go to the emergency department.  Pager Numbers  - Dr. Kowalski: [phone]  - Dr. Moye: [phone]  - Dr. Stewart:  [phone]  In the event of inclement weather, please call our main line at [phone] for an update on the status of any delays or closures.  Dermatology Medication Tips: Please keep the boxes that topical medications come in in order to help keep track of the instructions about where and how to use these. Pharmacies typically print the medication instructions only on the boxes and not directly on the medication tubes.   If your medication is too expensive, please contact our office at [phone] option 4 or send us a message through MyChart.   We are unable to tell what your co-pay for medications will be in advance as this is different depending on your insurance coverage. However, we may be able to find a substitute medication at lower cost or fill out paperwork to get insurance to cover a needed medication.   If a prior authorization is required to get your medication covered by your insurance company, please allow us 1-2 business days to complete this process.  Drug prices often vary depending on where the prescription is filled and some pharmacies may offer cheaper prices.  The website www.goodrx.com contains coupons for medications through different pharmacies. The prices here do not account for what the cost may be with help from insurance (it may be cheaper with your insurance), but the website can give you the price if you did not use any insurance.  - You can print the associated coupon and take it with   your prescription to the pharmacy.  - You may also stop by our office during regular business hours and pick up a GoodRx coupon card.  - If you need your prescription sent electronically to a different pharmacy, notify our office through [place] MyChart or by phone at [phone] option 4.     Si Usted Necesita Algo Despus de Su Visita  Tambin puede enviarnos un mensaje a travs de MyChart. Por lo general respondemos a los mensajes de MyChart en el transcurso de 1 a 2  das hbiles.  Para renovar recetas, por favor pida a su farmacia que se ponga en contacto con nuestra oficina. Nuestro nmero de fax es el [phone].  Si tiene un asunto urgente cuando la clnica est cerrada y que no puede esperar hasta el siguiente da hbil, puede llamar/localizar a su doctor(a) al nmero que aparece a continuacin.   Por favor, tenga en cuenta que aunque hacemos todo lo posible para estar disponibles para asuntos urgentes fuera del horario de oficina, no estamos disponibles las 24 horas del da, los 7 das de la semana.   Si tiene un problema urgente y no puede comunicarse con nosotros, puede optar por buscar atencin mdica  en el consultorio de su doctor(a), en una clnica privada, en un centro de atencin urgente o en una sala de emergencias.  Si tiene una emergencia mdica, por favor llame inmediatamente al 911 o vaya a la sala de emergencias.  Nmeros de bper  - Dr. Kowalski: [phone]  - Dra. Moye: [phone]  - Dra. Stewart: [phone]  En caso de inclemencias del tiempo, por favor llame a nuestra lnea principal al [phone] para una actualizacin sobre el estado de cualquier retraso o cierre.  Consejos para la medicacin en dermatologa: Por favor, guarde las cajas en las que vienen los medicamentos de uso tpico para ayudarle a seguir las instrucciones sobre dnde y cmo usarlos. Las farmacias generalmente imprimen las instrucciones del medicamento slo en las cajas y no directamente en los tubos del medicamento.   Si su medicamento es muy caro, por favor, pngase en contacto con nuestra oficina llamando al [phone] y presione la opcin 4 o envenos un mensaje a travs de MyChart.   No podemos decirle cul ser su copago por los medicamentos por adelantado ya que esto es diferente dependiendo de la cobertura de su seguro. Sin embargo, es posible que podamos encontrar un medicamento sustituto a menor costo o llenar un formulario para que el  seguro cubra el medicamento que se considera necesario.   Si se requiere una autorizacin previa para que su compaa de seguros cubra su medicamento, por favor permtanos de 1 a 2 das hbiles para completar este proceso.  Los precios de los medicamentos varan con frecuencia dependiendo del lugar de dnde se surte la receta y alguna farmacias pueden ofrecer precios ms baratos.  El sitio web www.goodrx.com tiene cupones para medicamentos de diferentes farmacias. Los precios aqu no tienen en cuenta lo que podra costar con la ayuda del seguro (puede ser ms barato con su seguro), pero el sitio web puede darle el precio si no utiliz ningn seguro.  - Puede imprimir el cupn correspondiente y llevarlo con su receta a la farmacia.  - Tambin puede pasar por nuestra oficina durante el horario de atencin regular y recoger una tarjeta de cupones de GoodRx.  - Si necesita que su receta se enve electrnicamente a una farmacia diferente, informe a nuestra oficina a travs de MyChart de [place]   o por telfono llamando al [phone] y presione la opcin 4.  

## 2022-12-04 NOTE — Progress Notes (Signed)
   Follow-Up Visit   Subjective  Aaron Valdez is a 77 y.o. male who presents for the following: Post op/suture removal (L upper arm pathology proven margins free severely dysplastic nevus. ).   The following portions of the chart were reviewed this encounter and updated as appropriate:   Tobacco  Allergies  Meds  Problems  Med Hx  Surg Hx  Fam Hx      Review of Systems:  No other skin or systemic complaints except as noted in HPI or Assessment and Plan.  Objective  Well appearing patient in no apparent distress; mood and affect are within normal limits.  A focused examination was performed including the face and L arm. Relevant physical exam findings are noted in the Assessment and Plan.  L upper arm Healing excision site.     Assessment & Plan  History of dysplastic nevus L upper arm  Encounter for Removal of Sutures - Incision site at the L upper arm is clean, dry and intact - Wound cleansed, sutures removed, wound cleansed and steri strips applied.  - Discussed pathology results showing residual dysplastic nevus with margins free - Patient advised to keep steri-strips dry until they fall off. - Scars remodel for a full year. - Once steri-strips fall off, patient can apply over-the-counter silicone scar cream each night to help with scar remodeling if desired. - Patient advised to call with any concerns or if they notice any new or changing lesions.    Return for appointment as scheduled.  Luther Redo, CMA, am acting as scribe for Forest Gleason, MD .  Documentation: I have reviewed the above documentation for accuracy and completeness, and I agree with the above.  Forest Gleason, MD

## 2022-12-09 ENCOUNTER — Encounter: Payer: Self-pay | Admitting: Dermatology

## 2022-12-23 ENCOUNTER — Other Ambulatory Visit: Payer: Self-pay | Admitting: Internal Medicine

## 2022-12-30 ENCOUNTER — Other Ambulatory Visit: Payer: Self-pay | Admitting: Internal Medicine

## 2023-01-29 ENCOUNTER — Ambulatory Visit: Payer: Medicare Other | Admitting: Dermatology

## 2023-01-29 ENCOUNTER — Telehealth: Payer: Self-pay

## 2023-01-29 VITALS — BP 135/79 | HR 82

## 2023-01-29 DIAGNOSIS — X32XXXA Exposure to sunlight, initial encounter: Secondary | ICD-10-CM

## 2023-01-29 DIAGNOSIS — L570 Actinic keratosis: Secondary | ICD-10-CM | POA: Diagnosis not present

## 2023-01-29 DIAGNOSIS — L814 Other melanin hyperpigmentation: Secondary | ICD-10-CM

## 2023-01-29 DIAGNOSIS — Z86018 Personal history of other benign neoplasm: Secondary | ICD-10-CM

## 2023-01-29 DIAGNOSIS — W908XXA Exposure to other nonionizing radiation, initial encounter: Secondary | ICD-10-CM | POA: Diagnosis not present

## 2023-01-29 DIAGNOSIS — Z86006 Personal history of melanoma in-situ: Secondary | ICD-10-CM | POA: Diagnosis not present

## 2023-01-29 DIAGNOSIS — L578 Other skin changes due to chronic exposure to nonionizing radiation: Secondary | ICD-10-CM | POA: Diagnosis not present

## 2023-01-29 DIAGNOSIS — D1801 Hemangioma of skin and subcutaneous tissue: Secondary | ICD-10-CM

## 2023-01-29 DIAGNOSIS — Z1283 Encounter for screening for malignant neoplasm of skin: Secondary | ICD-10-CM | POA: Diagnosis not present

## 2023-01-29 DIAGNOSIS — L80 Vitiligo: Secondary | ICD-10-CM | POA: Diagnosis not present

## 2023-01-29 DIAGNOSIS — Z85820 Personal history of malignant melanoma of skin: Secondary | ICD-10-CM

## 2023-01-29 DIAGNOSIS — D2371 Other benign neoplasm of skin of right lower limb, including hip: Secondary | ICD-10-CM | POA: Diagnosis not present

## 2023-01-29 DIAGNOSIS — D2262 Melanocytic nevi of left upper limb, including shoulder: Secondary | ICD-10-CM | POA: Diagnosis not present

## 2023-01-29 DIAGNOSIS — D485 Neoplasm of uncertain behavior of skin: Secondary | ICD-10-CM

## 2023-01-29 DIAGNOSIS — L821 Other seborrheic keratosis: Secondary | ICD-10-CM

## 2023-01-29 DIAGNOSIS — D229 Melanocytic nevi, unspecified: Secondary | ICD-10-CM

## 2023-01-29 NOTE — Telephone Encounter (Signed)
Discussed with patient to pick up lab order to check thyroid levels. Pt seeing Dr. Ellsworth Lennox next week and he will take the lab order to their office and have him order it with his other labs.

## 2023-01-29 NOTE — Progress Notes (Signed)
Follow-Up Visit   Subjective  Aaron Valdez is a 77 y.o. male who presents for the following: Skin Cancer Screening and Full Body Skin Exam  The patient presents for Total-Body Skin Exam (TBSE) for skin cancer screening and mole check. The patient has spots, moles and lesions to be evaluated, some may be new or changing and the patient has concerns that these could be cancer.   The following portions of the chart were reviewed this encounter and updated as appropriate: medications, allergies, medical history  Review of Systems:  No other skin or systemic complaints except as noted in HPI or Assessment and Plan.  Objective  Well appearing patient in no apparent distress; mood and affect are within normal limits.  A full examination was performed including scalp, head, eyes, ears, nose, lips, neck, chest, axillae, abdomen, back, buttocks, bilateral upper extremities, bilateral lower extremities, hands, feet, fingers, toes, fingernails, and toenails. All findings within normal limits unless otherwise noted below.   Relevant physical exam findings are noted in the Assessment and Plan.  L post shoulder 0.9 cm pink and tan papule.     L forehear x 1, R lat eyebrow x 1 (2) Erythematous thin papules/macules with gritty scale.     Assessment & Plan   LENTIGINES, SEBORRHEIC KERATOSES, HEMANGIOMAS - Benign normal skin lesions - Benign-appearing - Call for any changes  MELANOCYTIC NEVI - Tan-brown and/or pink-flesh-colored symmetric macules and papules - Benign appearing on exam today - Observation - Call clinic for new or changing moles - Recommend daily use of broad spectrum spf 30+ sunscreen to sun-exposed areas.   ACTINIC DAMAGE - Chronic condition, secondary to cumulative UV/sun exposure - diffuse scaly erythematous macules with underlying dyspigmentation - Recommend daily broad spectrum sunscreen SPF 30+ to sun-exposed areas, reapply every 2 hours as needed.  - Staying  in the shade or wearing long sleeves, sun glasses (UVA+UVB protection) and wide brim hats (4-inch brim around the entire circumference of the hat) are also recommended for sun protection.  - Call for new or changing lesions.  HISTORY OF MELANOMA IN SITU - R calf - No evidence of recurrence today - Recommend regular full body skin exams - Recommend daily broad spectrum sunscreen SPF 30+ to sun-exposed areas, reapply every 2 hours as needed.  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  - Call if any new or changing lesions are noted between office visits  HISTORY OF DYSPLASTIC NEVUS No evidence of recurrence today Recommend regular full body skin exams Recommend daily broad spectrum sunscreen SPF 30+ to sun-exposed areas, reapply every 2 hours as needed.  Call if any new or changing lesions are noted between office visits  Neoplasm of uncertain behavior of skin L post shoulder  Epidermal / dermal shaving  Lesion diameter (cm):  0.9 Informed consent: discussed and consent obtained   Timeout: patient name, date of birth, surgical site, and procedure verified   Procedure prep:  Patient was prepped and draped in usual sterile fashion Prep type:  Isopropyl alcohol Anesthesia: the lesion was anesthetized in a standard fashion   Anesthetic:  1% lidocaine w/ epinephrine 1-100,000 buffered w/ 8.4% NaHCO3 Instrument used: flexible razor blade   Hemostasis achieved with: pressure,  aluminum chloride and electrodesiccation   Outcome: patient tolerated procedure well   Post-procedure details: sterile dressing applied and wound care instructions given   Dressing type: bandage and petrolatum    Specimen 1 - Surgical  pathology Differential Diagnosis: D48.5 BCC r/o atypia Check Margins: No  R/O BCC vs atypia  AK (actinic keratosis) (2) L forehear x 1, R lat eyebrow x 1  Actinic keratoses are precancerous spots that appear secondary to cumulative UV radiation exposure/sun exposure over time. They are chronic with expected duration over 1 year. A portion of actinic keratoses will progress to squamous cell carcinoma of the skin. It is not possible to reliably predict which spots will progress to skin cancer and so treatment is recommended to prevent development of skin cancer.  Recommend daily broad spectrum sunscreen SPF 30+ to sun-exposed areas, reapply every 2 hours as needed.  Recommend staying in the shade or wearing long sleeves, sun glasses (UVA+UVB protection) and wide brim hats (4-inch brim around the entire circumference of the hat). Call for new or changing lesions.  Prior to procedure, discussed risks of blister formation, small wound, skin dyspigmentation, or rare scar following cryotherapy. Recommend Vaseline ointment to treated areas while healing.  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 Not at goal (has not had thyroid testing performed).  Vitiligo is a chronic autoimmune condition which causes loss of skin pigment and is commonly seen on the face and may also involve areas of trauma like hands, elbows, knees, and ankles. There is no cure  and it is difficult to treat.  Treatments include topical steroids and other topical anti-inflammatory ointments/creams and topical and oral Jak inhibitors.  Sometimes narrow band UV light therapy or Xtrac laser is helpful, both of which require twice weekly treatments for at least 3-6 months.  Antioxidant vitamins, such as Vitamins A,C,E,D, Folic Acid and B12 may be added to enhance treatment. Heliocare may also enhance treatment results.  Treatment Plan: Pt not bothered by it. No tx needed. TSH with rfx to T4 ordered. Already monitoring closely for melanoma given history.  Recommend daily broad spectrum sunscreen SPF 30+ to sun-exposed areas, reapply every 2 hours as needed. Call for new or changing lesions.  Staying in the shade or wearing long sleeves, sun glasses (UVA+UVB protection) and wide brim hats (4-inch brim around the entire circumference of the hat) are also recommended for sun protection.    DERMATOFIBROMA - R lat pretibia Exam: Firm pink/brown papulenodule with dimple sign. Treatment Plan: A dermatofibroma is a benign growth possibly related to trauma, such as an insect bite, cut from shaving, or inflamed acne-type bump.  Treatment options to remove include shave or excision with resulting scar and risk of recurrence.  Since benign-appearing and not bothersome, will observe for now.   SKIN CANCER SCREENING PERFORMED TODAY.  Return in about 3 months (around 05/01/2023) for TBSE - Hx MMIS, Hx MM schedule with Dr. Katrinka Blazing.  Maylene Roes, CMA,  am acting as scribe for Darden Dates, MD .  Documentation: I have reviewed the above documentation for accuracy and completeness, and I agree with the above.  Darden Dates, MD

## 2023-01-29 NOTE — Patient Instructions (Addendum)
Wound Care Instructions  Cleanse wound gently with soap and water once a day then pat dry with clean gauze. Apply a thin coat of Petrolatum (petroleum jelly, "Vaseline") over the wound (unless you have an allergy to this). We recommend that you use a new, sterile tube of Vaseline. Do not pick or remove scabs. Do not remove the yellow or white "healing tissue" from the base of the wound.  Cover the wound with fresh, clean, nonstick gauze and secure with paper tape. You may use Band-Aids in place of gauze and tape if the wound is small enough, but would recommend trimming much of the tape off as there is often too much. Sometimes Band-Aids can irritate the skin.  You should call the office for your biopsy report after 1 week if you have not already been contacted.  If you experience any problems, such as abnormal amounts of bleeding, swelling, significant bruising, significant pain, or evidence of infection, please call the office immediately.  FOR ADULT SURGERY PATIENTS: If you need something for pain relief you may take 1 extra strength Tylenol (acetaminophen) AND 2 Ibuprofen (200mg each) together every 4 hours as needed for pain. (do not take these if you are allergic to them or if you have a reason you should not take them.) Typically, you may only need pain medication for 1 to 3 days.     Due to recent changes in healthcare laws, you may see results of your pathology and/or laboratory studies on MyChart before the doctors have had a chance to review them. We understand that in some cases there may be results that are confusing or concerning to you. Please understand that not all results are received at the same time and often the doctors may need to interpret multiple results in order to provide you with the best plan of care or course of treatment. Therefore, we ask that you please give us 2 business days to thoroughly review all your results before contacting the office for clarification. Should  we see a critical lab result, you will be contacted sooner.   If You Need Anything After Your Visit  If you have any questions or concerns for your doctor, please call our main line at [phone] and press option 4 to reach your doctor's medical assistant. If no one answers, please leave a voicemail as directed and we will return your call as soon as possible. Messages left after 4 pm will be answered the following business day.   You may also send us a message via MyChart. We typically respond to MyChart messages within 1-2 business days.  For prescription refills, please ask your pharmacy to contact our office. Our fax number is [phone].  If you have an urgent issue when the clinic is closed that cannot wait until the next business day, you can page your doctor at the number below.    Please note that while we do our best to be available for urgent issues outside of office hours, we are not available 24/7.   If you have an urgent issue and are unable to reach us, you may choose to seek medical care at your doctor's office, retail clinic, urgent care center, or emergency room.  If you have a medical emergency, please immediately call 911 or go to the emergency department.  Pager Numbers  - Dr. Kowalski: [phone]  - Dr. Moye: [phone]  - Dr. Stewart: [phone]  In the event of inclement weather, please call our main line at   [phone] for an update on the status of any delays or closures.  Dermatology Medication Tips: Please keep the boxes that topical medications come in in order to help keep track of the instructions about where and how to use these. Pharmacies typically print the medication instructions only on the boxes and not directly on the medication tubes.   If your medication is too expensive, please contact our office at [phone] option 4 or send us a message through MyChart.   We are unable to tell what your co-pay for medications will be in  advance as this is different depending on your insurance coverage. However, we may be able to find a substitute medication at lower cost or fill out paperwork to get insurance to cover a needed medication.   If a prior authorization is required to get your medication covered by your insurance company, please allow us 1-2 business days to complete this process.  Drug prices often vary depending on where the prescription is filled and some pharmacies may offer cheaper prices.  The website www.goodrx.com contains coupons for medications through different pharmacies. The prices here do not account for what the cost may be with help from insurance (it may be cheaper with your insurance), but the website can give you the price if you did not use any insurance.  - You can print the associated coupon and take it with your prescription to the pharmacy.  - You may also stop by our office during regular business hours and pick up a GoodRx coupon card.  - If you need your prescription sent electronically to a different pharmacy, notify our office through [place] MyChart or by phone at [phone] option 4.     Si Usted Necesita Algo Despus de Su Visita  Tambin puede enviarnos un mensaje a travs de MyChart. Por lo general respondemos a los mensajes de MyChart en el transcurso de 1 a 2 das hbiles.  Para renovar recetas, por favor pida a su farmacia que se ponga en contacto con nuestra oficina. Nuestro nmero de fax es el [phone].  Si tiene un asunto urgente cuando la clnica est cerrada y que no puede esperar hasta el siguiente da hbil, puede llamar/localizar a su doctor(a) al nmero que aparece a continuacin.   Por favor, tenga en cuenta que aunque hacemos todo lo posible para estar disponibles para asuntos urgentes fuera del horario de oficina, no estamos disponibles las 24 horas del da, los 7 das de la semana.   Si tiene un problema urgente y no puede comunicarse con nosotros, puede  optar por buscar atencin mdica  en el consultorio de su doctor(a), en una clnica privada, en un centro de atencin urgente o en una sala de emergencias.  Si tiene una emergencia mdica, por favor llame inmediatamente al 911 o vaya a la sala de emergencias.  Nmeros de bper  - Dr. Kowalski: [phone]  - Dra. Moye: [phone]  - Dra. Stewart: [phone]  En caso de inclemencias del tiempo, por favor llame a nuestra lnea principal al [phone] para una actualizacin sobre el estado de cualquier retraso o cierre.  Consejos para la medicacin en dermatologa: Por favor, guarde las cajas en las que vienen los medicamentos de uso tpico para ayudarle a seguir las instrucciones sobre dnde y cmo usarlos. Las farmacias generalmente imprimen las instrucciones del medicamento slo en las cajas y no directamente en los tubos del medicamento.   Si su medicamento es muy caro, por favor, pngase en contacto con   nuestra oficina llamando al [phone] y presione la opcin 4 o envenos un mensaje a travs de MyChart.   No podemos decirle cul ser su copago por los medicamentos por adelantado ya que esto es diferente dependiendo de la cobertura de su seguro. Sin embargo, es posible que podamos encontrar un medicamento sustituto a menor costo o llenar un formulario para que el seguro cubra el medicamento que se considera necesario.   Si se requiere una autorizacin previa para que su compaa de seguros cubra su medicamento, por favor permtanos de 1 a 2 das hbiles para completar este proceso.  Los precios de los medicamentos varan con frecuencia dependiendo del lugar de dnde se surte la receta y alguna farmacias pueden ofrecer precios ms baratos.  El sitio web www.goodrx.com tiene cupones para medicamentos de diferentes farmacias. Los precios aqu no tienen en cuenta lo que podra costar con la ayuda del seguro (puede ser ms barato con su seguro), pero el sitio web puede darle el  precio si no utiliz ningn seguro.  - Puede imprimir el cupn correspondiente y llevarlo con su receta a la farmacia.  - Tambin puede pasar por nuestra oficina durante el horario de atencin regular y recoger una tarjeta de cupones de GoodRx.  - Si necesita que su receta se enve electrnicamente a una farmacia diferente, informe a nuestra oficina a travs de MyChart de [place] o por telfono llamando al [phone] y presione la opcin 4.  

## 2023-02-05 ENCOUNTER — Telehealth: Payer: Self-pay

## 2023-02-05 ENCOUNTER — Other Ambulatory Visit: Payer: Medicare Other

## 2023-02-05 DIAGNOSIS — I10 Essential (primary) hypertension: Secondary | ICD-10-CM | POA: Diagnosis not present

## 2023-02-05 DIAGNOSIS — L80 Vitiligo: Secondary | ICD-10-CM | POA: Diagnosis not present

## 2023-02-05 DIAGNOSIS — E7800 Pure hypercholesterolemia, unspecified: Secondary | ICD-10-CM | POA: Diagnosis not present

## 2023-02-05 DIAGNOSIS — E1169 Type 2 diabetes mellitus with other specified complication: Secondary | ICD-10-CM

## 2023-02-05 DIAGNOSIS — E119 Type 2 diabetes mellitus without complications: Secondary | ICD-10-CM

## 2023-02-05 NOTE — Telephone Encounter (Signed)
LVM for pt to call office for pathology results, JS

## 2023-02-05 NOTE — Telephone Encounter (Signed)
-----   Message from [place], MD sent at 02/05/2023  8:14 AM EDT ----- Skin , left post shoulder HYPERTROPHIC ACTINIC KERATOSIS , SOLAR LENTIGO WITH AN ADJACENT MELANOCYTIC NEVUS, INTRADERMAL TYPE -->   "Precancer spot together with a mole and a freckle" --> recommend LN2 in clinic at follow-up  MAs please call. Thank you!

## 2023-02-05 NOTE — Telephone Encounter (Signed)
Patient advised and will treat in office with Dr. Katrinka Blazing in August. aw

## 2023-02-06 LAB — CBC WITH DIFFERENTIAL
Basophils Absolute: 0 10*3/uL (ref 0.0–0.2)
Basos: 0 %
EOS (ABSOLUTE): 0.2 10*3/uL (ref 0.0–0.4)
Eos: 2 %
Hematocrit: 42.7 % (ref 37.5–51.0)
Hemoglobin: 14.4 g/dL (ref 13.0–17.7)
Immature Grans (Abs): 0 10*3/uL (ref 0.0–0.1)
Immature Granulocytes: 0 %
Lymphocytes Absolute: 2.7 10*3/uL (ref 0.7–3.1)
Lymphs: 33 %
MCH: 31 pg (ref 26.6–33.0)
MCHC: 33.7 g/dL (ref 31.5–35.7)
MCV: 92 fL (ref 79–97)
Monocytes Absolute: 0.6 10*3/uL (ref 0.1–0.9)
Monocytes: 7 %
Neutrophils Absolute: 4.7 10*3/uL (ref 1.4–7.0)
Neutrophils: 58 %
RBC: 4.65 x10E6/uL (ref 4.14–5.80)
RDW: 13 % (ref 11.6–15.4)
WBC: 8.3 10*3/uL (ref 3.4–10.8)

## 2023-02-06 LAB — HEMOGLOBIN A1C
Est. average glucose Bld gHb Est-mCnc: 148 mg/dL
Hgb A1c MFr Bld: 6.8 % — ABNORMAL HIGH (ref 4.8–5.6)

## 2023-02-06 LAB — CMP14+EGFR
ALT: 16 IU/L (ref 0–44)
AST: 15 IU/L (ref 0–40)
Albumin/Globulin Ratio: 1.9 (ref 1.2–2.2)
Albumin: 4.2 g/dL (ref 3.8–4.8)
Alkaline Phosphatase: 65 IU/L (ref 44–121)
BUN/Creatinine Ratio: 16 (ref 10–24)
BUN: 18 mg/dL (ref 8–27)
Bilirubin Total: 0.4 mg/dL (ref 0.0–1.2)
CO2: 25 mmol/L (ref 20–29)
Calcium: 9.6 mg/dL (ref 8.6–10.2)
Chloride: 99 mmol/L (ref 96–106)
Creatinine, Ser: 1.16 mg/dL (ref 0.76–1.27)
Globulin, Total: 2.2 g/dL (ref 1.5–4.5)
Glucose: 140 mg/dL — ABNORMAL HIGH (ref 70–99)
Potassium: 4.8 mmol/L (ref 3.5–5.2)
Sodium: 138 mmol/L (ref 134–144)
Total Protein: 6.4 g/dL (ref 6.0–8.5)
eGFR: 64 mL/min/{1.73_m2} (ref >59)

## 2023-02-06 LAB — TSH RFX ON ABNORMAL TO FREE T4: TSH: 3.13 u[IU]/mL (ref 0.450–4.500)

## 2023-02-06 LAB — TSH: TSH: 3.22 u[IU]/mL (ref 0.450–4.500)

## 2023-02-06 LAB — LIPID PANEL
Chol/HDL Ratio: 2.6 ratio (ref 0.0–5.0)
Cholesterol, Total: 188 mg/dL (ref 100–199)
HDL: 72 mg/dL (ref >39)
LDL Chol Calc (NIH): 89 mg/dL (ref 0–99)
Triglycerides: 159 mg/dL — ABNORMAL HIGH (ref 0–149)
VLDL Cholesterol Cal: 27 mg/dL (ref 5–40)

## 2023-02-10 ENCOUNTER — Encounter: Payer: Self-pay | Admitting: Internal Medicine

## 2023-02-10 ENCOUNTER — Ambulatory Visit (INDEPENDENT_AMBULATORY_CARE_PROVIDER_SITE_OTHER): Payer: Medicare Other | Admitting: Internal Medicine

## 2023-02-10 VITALS — BP 130/70 | HR 82 | Ht 68.0 in | Wt 271.2 lb

## 2023-02-10 DIAGNOSIS — E7800 Pure hypercholesterolemia, unspecified: Secondary | ICD-10-CM

## 2023-02-10 DIAGNOSIS — I10 Essential (primary) hypertension: Secondary | ICD-10-CM | POA: Diagnosis not present

## 2023-02-10 DIAGNOSIS — E782 Mixed hyperlipidemia: Secondary | ICD-10-CM

## 2023-02-10 DIAGNOSIS — E039 Hypothyroidism, unspecified: Secondary | ICD-10-CM

## 2023-02-10 DIAGNOSIS — Z6841 Body Mass Index (BMI) 40.0 and over, adult: Secondary | ICD-10-CM

## 2023-02-10 DIAGNOSIS — E119 Type 2 diabetes mellitus without complications: Secondary | ICD-10-CM

## 2023-02-10 LAB — POC CREATINE & ALBUMIN,URINE
Albumin/Creatinine Ratio, Urine, POC: 10
Creatinine, POC: 100 mg/dL
Microalbumin Ur, POC: <30 mg/L

## 2023-02-10 MED ORDER — PIOGLITAZONE HCL-METFORMIN HCL 15-850 MG PO TABS
1.0000 | ORAL_TABLET | Freq: Three times a day (TID) | ORAL | 0 refills | Status: DC
Start: 2023-02-10 — End: 2023-05-20

## 2023-02-10 MED ORDER — SEMAGLUTIDE (1 MG/DOSE) 4 MG/3ML ~~LOC~~ SOPN
1.0000 mg | PEN_INJECTOR | SUBCUTANEOUS | 1 refills | Status: DC
Start: 2023-03-31 — End: 2023-05-20

## 2023-02-10 MED ORDER — ROSUVASTATIN CALCIUM 10 MG PO TABS
10.0000 mg | ORAL_TABLET | Freq: Every day | ORAL | 0 refills | Status: DC
Start: 2023-02-10 — End: 2023-05-13

## 2023-02-10 NOTE — Progress Notes (Signed)
Established Patient Office Visit  Subjective:  Patient ID: Aaron Valdez, male    DOB: December 13, 1945  Age: 77 y.o. MRN: [id number]  Chief Complaint  Patient presents with   Follow-up    3 month lab results    No new complaints, here for lab review and medication refills. Labs reviewed and notable for well controlled diabetes, A1c  at target, LDL at target with unremarkable cmp. However trigs elevated to admitted dietary indiscretion. Denies any hypoglycemic episodes and home bg readings have been at target.     No other concerns at this time.   Past Medical History:  Diagnosis Date   Aortic aneurysm (HCC)    possible   Arthritis    "ALL OVER"   Chronic kidney disease    RENAL INSUFF   Coronary artery disease    Diabetes mellitus (HCC) 10/14/2016   Dysplastic nevus 10/31/2022   Left upper arm. Severe atypia, peripheral margin involved. Excision 11/27/22   Dysplastic nevus 10/31/2022   Right flank. Moderate atypia with scar and persistent nevus like changes. Margins free.   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 Superficial spreading. Breslow'Philippe Gang 0.63mm, Clark'Liona Wengert level III. 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 Excised 08/06/2022, margins free.   Neuropathy    Obesity 10/14/2016    Past Surgical History:  Procedure Laterality Date   CATARACT EXTRACTION W/PHACO Right 05/20/2017   Procedure: CATARACT EXTRACTION PHACO AND INTRAOCULAR LENS PLACEMENT (IOC);  Surgeon: Galen Manila, MD;  Location: [facility];  Service: Ophthalmology;  Laterality: Right;  [place] 01:03 AP% 19.9 CDE 12.53 Fluid pack lot # [id number]   CATARACT EXTRACTION W/PHACO Left 09/01/2018   Procedure: CATARACT EXTRACTION PHACO AND INTRAOCULAR LENS PLACEMENT (IOC);  Surgeon: Galen Manila, MD;   Location: [facility];  Service: Ophthalmology;  Laterality: Left;  [place] 01:22.3 CDE 13.36 Fluid pack Lot # [id number]   COLONOSCOPY N/A 11/22/2020   Procedure: COLONOSCOPY;  Surgeon: Toledo, Boykin Nearing, MD;  Location: [facility];  Service: Gastroenterology;  Laterality: N/A;   COLONOSCOPY WITH PROPOFOL N/A 10/17/2016   Procedure: COLONOSCOPY WITH PROPOFOL;  Surgeon: Midge Minium, MD;  Location: [facility];  Service: Endoscopy;  Laterality: N/A;   CORONARY ANGIOPLASTY     FRACTURE SURGERY Left    elbow   HIATAL HERNIA REPAIR     JOINT REPLACEMENT     NASAL SINUS SURGERY     POLYPECTOMY  10/17/2016   Procedure: POLYPECTOMY;  Surgeon: Midge Minium, MD;  Location: [facility];  Service: Endoscopy;;   TONSILLECTOMY     TOTAL KNEE ARTHROPLASTY Right 12/18/2020   Procedure: TOTAL KNEE ARTHROPLASTY;  Surgeon: Lyndle Herrlich, MD;  Location: [facility];  Service: Orthopedics;  Laterality: Right;   UMBILICAL HERNIA REPAIR      Social History   Socioeconomic History   Marital status: Married    Spouse name: Not on file   Number of children: Not on file   Years of education: Not on file   Highest education level: Not on file  Occupational History   Not on file  Tobacco Use   Smoking status: Former    Types: Cigarettes   Smokeless tobacco: Former  Building services engineer Use: Never used  Substance and Sexual  Activity   Alcohol use: No   Drug use: No   Sexual activity: Not on file  Other Topics Concern   Not on file  Social History Narrative   Not on file   Social Determinants of Health   Financial Resource Strain: Not on file  Food Insecurity: Not on file  Transportation Needs: Not on file  Physical Activity: Not on file  Stress: Not on file  Social Connections: Not on file  Intimate Partner Violence: Not on file    Family History  Problem Relation Age of Onset   Heart disease Father    AAA (abdominal aortic aneurysm) Father    Cerebral aneurysm Brother    Stomach  cancer Other     Allergies  Allergen Reactions   Tape Rash    Review of Systems  Constitutional: Negative.        5 lb weight gain  HENT: Negative.    Eyes: Negative.   Respiratory: Negative.    Cardiovascular: Negative.   Gastrointestinal: Negative.   Genitourinary: Negative.   Skin: Negative.   Neurological: Negative.   Endo/Heme/Allergies: Negative.        Objective:   BP 130/70   Pulse 82   Ht 5\' 8"  (1.727 m)   Wt 271 lb 3.2 oz (123 kg)   SpO2 93%   BMI 41.24 kg/m   Vitals:   02/10/23 1021  BP: 130/70  Pulse: 82  Height: 5\' 8"  (1.727 m)  Weight: 271 lb 3.2 oz (123 kg)  SpO2: 93%  BMI (Calculated): 41.25    Physical Exam Vitals reviewed.  Constitutional:      Appearance: Normal appearance. He is obese.  HENT:     Head: Normocephalic.     Left Ear: There is no impacted cerumen.     Nose: Nose normal.     Mouth/Throat:     Mouth: Mucous membranes are moist.     Pharynx: No posterior oropharyngeal erythema.  Eyes:     Extraocular Movements: Extraocular movements intact.     Pupils: Pupils are equal, round, and reactive to light.  Cardiovascular:     Rate and Rhythm: Regular rhythm.     Chest Wall: PMI is not displaced.     Pulses: Normal pulses.     Heart sounds: Normal heart sounds. No murmur heard. Pulmonary:     Effort: Pulmonary effort is normal.     Breath sounds: Normal air entry. No rhonchi or rales.  Abdominal:     General: Abdomen is flat. Bowel sounds are normal. There is no distension.     Palpations: Abdomen is soft. There is no hepatomegaly, splenomegaly or mass.     Tenderness: There is no abdominal tenderness.  Musculoskeletal:        General: Normal range of motion.     Cervical back: Normal range of motion and neck supple.     Right lower leg: No edema.     Left lower leg: No edema.  Skin:    General: Skin is warm and dry.     Comments: Lipoma on left upper back  Neurological:     General: No focal deficit present.      Mental Status: He is alert and oriented to person, place, and time.     Cranial Nerves: No cranial nerve deficit.     Motor: No weakness.  Psychiatric:        Mood and Affect: Mood normal.        Behavior: Behavior  normal.      No results found for any visits on 02/10/23.  Recent Results (from the past 2160 hour(Takuma Cifelli))  Hemoglobin A1c     Status: Abnormal   Collection Time: 02/05/23  9:44 AM  Result Value Ref Range   Hgb A1c MFr Bld 6.8 (H) 4.8 - 5.6 %    Comment:          Prediabetes: 5.7 - 6.4          Diabetes: >6.4          Glycemic control for adults with diabetes: <7.0    Est. average glucose Bld gHb Est-mCnc 148 mg/dL  TSH     Status: None   Collection Time: 02/05/23  9:44 AM  Result Value Ref Range   TSH 3.220 0.450 - 4.500 uIU/mL  CMP14+EGFR     Status: Abnormal   Collection Time: 02/05/23  9:44 AM  Result Value Ref Range   Glucose 140 (H) 70 - 99 mg/dL   BUN 18 8 - 27 mg/dL   Creatinine, Ser 1.61 0.76 - 1.27 mg/dL   eGFR 64 >09 UE/AVW/0.98   BUN/Creatinine Ratio 16 10 - 24   Sodium 138 134 - 144 mmol/L   Potassium 4.8 3.5 - 5.2 mmol/L   Chloride 99 96 - 106 mmol/L   CO2 25 20 - 29 mmol/L   Calcium 9.6 8.6 - 10.2 mg/dL   Total Protein 6.4 6.0 - 8.5 g/dL   Albumin 4.2 3.8 - 4.8 g/dL   Globulin, Total 2.2 1.5 - 4.5 g/dL   Albumin/Globulin Ratio 1.9 1.2 - 2.2   Bilirubin Total 0.4 0.0 - 1.2 mg/dL   Alkaline Phosphatase 65 44 - 121 IU/L   AST 15 0 - 40 IU/L   ALT 16 0 - 44 IU/L  Lipid panel     Status: Abnormal   Collection Time: 02/05/23  9:44 AM  Result Value Ref Range   Cholesterol, Total 188 100 - 199 mg/dL   Triglycerides 119 (H) 0 - 149 mg/dL   HDL 72 >14 mg/dL   VLDL Cholesterol Cal 27 5 - 40 mg/dL   LDL Chol Calc (NIH) 89 0 - 99 mg/dL   Chol/HDL Ratio 2.6 0.0 - 5.0 ratio    Comment:                                   T. Chol/HDL Ratio                                             Men  Women                               1/2 Avg.Risk  3.4    3.3                                    Avg.Risk  5.0    4.4                                2X Avg.Risk  9.6    7.1  3X Avg.Risk 23.4   11.0   CBC With Differential     Status: None   Collection Time: 02/05/23  9:44 AM  Result Value Ref Range   WBC 8.3 3.4 - 10.8 x10E3/uL   RBC 4.65 4.14 - 5.80 x10E6/uL   Hemoglobin 14.4 13.0 - 17.7 g/dL   Hematocrit 09.8 11.9 - 51.0 %   MCV 92 79 - 97 fL   MCH 31.0 26.6 - 33.0 pg   MCHC 33.7 31.5 - 35.7 g/dL   RDW 14.7 82.9 - 56.2 %   Neutrophils 58 Not Estab. %   Lymphs 33 Not Estab. %   Monocytes 7 Not Estab. %   Eos 2 Not Estab. %   Basos 0 Not Estab. %   Neutrophils Absolute 4.7 1.4 - 7.0 x10E3/uL   Lymphocytes Absolute 2.7 0.7 - 3.1 x10E3/uL   Monocytes Absolute 0.6 0.1 - 0.9 x10E3/uL   EOS (ABSOLUTE) 0.2 0.0 - 0.4 x10E3/uL   Basophils Absolute 0.0 0.0 - 0.2 x10E3/uL   Immature Granulocytes 0 Not Estab. %   Immature Grans (Abs) 0.0 0.0 - 0.1 x10E3/uL  TSH Rfx on Abnormal to Free T4     Status: None   Collection Time: 02/05/23  9:49 AM  Result Value Ref Range   TSH 3.130 0.450 - 4.500 uIU/mL      Assessment & Plan:   Problem List Items Addressed This Visit       Cardiovascular and Mediastinum   Essential hypertension - Primary   Relevant Medications   rosuvastatin (CRESTOR) 10 MG tablet     Endocrine   Diabetes mellitus (HCC)   Relevant Medications   rosuvastatin (CRESTOR) 10 MG tablet   Other Relevant Orders   POC CREATINE & ALBUMIN,URINE     Other   Hyperlipidemia   Relevant Medications   rosuvastatin (CRESTOR) 10 MG tablet    No follow-ups on file.   Total time spent: 30 minutes  Luna Fuse, MD  02/10/2023   This document may have been prepared by [facility] Voice Recognition software and as such may include unintentional dictation errors.

## 2023-02-10 NOTE — Addendum Note (Signed)
Addended by: Aundria Mems AHMAD on: 02/10/2023 03:31 PM   Modules accepted: Orders

## 2023-02-11 ENCOUNTER — Telehealth: Payer: Self-pay

## 2023-02-11 NOTE — Telephone Encounter (Signed)
Discussed thyroid test results, WNL. Patient voiced understanding.

## 2023-02-11 NOTE — Telephone Encounter (Signed)
-----   Message from Sandi Mealy, MD sent at 02/06/2023 10:21 AM EDT ----- Thyroid testing normal  MAs please call. Thank you!

## 2023-03-07 ENCOUNTER — Telehealth: Payer: Self-pay | Admitting: Dermatology

## 2023-03-07 NOTE — Telephone Encounter (Signed)
Please send a certified letter. Thank you!    Dear Aaron Valdez,   I hope this letter finds you well.  It has been my sincere pleasure to be a part of your healthcare team. Our records show that you have a precancerous spot called an actinic keratosis that has not yet been treated at the left shoulder.  Since I will be unable to follow-up with you, I am writing to encourage you to keep your appointment for treatment of the precancer spot and to reach out if this spot changes or grows before your follow-up appointment is scheduled.   A small portion of actinic keratoses will progress to skin cancer. Yours in particular is very thick and so at higher risk of turning into skin cancer.  If you have any concerns or are unable to make your appointment, you can reach [place] by phone at [phone] option 4 or through [employer].   As I am leaving the practice, I regret that I will not be able to provide any future care for you. However, I am confident you are in capable hands with my colleagues and the team at [facility].   Thank you for entrusting me with your skin health. 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 Please do not hesitate to reach out if you have any questions or concerns.  Wishing you continued health and well-being.  Sincerely,  Sandi Mealy, MD, MPH Dermatologist [facility] [phone] option 4

## 2023-03-13 ENCOUNTER — Encounter: Payer: Self-pay | Admitting: Dermatology

## 2023-03-17 ENCOUNTER — Other Ambulatory Visit: Payer: Self-pay | Admitting: Nurse Practitioner

## 2023-04-03 ENCOUNTER — Other Ambulatory Visit: Payer: Self-pay | Admitting: Nurse Practitioner

## 2023-04-29 DIAGNOSIS — R809 Proteinuria, unspecified: Secondary | ICD-10-CM | POA: Diagnosis not present

## 2023-04-29 DIAGNOSIS — E1129 Type 2 diabetes mellitus with other diabetic kidney complication: Secondary | ICD-10-CM | POA: Diagnosis not present

## 2023-04-29 DIAGNOSIS — N182 Chronic kidney disease, stage 2 (mild): Secondary | ICD-10-CM | POA: Diagnosis not present

## 2023-04-29 DIAGNOSIS — I10 Essential (primary) hypertension: Secondary | ICD-10-CM | POA: Diagnosis not present

## 2023-05-01 ENCOUNTER — Ambulatory Visit: Payer: Medicare Other | Admitting: Dermatology

## 2023-05-01 ENCOUNTER — Encounter: Payer: Self-pay | Admitting: Dermatology

## 2023-05-01 DIAGNOSIS — Z872 Personal history of diseases of the skin and subcutaneous tissue: Secondary | ICD-10-CM

## 2023-05-01 DIAGNOSIS — L720 Epidermal cyst: Secondary | ICD-10-CM

## 2023-05-01 DIAGNOSIS — Z1283 Encounter for screening for malignant neoplasm of skin: Secondary | ICD-10-CM

## 2023-05-01 DIAGNOSIS — L578 Other skin changes due to chronic exposure to nonionizing radiation: Secondary | ICD-10-CM | POA: Diagnosis not present

## 2023-05-01 DIAGNOSIS — D171 Benign lipomatous neoplasm of skin and subcutaneous tissue of trunk: Secondary | ICD-10-CM

## 2023-05-01 DIAGNOSIS — Z86018 Personal history of other benign neoplasm: Secondary | ICD-10-CM | POA: Diagnosis not present

## 2023-05-01 DIAGNOSIS — L570 Actinic keratosis: Secondary | ICD-10-CM | POA: Diagnosis not present

## 2023-05-01 DIAGNOSIS — Z85820 Personal history of malignant melanoma of skin: Secondary | ICD-10-CM

## 2023-05-01 DIAGNOSIS — Z86006 Personal history of melanoma in-situ: Secondary | ICD-10-CM | POA: Diagnosis not present

## 2023-05-01 DIAGNOSIS — D1801 Hemangioma of skin and subcutaneous tissue: Secondary | ICD-10-CM | POA: Diagnosis not present

## 2023-05-01 DIAGNOSIS — W908XXA Exposure to other nonionizing radiation, initial encounter: Secondary | ICD-10-CM

## 2023-05-01 DIAGNOSIS — D229 Melanocytic nevi, unspecified: Secondary | ICD-10-CM

## 2023-05-01 DIAGNOSIS — L821 Other seborrheic keratosis: Secondary | ICD-10-CM

## 2023-05-01 DIAGNOSIS — D1779 Benign lipomatous neoplasm of other sites: Secondary | ICD-10-CM

## 2023-05-01 DIAGNOSIS — L814 Other melanin hyperpigmentation: Secondary | ICD-10-CM | POA: Diagnosis not present

## 2023-05-01 NOTE — Progress Notes (Signed)
Follow-Up Visit   Subjective  Aaron Valdez is a 77 y.o. male who presents for the following: Skin Cancer Screening and Full Body Skin Exam  The patient presents for Total-Body Skin Exam (TBSE) for skin cancer screening and mole check. The patient has spots, moles and lesions to be evaluated, some may be new or changing and the patient may have concern these could be cancer.  Patient with hx of MM, MMis, dysplastic nevi.   The following portions of the chart were reviewed this encounter and updated as appropriate: medications, allergies, medical history  Review of Systems:  No other skin or systemic complaints except as noted in HPI or Assessment and Plan.  Objective  Well appearing patient in no apparent distress; mood and affect are within normal limits.  A full examination was performed including scalp, head, eyes, ears, nose, lips, neck, chest, axillae, abdomen, back, buttocks, bilateral upper extremities, bilateral lower extremities, hands, feet, fingers, toes, fingernails, and toenails. All findings within normal limits unless otherwise noted below.   Relevant physical exam findings are noted in the Assessment and Plan.  left dorsal hand x 1 Erythematous thin papules/macules with gritty scale.     Assessment & Plan   SKIN CANCER SCREENING PERFORMED TODAY.  ACTINIC DAMAGE - Chronic condition, secondary to cumulative UV/sun exposure - diffuse scaly erythematous macules with underlying dyspigmentation - Recommend daily broad spectrum sunscreen SPF 30+ to sun-exposed areas, reapply every 2 hours as needed.  - Staying in the shade or wearing long sleeves, sun glasses (UVA+UVB protection) and wide brim hats (4-inch brim around the entire circumference of the hat) are also recommended for sun protection.  - Call for new or changing lesions.  LENTIGINES, SEBORRHEIC KERATOSES, HEMANGIOMAS - Benign normal skin lesions - Benign-appearing - Call for any changes  MELANOCYTIC  NEVI - Tan-brown and/or pink-flesh-colored symmetric macules and papules - Benign appearing on exam today - Observation - Call clinic for new or changing moles - Recommend daily use of broad spectrum spf 30+ sunscreen to sun-exposed areas.   History of Dysplastic Nevi - No evidence of recurrence today - Recommend regular full body skin exams - Recommend daily broad spectrum sunscreen SPF 30+ to sun-exposed areas, reapply every 2 hours as needed.  - Call if any new or changing lesions are noted between office visits  HISTORY OF MELANOMA - Right forearm. Superficial spreading. Breslow's 0.29mm, Clark's level III. Excised 08/06/2022 - No evidence of recurrence today - No lymphadenopathy - Recommend regular full body skin exams - Recommend daily broad spectrum sunscreen SPF 30+ to sun-exposed areas, reapply every 2 hours as needed.  - Call if any new or changing lesions are noted between office visits  HISTORY OF MELANOMA IN SITU - Right calf. Excised 08/06/2022  - No evidence of recurrence today - Recommend regular full body skin exams - Recommend daily broad spectrum sunscreen SPF 30+ to sun-exposed areas, reapply every 2 hours as needed.  - Call if any new or changing lesions are noted between office visits  HISTORY OF PRECANCEROUS ACTINIC KERATOSIS - left post shoulder biopsy 01/29/23 - site(s) of PreCancerous Actinic Keratosis clear today. - these may recur and new lesions may form requiring treatment to prevent transformation into skin cancer - observe for new or changing spots and contact [name] for appointment if occur - photoprotection with sun protective clothing; sunglasses and broad spectrum sunscreen with SPF of at least 30 + and frequent self skin exams recommended - yearly exams by  a dermatologist recommended for persons with history of PreCancerous Actinic Keratoses  EPIDERMAL INCLUSION CYST Exam: Subcutaneous nodule at back  Benign-appearing. Exam most  consistent with an epidermal inclusion cyst. Discussed that a cyst is a benign growth that can grow over time and sometimes get irritated or inflamed. Recommend observation if it is not bothersome. Discussed option of surgical excision to remove it if it is growing, symptomatic, or other changes noted. Please call for new or changing lesions so they can be evaluated.  AK (actinic keratosis) left dorsal hand x 1  Actinic keratoses are precancerous spots that appear secondary to cumulative UV radiation exposure/sun exposure over time. They are chronic with expected duration over 1 year. A portion of actinic keratoses will progress to squamous cell carcinoma of the skin. It is not possible to reliably predict which spots will progress to skin cancer and so treatment is recommended to prevent development of skin cancer.  Recommend daily broad spectrum sunscreen SPF 30+ to sun-exposed areas, reapply every 2 hours as needed.  Recommend staying in the shade or wearing long sleeves, sun glasses (UVA+UVB protection) and wide brim hats (4-inch brim around the entire circumference of the hat). Call for new or changing lesions.   Destruction of lesion - left dorsal hand x 1  Destruction method: cryotherapy   Informed consent: discussed and consent obtained   Lesion destroyed using liquid nitrogen: Yes   Cryotherapy cycles:  2 Outcome: patient tolerated procedure well with no complications   Post-procedure details: wound care instructions given    Lipoma  Exam: Subcutaneous rubbery nodule(s) Location: left upper back  Benign-appearing. Exam most consistent with a lipoma. Discussed that a lipoma is a benign fatty growth that can grow over time and sometimes get irritated. Recommend observation if it is not bothersome or changing. Discussed option of ILK injections or surgical excision to remove it if it is growing, symptomatic, or other changes noted. Please call for new or changing lesions so they can be  evaluated.  Return in about 3 months (around 08/01/2023) for TBSE, Hx MMis, Hx MM, Hx Dysplastic Nevi, Hx AK.  Aaron Valdez, RMA, am acting as scribe for Elie Goody, MD .   Documentation: I have reviewed the above documentation for accuracy and completeness, and I agree with the above.  Elie Goody, MD

## 2023-05-01 NOTE — Patient Instructions (Addendum)
Cryotherapy Aftercare  Wash gently with soap and water everyday.   Apply Vaseline and Band-Aid daily until healed.    Melanoma ABCDEs  Melanoma is the most dangerous type of skin cancer, and is the leading cause of death from skin disease.  You are more likely to develop melanoma if you: Have light-colored skin, light-colored eyes, or red or blond hair Spend a lot of time in the sun Tan regularly, either outdoors or in a tanning bed Have had blistering sunburns, especially during childhood Have a close family member who has had a melanoma Have atypical moles or large birthmarks  Early detection of melanoma is key since treatment is typically straightforward and cure rates are extremely high if we catch it early.   The first sign of melanoma is often a change in a mole or a new dark spot.  The ABCDE system is a way of remembering the signs of melanoma.  A for asymmetry:  The two halves do not match. B for border:  The edges of the growth are irregular. C for color:  A mixture of colors are present instead of an even brown color. D for diameter:  Melanomas are usually (but not always) greater than 6mm - the size of a pencil eraser. E for evolution:  The spot keeps changing in size, shape, and color.  Please check your skin once per month between visits. You can use a small mirror in front and a large mirror behind you to keep an eye on the back side or your body.   If you see any new or changing lesions before your next follow-up, please call to schedule a visit.  Please continue daily skin protection including broad spectrum sunscreen SPF 30+ to sun-exposed areas, reapplying every 2 hours as needed when you're outdoors.    Due to recent changes in healthcare laws, you may see results of your pathology and/or laboratory studies on MyChart before the doctors have had a chance to review them. We understand that in some cases there may be results that are confusing or concerning to you.  Please understand that not all results are received at the same time and often the doctors may need to interpret multiple results in order to provide you with the best plan of care or course of treatment. Therefore, we ask that you please give [place] 2 business days to thoroughly review all your results before contacting the office for clarification. Should we see a critical lab result, you will be contacted sooner.   If You Need Anything After Your Visit  If you have any questions or concerns for your doctor, please call our main line at [phone] and press option 4 to reach your doctor's medical assistant. If no one answers, please leave a voicemail as directed and we will return your call as soon as possible. Messages left after 4 pm will be answered the following business day.   You may also send [place] a message via MyChart. We typically respond to MyChart messages within 1-2 business days.  For prescription refills, please ask your pharmacy to contact our office. Our fax number is [phone].  If you have an urgent issue when the clinic is closed that cannot wait until the next business day, you can page your doctor at the number below.    Please note that while we do our best to be available for urgent issues outside of office hours, we are not available 24/7.   If you have an urgent  issue and are unable to reach [place], you may choose to seek medical care at your doctor's office, retail clinic, urgent care center, or emergency room.  If you have a medical emergency, please immediately call 911 or go to the emergency department.  Pager Numbers  - Dr. Gwen Pounds: [phone]  - Dr. Roseanne Reno: [phone]  In the event of inclement weather, please call our main line at [phone] for an update on the status of any delays or closures.  Dermatology Medication Tips: Please keep the boxes that topical medications come in in order to help keep track of the instructions about where and how to use  these. Pharmacies typically print the medication instructions only on the boxes and not directly on the medication tubes.   If your medication is too expensive, please contact our office at [phone] option 4 or send [place] a message through MyChart.   We are unable to tell what your co-pay for medications will be in advance as this is different depending on your insurance coverage. However, we may be able to find a substitute medication at lower cost or fill out paperwork to get insurance to cover a needed medication.   If a prior authorization is required to get your medication covered by your insurance company, please allow [place] 1-2 business days to complete this process.  Drug prices often vary depending on where the prescription is filled and some pharmacies may offer cheaper prices.  The website www.goodrx.com contains coupons for medications through different pharmacies. The prices here do not account for what the cost may be with help from insurance (it may be cheaper with your insurance), but the website can give you the price if you did not use any insurance.  - You can print the associated coupon and take it with your prescription to the pharmacy.  - You may also stop by our office during regular business hours and pick up a GoodRx coupon card.  - If you need your prescription sent electronically to a different pharmacy, notify our office through [facility] or by phone at [phone] option 4.

## 2023-05-05 DIAGNOSIS — H43813 Vitreous degeneration, bilateral: Secondary | ICD-10-CM | POA: Diagnosis not present

## 2023-05-05 DIAGNOSIS — H35413 Lattice degeneration of retina, bilateral: Secondary | ICD-10-CM | POA: Diagnosis not present

## 2023-05-05 DIAGNOSIS — H40003 Preglaucoma, unspecified, bilateral: Secondary | ICD-10-CM | POA: Diagnosis not present

## 2023-05-05 DIAGNOSIS — E119 Type 2 diabetes mellitus without complications: Secondary | ICD-10-CM | POA: Diagnosis not present

## 2023-05-06 ENCOUNTER — Encounter: Payer: Self-pay | Admitting: Internal Medicine

## 2023-05-06 DIAGNOSIS — I10 Essential (primary) hypertension: Secondary | ICD-10-CM | POA: Diagnosis not present

## 2023-05-06 DIAGNOSIS — E1129 Type 2 diabetes mellitus with other diabetic kidney complication: Secondary | ICD-10-CM | POA: Diagnosis not present

## 2023-05-12 ENCOUNTER — Other Ambulatory Visit: Payer: Self-pay | Admitting: Internal Medicine

## 2023-05-12 DIAGNOSIS — E782 Mixed hyperlipidemia: Secondary | ICD-10-CM

## 2023-05-14 ENCOUNTER — Other Ambulatory Visit: Payer: Medicare Other

## 2023-05-14 ENCOUNTER — Other Ambulatory Visit: Payer: Self-pay

## 2023-05-14 DIAGNOSIS — E782 Mixed hyperlipidemia: Secondary | ICD-10-CM

## 2023-05-14 DIAGNOSIS — E119 Type 2 diabetes mellitus without complications: Secondary | ICD-10-CM

## 2023-05-14 DIAGNOSIS — E039 Hypothyroidism, unspecified: Secondary | ICD-10-CM

## 2023-05-15 LAB — LIPID PANEL
Chol/HDL Ratio: 2.4 ratio (ref 0.0–5.0)
Cholesterol, Total: 175 mg/dL (ref 100–199)
HDL: 72 mg/dL (ref >39)
LDL Chol Calc (NIH): 80 mg/dL (ref 0–99)
Triglycerides: 134 mg/dL (ref 0–149)
VLDL Cholesterol Cal: 23 mg/dL (ref 5–40)

## 2023-05-15 LAB — HEMOGLOBIN A1C
Est. average glucose Bld gHb Est-mCnc: 143 mg/dL
Hgb A1c MFr Bld: 6.6 % — ABNORMAL HIGH (ref 4.8–5.6)

## 2023-05-15 LAB — TSH: TSH: 3.82 u[IU]/mL (ref 0.450–4.500)

## 2023-05-20 ENCOUNTER — Encounter: Payer: Self-pay | Admitting: Internal Medicine

## 2023-05-20 ENCOUNTER — Ambulatory Visit: Payer: Medicare Other | Admitting: Internal Medicine

## 2023-05-20 VITALS — BP 130/89 | HR 82 | Ht 68.0 in | Wt 266.8 lb

## 2023-05-20 DIAGNOSIS — E119 Type 2 diabetes mellitus without complications: Secondary | ICD-10-CM | POA: Diagnosis not present

## 2023-05-20 LAB — POCT CBG (FASTING - GLUCOSE)-MANUAL ENTRY: Glucose Fasting, POC: 112 mg/dL — AB (ref 70–99)

## 2023-05-20 MED ORDER — PIOGLITAZONE HCL-METFORMIN HCL 15-850 MG PO TABS: 1.0000 | ORAL_TABLET | Freq: Three times a day (TID) | ORAL | 0 refills | Status: DC

## 2023-05-20 MED ORDER — SEMAGLUTIDE (1 MG/DOSE) 4 MG/3ML ~~LOC~~ SOPN
1.0000 mg | PEN_INJECTOR | SUBCUTANEOUS | 0 refills | Status: AC
Start: 2023-05-20 — End: 2023-08-12

## 2023-05-20 MED ORDER — EMPAGLIFLOZIN 25 MG PO TABS
25.0000 mg | ORAL_TABLET | Freq: Every day | ORAL | 0 refills | Status: DC
Start: 2023-05-20 — End: 2023-08-25

## 2023-05-20 NOTE — Progress Notes (Signed)
Established Patient Office Visit  Subjective:  Patient ID: Aaron Valdez, male    DOB: 06-29-1946  Age: 77 y.o. MRN: [id number]  Chief Complaint  Patient presents with   Follow-up    3 mo f/u with lab results    No new complaints, here for lab review and medication refills. Labs reviewed and notable for well controlled diabetes, A1c at target, lipids at target with unremarkable cmp. Denies any hypoglycemic episodes and home bg readings have been at target.   No other concerns at this time.   Past Medical History:  Diagnosis Date   Aortic aneurysm (HCC)    possible   Arthritis    "ALL OVER"   Chronic kidney disease    RENAL INSUFF   Coronary artery disease    Diabetes mellitus (HCC) 10/14/2016   Dysplastic nevus 10/31/2022   Left upper arm. Severe atypia, peripheral margin involved. Excision 11/27/22   Dysplastic nevus 10/31/2022   Right flank. Moderate atypia with scar and persistent nevus like changes. Margins free.   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 Superficial spreading. Breslow'Hairo Garraway 0.12mm, Clark'Charlee Squibb level III. 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 Excised 08/06/2022, margins free.   Neuropathy    Obesity 10/14/2016    Past Surgical History:  Procedure Laterality Date   CATARACT EXTRACTION W/PHACO Right 05/20/2017   Procedure: CATARACT EXTRACTION PHACO AND INTRAOCULAR LENS PLACEMENT (IOC);  Surgeon: Galen Manila, MD;  Location: [facility];  Service: Ophthalmology;  Laterality: Right;  [place] 01:03 AP% 19.9 CDE 12.53 Fluid pack lot # [id number]   CATARACT EXTRACTION W/PHACO Left 09/01/2018   Procedure: CATARACT EXTRACTION PHACO AND INTRAOCULAR LENS PLACEMENT (IOC);  Surgeon: Galen Manila, MD;  Location: [facility];  Service: Ophthalmology;  Laterality:  Left;  [place] 01:22.3 CDE 13.36 Fluid pack Lot # [id number]   COLONOSCOPY N/A 11/22/2020   Procedure: COLONOSCOPY;  Surgeon: Toledo, Boykin Nearing, MD;  Location: [facility];  Service: Gastroenterology;  Laterality: N/A;   COLONOSCOPY WITH PROPOFOL N/A 10/17/2016   Procedure: COLONOSCOPY WITH PROPOFOL;  Surgeon: Midge Minium, MD;  Location: [facility];  Service: Endoscopy;  Laterality: N/A;   CORONARY ANGIOPLASTY     FRACTURE SURGERY Left    elbow   HIATAL HERNIA REPAIR     JOINT REPLACEMENT     NASAL SINUS SURGERY     POLYPECTOMY  10/17/2016   Procedure: POLYPECTOMY;  Surgeon: Midge Minium, MD;  Location: [facility];  Service: Endoscopy;;   TONSILLECTOMY     TOTAL KNEE ARTHROPLASTY Right 12/18/2020   Procedure: TOTAL KNEE ARTHROPLASTY;  Surgeon: Lyndle Herrlich, MD;  Location: [facility];  Service: Orthopedics;  Laterality: Right;   UMBILICAL HERNIA REPAIR      Social History   Socioeconomic History   Marital status: Married    Spouse name: Not on file   Number of children: Not on file   Years of education: Not on file   Highest education level: Not on file  Occupational History   Not on file  Tobacco Use   Smoking status: Former    Types: Cigarettes   Smokeless tobacco: Former  Advertising account planner   Vaping status: Never Used  Substance and Sexual Activity   Alcohol use: No  Drug use: No   Sexual activity: Not on file  Other Topics Concern   Not on file  Social History Narrative   Not on file   Social Determinants of Health   Financial Resource Strain: Not on file  Food Insecurity: Not on file  Transportation Needs: Not on file  Physical Activity: Not on file  Stress: Not on file  Social Connections: Not on file  Intimate Partner Violence: Not on file    Family History  Problem Relation Age of Onset   Heart disease Father    AAA (abdominal aortic aneurysm) Father    Cerebral aneurysm Brother    Stomach cancer Other     Allergies  Allergen Reactions    Tape Rash    Review of Systems  Constitutional:  Positive for weight loss (5 lbs).  HENT: Negative.    Eyes: Negative.   Respiratory: Negative.    Cardiovascular: Negative.   Gastrointestinal: Negative.   Genitourinary: Negative.   Skin: Negative.   Neurological: Negative.   Endo/Heme/Allergies: Negative.        Objective:   BP 130/89   Pulse 82   Ht 5\' 8"  (1.727 m)   Wt 266 lb 12.8 oz (121 kg)   SpO2 98%   BMI 40.57 kg/m   Vitals:   05/20/23 1308  BP: 130/89  Pulse: 82  Height: 5\' 8"  (1.727 m)  Weight: 266 lb 12.8 oz (121 kg)  SpO2: 98%  BMI (Calculated): 40.58    Physical Exam Vitals reviewed.  Constitutional:      Appearance: Normal appearance. He is obese.  HENT:     Head: Normocephalic.     Left Ear: There is no impacted cerumen.     Nose: Nose normal.     Mouth/Throat:     Mouth: Mucous membranes are moist.     Pharynx: No posterior oropharyngeal erythema.  Eyes:     Extraocular Movements: Extraocular movements intact.     Pupils: Pupils are equal, round, and reactive to light.  Cardiovascular:     Rate and Rhythm: Regular rhythm.     Chest Wall: PMI is not displaced.     Pulses: Normal pulses.     Heart sounds: Normal heart sounds. No murmur heard. Pulmonary:     Effort: Pulmonary effort is normal.     Breath sounds: Normal air entry. No rhonchi or rales.  Abdominal:     General: Abdomen is flat. Bowel sounds are normal. There is no distension.     Palpations: Abdomen is soft. There is no hepatomegaly, splenomegaly or mass.     Tenderness: There is no abdominal tenderness.  Musculoskeletal:        General: Normal range of motion.     Cervical back: Normal range of motion and neck supple.     Right lower leg: No edema.     Left lower leg: No edema.  Skin:    General: Skin is warm and dry.     Comments: Lipoma on left upper back  Neurological:     General: No focal deficit present.     Mental Status: He is alert and oriented to person,  place, and time.     Cranial Nerves: No cranial nerve deficit.     Motor: No weakness.  Psychiatric:        Mood and Affect: Mood normal.        Behavior: Behavior normal.      Results for orders placed or performed in  visit on 05/20/23  POCT CBG (Fasting - Glucose)  Result Value Ref Range   Glucose Fasting, POC 112 (A) 70 - 99 mg/dL    Recent Results (from the past 2160 hour(Taylar Hartsough))  TSH     Status: None   Collection Time: 05/14/23 11:51 AM  Result Value Ref Range   TSH 3.820 0.450 - 4.500 uIU/mL  Lipid panel     Status: None   Collection Time: 05/14/23 11:51 AM  Result Value Ref Range   Cholesterol, Total 175 100 - 199 mg/dL   Triglycerides 657 0 - 149 mg/dL   HDL 72 >84 mg/dL   VLDL Cholesterol Cal 23 5 - 40 mg/dL   LDL Chol Calc (NIH) 80 0 - 99 mg/dL   Chol/HDL Ratio 2.4 0.0 - 5.0 ratio    Comment:                                   T. Chol/HDL Ratio                                             Men  Women                               1/2 Avg.Risk  3.4    3.3                                   Avg.Risk  5.0    4.4                                2X Avg.Risk  9.6    7.1                                3X Avg.Risk 23.4   11.0   Hemoglobin A1c     Status: Abnormal   Collection Time: 05/14/23 11:51 AM  Result Value Ref Range   Hgb A1c MFr Bld 6.6 (H) 4.8 - 5.6 %    Comment:          Prediabetes: 5.7 - 6.4          Diabetes: >6.4          Glycemic control for adults with diabetes: <7.0    Est. average glucose Bld gHb Est-mCnc 143 mg/dL  POCT CBG (Fasting - Glucose)     Status: Abnormal   Collection Time: 05/20/23  1:15 PM  Result Value Ref Range   Glucose Fasting, POC 112 (A) 70 - 99 mg/dL      Assessment & Plan:  As per problem list. Standing labs ordered. Problem List Items Addressed This Visit       Endocrine   Diabetes mellitus (HCC) - Primary   Relevant Medications   pioglitazone-metformin (ACTOPLUS MET) 15-850 MG tablet   Semaglutide, 1 MG/DOSE, 4 MG/3ML SOPN    empagliflozin (JARDIANCE) 25 MG TABS tablet   Other Relevant Orders   POCT CBG (Fasting - Glucose) (Completed)    Return in about 3 months (around 08/20/2023) for fu with labs prior.  Total time spent: 20 minutes  Luna Fuse, MD  05/20/2023   This document may have been prepared by [facility] Voice Recognition software and as such may include unintentional dictation errors.

## 2023-06-27 ENCOUNTER — Other Ambulatory Visit: Payer: Self-pay | Admitting: Family

## 2023-06-27 ENCOUNTER — Other Ambulatory Visit: Payer: Self-pay | Admitting: Internal Medicine

## 2023-08-04 ENCOUNTER — Ambulatory Visit: Payer: Medicare Other | Admitting: Dermatology

## 2023-08-04 ENCOUNTER — Encounter: Payer: Self-pay | Admitting: Dermatology

## 2023-08-04 DIAGNOSIS — Z86006 Personal history of melanoma in-situ: Secondary | ICD-10-CM

## 2023-08-04 DIAGNOSIS — L578 Other skin changes due to chronic exposure to nonionizing radiation: Secondary | ICD-10-CM | POA: Diagnosis not present

## 2023-08-04 DIAGNOSIS — L814 Other melanin hyperpigmentation: Secondary | ICD-10-CM

## 2023-08-04 DIAGNOSIS — L820 Inflamed seborrheic keratosis: Secondary | ICD-10-CM | POA: Diagnosis not present

## 2023-08-04 DIAGNOSIS — L821 Other seborrheic keratosis: Secondary | ICD-10-CM | POA: Diagnosis not present

## 2023-08-04 DIAGNOSIS — Z85820 Personal history of malignant melanoma of skin: Secondary | ICD-10-CM | POA: Diagnosis not present

## 2023-08-04 DIAGNOSIS — Z86018 Personal history of other benign neoplasm: Secondary | ICD-10-CM

## 2023-08-04 DIAGNOSIS — L570 Actinic keratosis: Secondary | ICD-10-CM

## 2023-08-04 DIAGNOSIS — W908XXA Exposure to other nonionizing radiation, initial encounter: Secondary | ICD-10-CM | POA: Diagnosis not present

## 2023-08-04 DIAGNOSIS — Z1283 Encounter for screening for malignant neoplasm of skin: Secondary | ICD-10-CM | POA: Diagnosis not present

## 2023-08-04 DIAGNOSIS — D229 Melanocytic nevi, unspecified: Secondary | ICD-10-CM

## 2023-08-04 DIAGNOSIS — D1801 Hemangioma of skin and subcutaneous tissue: Secondary | ICD-10-CM | POA: Diagnosis not present

## 2023-08-04 NOTE — Patient Instructions (Signed)

## 2023-08-04 NOTE — Progress Notes (Signed)
Follow-Up Visit   Subjective  Aaron Valdez is a 77 y.o. male who presents for the following: Skin Cancer Screening and Full Body Skin Exam  The patient presents for Total-Body Skin Exam (TBSE) for skin cancer screening and mole check. The patient has spots, moles and lesions to be evaluated, some may be new or changing and the patient may have concern these could be cancer.  Patient with hx of dysplastic nevi, MMis and MM. Patient does have a spot at right temple that he picks at and bleeds.  The following portions of the chart were reviewed this encounter and updated as appropriate: medications, allergies, medical history  Review of Systems:  No other skin or systemic complaints except as noted in HPI or Assessment and Plan.  Objective  Well appearing patient in no apparent distress; mood and affect are within normal limits.  A full examination was performed including scalp, head, eyes, ears, nose, lips, neck, chest, axillae, abdomen, back, buttocks, bilateral upper extremities, bilateral lower extremities, hands, feet, fingers, toes, fingernails, and toenails. All findings within normal limits unless otherwise noted below.   Relevant physical exam findings are noted in the Assessment and Plan.  Right Temple Erythematous stuck-on, waxy papule or plaque  Vertex scalp x 1, L eyebrow x 1 (2) Erythematous thin papules/macules with gritty scale.     Assessment & Plan   SKIN CANCER SCREENING PERFORMED TODAY.  ACTINIC DAMAGE - Chronic condition, secondary to cumulative UV/sun exposure - diffuse scaly erythematous macules with underlying dyspigmentation - Recommend daily broad spectrum sunscreen SPF 30+ to sun-exposed areas, reapply every 2 hours as needed.  - Staying in the shade or wearing long sleeves, sun glasses (UVA+UVB protection) and wide brim hats (4-inch brim around the entire circumference of the hat) are also recommended for sun protection.  - Call for new or changing  lesions.  LENTIGINES, SEBORRHEIC KERATOSES, HEMANGIOMAS - Benign normal skin lesions - Benign-appearing - Call for any changes  MELANOCYTIC NEVI - Tan-brown and/or pink-flesh-colored symmetric macules and papules - Benign appearing on exam today - Observation - Call clinic for new or changing moles - Recommend daily use of broad spectrum spf 30+ sunscreen to sun-exposed areas.   History of Dysplastic Nevi - No evidence of recurrence today - Recommend regular full body skin exams - Recommend daily broad spectrum sunscreen SPF 30+ to sun-exposed areas, reapply every 2 hours as needed.  - Call if any new or changing lesions are noted between office visits  HISTORY OF MELANOMA - Right forearm. Superficial spreading. Breslow's 0.44mm,   Clark's level III. Excised 08/06/2022, margins free  - No evidence of recurrence today - No lymphadenopathy weight loss intractable headaches - Recommend regular full body skin exams - Recommend daily broad spectrum sunscreen SPF 30+ to sun-exposed areas, reapply every 2 hours as needed.  - Call if any new or changing lesions are noted between office visits  HISTORY OF MELANOMA IN SITU - Right calf. Excised 08/06/2022, margins free.  - No evidence of recurrence today - Recommend regular full body skin exams - Recommend daily broad spectrum sunscreen SPF 30+ to sun-exposed areas, reapply every 2 hours as needed.  - Call if any new or changing lesions are noted between office visits   Inflamed seborrheic keratosis Right Temple  Symptomatic, irritating, patient would like treated.  Benign-appearing.  Call clinic for new or changing lesions.    Destruction of lesion - Right Temple Complexity: simple   Destruction method: cryotherapy   Informed  consent: discussed and consent obtained   Timeout:  patient name, date of birth, surgical site, and procedure verified Lesion destroyed using liquid nitrogen: Yes   Region frozen until ice ball extended  beyond lesion: Yes   Cryo cycles: 1 or 2. Outcome: patient tolerated procedure well with no complications   Post-procedure details: wound care instructions given    AK (actinic keratosis) (2) Vertex scalp x 1, L eyebrow x 1  Actinic keratoses are precancerous spots that appear secondary to cumulative UV radiation exposure/sun exposure over time. They are chronic with expected duration over 1 year. A portion of actinic keratoses will progress to squamous cell carcinoma of the skin. It is not possible to reliably predict which spots will progress to skin cancer and so treatment is recommended to prevent development of skin cancer.  Recommend daily broad spectrum sunscreen SPF 30+ to sun-exposed areas, reapply every 2 hours as needed.  Recommend staying in the shade or wearing long sleeves, sun glasses (UVA+UVB protection) and wide brim hats (4-inch brim around the entire circumference of the hat). Call for new or changing lesions.   Destruction of lesion - Vertex scalp x 1, L eyebrow x 1 (2) Complexity: simple   Destruction method: cryotherapy   Informed consent: discussed and consent obtained   Timeout:  patient name, date of birth, surgical site, and procedure verified Lesion destroyed using liquid nitrogen: Yes   Region frozen until ice ball extended beyond lesion: Yes   Cryo cycles: 1 or 2. Outcome: patient tolerated procedure well with no complications   Post-procedure details: wound care instructions given     Return in about 3 months (around 11/04/2023) for TBSE, with Dr. Katrinka Blazing, Hx MMis, Hx MM, Hx Dysplastic Nevi.  Anise Salvo, RMA, am acting as scribe for Elie Goody, MD .   Documentation: I have reviewed the above documentation for accuracy and completeness, and I agree with the above.  Elie Goody, MD

## 2023-08-13 ENCOUNTER — Other Ambulatory Visit: Payer: Self-pay | Admitting: Internal Medicine

## 2023-08-13 DIAGNOSIS — E782 Mixed hyperlipidemia: Secondary | ICD-10-CM

## 2023-08-19 ENCOUNTER — Other Ambulatory Visit: Payer: Medicare Other

## 2023-08-19 ENCOUNTER — Other Ambulatory Visit: Payer: Self-pay

## 2023-08-19 DIAGNOSIS — E119 Type 2 diabetes mellitus without complications: Secondary | ICD-10-CM

## 2023-08-19 DIAGNOSIS — E7800 Pure hypercholesterolemia, unspecified: Secondary | ICD-10-CM | POA: Diagnosis not present

## 2023-08-20 LAB — HEPATIC FUNCTION PANEL
ALT: 16 [IU]/L (ref 0–44)
AST: 18 [IU]/L (ref 0–40)
Albumin: 4.2 g/dL (ref 3.8–4.8)
Alkaline Phosphatase: 58 [IU]/L (ref 44–121)
Bilirubin Total: 0.4 mg/dL (ref 0.0–1.2)
Bilirubin, Direct: 0.15 mg/dL (ref 0.00–0.40)
Total Protein: 6.3 g/dL (ref 6.0–8.5)

## 2023-08-20 LAB — LIPID PANEL
Chol/HDL Ratio: 2.3 {ratio} (ref 0.0–5.0)
Cholesterol, Total: 190 mg/dL (ref 100–199)
HDL: 84 mg/dL (ref >39)
LDL Chol Calc (NIH): 89 mg/dL (ref 0–99)
Triglycerides: 95 mg/dL (ref 0–149)
VLDL Cholesterol Cal: 17 mg/dL (ref 5–40)

## 2023-08-20 LAB — HEMOGLOBIN A1C
Est. average glucose Bld gHb Est-mCnc: 134 mg/dL
Hgb A1c MFr Bld: 6.3 % — ABNORMAL HIGH (ref 4.8–5.6)

## 2023-08-25 ENCOUNTER — Other Ambulatory Visit: Payer: Self-pay | Admitting: Internal Medicine

## 2023-08-25 ENCOUNTER — Encounter: Payer: Self-pay | Admitting: Internal Medicine

## 2023-08-25 ENCOUNTER — Ambulatory Visit (INDEPENDENT_AMBULATORY_CARE_PROVIDER_SITE_OTHER): Payer: Medicare Other | Admitting: Internal Medicine

## 2023-08-25 VITALS — BP 120/76 | HR 96 | Ht 68.0 in | Wt 271.4 lb

## 2023-08-25 DIAGNOSIS — E66813 Obesity, class 3: Secondary | ICD-10-CM

## 2023-08-25 DIAGNOSIS — E039 Hypothyroidism, unspecified: Secondary | ICD-10-CM | POA: Diagnosis not present

## 2023-08-25 DIAGNOSIS — Z23 Encounter for immunization: Secondary | ICD-10-CM | POA: Diagnosis not present

## 2023-08-25 DIAGNOSIS — N400 Enlarged prostate without lower urinary tract symptoms: Secondary | ICD-10-CM

## 2023-08-25 DIAGNOSIS — E782 Mixed hyperlipidemia: Secondary | ICD-10-CM

## 2023-08-25 DIAGNOSIS — Z6841 Body Mass Index (BMI) 40.0 and over, adult: Secondary | ICD-10-CM

## 2023-08-25 DIAGNOSIS — E119 Type 2 diabetes mellitus without complications: Secondary | ICD-10-CM | POA: Diagnosis not present

## 2023-08-25 LAB — POCT CBG (FASTING - GLUCOSE)-MANUAL ENTRY: Glucose Fasting, POC: 121 mg/dL — AB (ref 70–99)

## 2023-08-25 MED ORDER — OZEMPIC (1 MG/DOSE) 4 MG/3ML ~~LOC~~ SOPN: 1.0000 mg | PEN_INJECTOR | SUBCUTANEOUS | 2 refills | Status: DC

## 2023-08-25 NOTE — Progress Notes (Signed)
Established Patient Office Visit  Subjective:  Patient ID: Aaron Valdez, male    DOB: 06-15-1946  Age: 77 y.o. MRN: [id number]  Chief Complaint  Patient presents with   Follow-up    3 month follow up, discuss lab results.    No new complaints, here for lab review and medication refills. Labs reviewed and notable for well controlled diabetes, A1c at target, lipids at target with unremarkable cmp. Denies any hypoglycemic episodes and home bg readings have been at target.   No other concerns at this time.   Past Medical History:  Diagnosis Date   Aortic aneurysm (HCC)    possible   Arthritis    "ALL OVER"   Chronic kidney disease    RENAL INSUFF   Coronary artery disease    Diabetes mellitus (HCC) 10/14/2016   Dysplastic nevus 10/31/2022   Left upper arm. Severe atypia, peripheral margin involved. Excision 11/27/22   Dysplastic nevus 10/31/2022   Right flank. Moderate atypia with scar and persistent nevus like changes. Margins free.   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 Superficial spreading. Breslow'Icker Swigert 0.80mm, Clark'Jabron Weese level III. 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 Excised 08/06/2022, margins free.   Neuropathy    Obesity 10/14/2016    Past Surgical History:  Procedure Laterality Date   CATARACT EXTRACTION W/PHACO Right 05/20/2017   Procedure: CATARACT EXTRACTION PHACO AND INTRAOCULAR LENS PLACEMENT (IOC);  Surgeon: Galen Manila, MD;  Location: [facility];  Service: Ophthalmology;  Laterality: Right;  [place] 01:03 AP% 19.9 CDE 12.53 Fluid pack lot # [id number]   CATARACT EXTRACTION W/PHACO Left 09/01/2018   Procedure: CATARACT EXTRACTION PHACO AND INTRAOCULAR LENS PLACEMENT (IOC);  Surgeon: Galen Manila, MD;  Location: [facility];  Service: Ophthalmology;   Laterality: Left;  [place] 01:22.3 CDE 13.36 Fluid pack Lot # [id number]   COLONOSCOPY N/A 11/22/2020   Procedure: COLONOSCOPY;  Surgeon: Toledo, Boykin Nearing, MD;  Location: [facility];  Service: Gastroenterology;  Laterality: N/A;   COLONOSCOPY WITH PROPOFOL N/A 10/17/2016   Procedure: COLONOSCOPY WITH PROPOFOL;  Surgeon: Midge Minium, MD;  Location: [facility];  Service: Endoscopy;  Laterality: N/A;   CORONARY ANGIOPLASTY     FRACTURE SURGERY Left    elbow   HIATAL HERNIA REPAIR     JOINT REPLACEMENT     NASAL SINUS SURGERY     POLYPECTOMY  10/17/2016   Procedure: POLYPECTOMY;  Surgeon: Midge Minium, MD;  Location: [facility];  Service: Endoscopy;;   TONSILLECTOMY     TOTAL KNEE ARTHROPLASTY Right 12/18/2020   Procedure: TOTAL KNEE ARTHROPLASTY;  Surgeon: Lyndle Herrlich, MD;  Location: [facility];  Service: Orthopedics;  Laterality: Right;   UMBILICAL HERNIA REPAIR      Social History   Socioeconomic History   Marital status: Married    Spouse name: Not on file   Number of children: Not on file   Years of education: Not on file   Highest education level: Not on file  Occupational History   Not on file  Tobacco Use   Smoking status: Former    Types: Cigarettes   Smokeless tobacco: Former  Advertising account planner   Vaping status: Never Used  Substance and Sexual Activity   Alcohol use: No  Drug use: No   Sexual activity: Not on file  Other Topics Concern   Not on file  Social History Narrative   Not on file   Social Determinants of Health   Financial Resource Strain: Not on file  Food Insecurity: Not on file  Transportation Needs: Not on file  Physical Activity: Not on file  Stress: Not on file  Social Connections: Not on file  Intimate Partner Violence: Not on file    Family History  Problem Relation Age of Onset   Heart disease Father    AAA (abdominal aortic aneurysm) Father    Cerebral aneurysm Brother    Stomach cancer Other     Allergies  Allergen  Reactions   Tape Rash    Outpatient Medications Prior to Visit  Medication Sig   acetaminophen (TYLENOL) 500 MG tablet Take 1,500 mg by mouth every 6 (six) hours as needed for moderate pain or mild pain.   aspirin 81 MG chewable tablet Chew 1 tablet (81 mg total) by mouth 2 (two) times daily.   empagliflozin (JARDIANCE) 25 MG TABS tablet Take 25 mg by mouth daily.   hydrocortisone (ANUSOL-HC) 2.5 % rectal cream APPLY RECTALLY TO THE AFFECTED AREA AFTER EACH BOWEL MOVEMENT   Multiple Vitamin (MULTIVITAMIN) tablet Take 1 tablet by mouth daily.   Probiotic Product (PROBIOTIC ACIDOPHILUS BEADS PO) Take 1 capsule by mouth daily.   ramipril (ALTACE) 10 MG capsule TAKE 1 CAPSULE BY MOUTH EVERY MORNING   rosuvastatin (CRESTOR) 10 MG tablet TAKE 1 TABLET(10 MG) BY MOUTH DAILY   tiZANidine (ZANAFLEX) 2 MG tablet TAKE 1 TABLET BY MOUTH EVERY DAY   Vitamin D, Cholecalciferol, 25 MCG (1000 UT) TABS Take 1,000 Units by mouth daily.   [DISCONTINUED] OZEMPIC, 1 MG/DOSE, 4 MG/3ML SOPN Inject 1 mg into the skin once a week.   [DISCONTINUED] pioglitazone-metformin (ACTOPLUS MET) 15-850 MG tablet Take 1 tablet by mouth 3 (three) times daily.   Carboxymethylcellulose Sodium (LUBRICANT EYE DROPS OP) Place 2 drops into both eyes daily as needed (for dry eyes). (Patient not taking: Reported on 08/25/2023)   mupirocin ointment (BACTROBAN) 2 % Apply 1 Application topically daily. (Patient not taking: Reported on 02/10/2023)   No facility-administered medications prior to visit.    Review of Systems  Constitutional:  Negative for weight loss (gained 5 lbs).  HENT: Negative.    Eyes: Negative.   Respiratory: Negative.    Cardiovascular: Negative.   Gastrointestinal: Negative.   Genitourinary: Negative.   Skin: Negative.   Neurological: Negative.   Endo/Heme/Allergies: Negative.        Objective:   BP 120/76   Pulse 96   Ht 5\' 8"  (1.727 m)   Wt 271 lb 6.4 oz (123.1 kg)   SpO2 95%   BMI 41.27 kg/m    Vitals:   08/25/23 1257  BP: 120/76  Pulse: 96  Height: 5\' 8"  (1.727 m)  Weight: 271 lb 6.4 oz (123.1 kg)  SpO2: 95%  BMI (Calculated): 41.28    Physical Exam Vitals reviewed.  Constitutional:      Appearance: Normal appearance. He is obese.  HENT:     Head: Normocephalic.     Left Ear: There is no impacted cerumen.     Nose: Nose normal.     Mouth/Throat:     Mouth: Mucous membranes are moist.     Pharynx: No posterior oropharyngeal erythema.  Eyes:     Extraocular Movements: Extraocular movements intact.     Pupils: Pupils  are equal, round, and reactive to light.  Cardiovascular:     Rate and Rhythm: Regular rhythm.     Chest Wall: PMI is not displaced.     Pulses: Normal pulses.     Heart sounds: Normal heart sounds. No murmur heard. Pulmonary:     Effort: Pulmonary effort is normal.     Breath sounds: Normal air entry. No rhonchi or rales.  Abdominal:     General: Abdomen is flat. Bowel sounds are normal. There is no distension.     Palpations: Abdomen is soft. There is no hepatomegaly, splenomegaly or mass.     Tenderness: There is no abdominal tenderness.  Musculoskeletal:        General: Normal range of motion.     Cervical back: Normal range of motion and neck supple.     Right lower leg: No edema.     Left lower leg: No edema.  Skin:    General: Skin is warm and dry.     Comments: Lipoma on left upper back  Neurological:     General: No focal deficit present.     Mental Status: He is alert and oriented to person, place, and time.     Cranial Nerves: No cranial nerve deficit.     Motor: No weakness.  Psychiatric:        Mood and Affect: Mood normal.        Behavior: Behavior normal.      Results for orders placed or performed in visit on 08/25/23  POCT CBG (Fasting - Glucose)  Result Value Ref Range   Glucose Fasting, POC 121 (A) 70 - 99 mg/dL    Recent Results (from the past 2160 hour(Cleotilde Spadaccini))  Hemoglobin A1c     Status: Abnormal   Collection  Time: 08/19/23  9:39 AM  Result Value Ref Range   Hgb A1c MFr Bld 6.3 (H) 4.8 - 5.6 %    Comment:          Prediabetes: 5.7 - 6.4          Diabetes: >6.4          Glycemic control for adults with diabetes: <7.0    Est. average glucose Bld gHb Est-mCnc 134 mg/dL  Hepatic function panel     Status: None   Collection Time: 08/19/23  9:39 AM  Result Value Ref Range   Total Protein 6.3 6.0 - 8.5 g/dL   Albumin 4.2 3.8 - 4.8 g/dL   Bilirubin Total 0.4 0.0 - 1.2 mg/dL   Bilirubin, Direct 1.61 0.00 - 0.40 mg/dL   Alkaline Phosphatase 58 44 - 121 IU/L   AST 18 0 - 40 IU/L   ALT 16 0 - 44 IU/L  Lipid panel     Status: None   Collection Time: 08/19/23  9:39 AM  Result Value Ref Range   Cholesterol, Total 190 100 - 199 mg/dL   Triglycerides 95 0 - 149 mg/dL   HDL 84 >09 mg/dL   VLDL Cholesterol Cal 17 5 - 40 mg/dL   LDL Chol Calc (NIH) 89 0 - 99 mg/dL   Chol/HDL Ratio 2.3 0.0 - 5.0 ratio    Comment:                                   T. Chol/HDL Ratio  Men  Women                               1/2 Avg.Risk  3.4    3.3                                   Avg.Risk  5.0    4.4                                2X Avg.Risk  9.6    7.1                                3X Avg.Risk 23.4   11.0   POCT CBG (Fasting - Glucose)     Status: Abnormal   Collection Time: 08/25/23  1:05 PM  Result Value Ref Range   Glucose Fasting, POC 121 (A) 70 - 99 mg/dL      Assessment & Plan:  As per problem list  Problem List Items Addressed This Visit       Endocrine   Diabetes mellitus (HCC) - Primary   Relevant Medications   empagliflozin (JARDIANCE) 25 MG TABS tablet   OZEMPIC, 1 MG/DOSE, 4 MG/3ML SOPN   Other Relevant Orders   POCT CBG (Fasting - Glucose) (Completed)   Hemoglobin A1c   Acquired hypothyroidism     Other   Hyperlipidemia   Relevant Orders   Flu Vaccine Trivalent High Dose (Fluad)   Lipid panel   Comprehensive metabolic panel   Obesity    Relevant Medications   empagliflozin (JARDIANCE) 25 MG TABS tablet   OZEMPIC, 1 MG/DOSE, 4 MG/3ML SOPN   Other Relevant Orders   CBC With Diff/Platelet   Other Visit Diagnoses     Needs flu shot       Relevant Orders   Flu Vaccine Trivalent High Dose (Fluad)   Benign prostatic hyperplasia without lower urinary tract symptoms       Relevant Orders   PSA       Return in about 3 months (around 11/23/2023) for awv with labs prior.   Total time spent: 30 minutes  Luna Fuse, MD  08/25/2023   This document may have been prepared by [facility] Voice Recognition software and as such may include unintentional dictation errors.

## 2023-09-29 ENCOUNTER — Other Ambulatory Visit: Payer: Self-pay | Admitting: Family

## 2023-09-29 ENCOUNTER — Other Ambulatory Visit: Payer: Self-pay | Admitting: Internal Medicine

## 2023-11-11 ENCOUNTER — Ambulatory Visit: Payer: Medicare Other | Admitting: Dermatology

## 2023-11-11 ENCOUNTER — Encounter: Payer: Self-pay | Admitting: Dermatology

## 2023-11-11 DIAGNOSIS — Z1283 Encounter for screening for malignant neoplasm of skin: Secondary | ICD-10-CM | POA: Diagnosis not present

## 2023-11-11 DIAGNOSIS — W908XXA Exposure to other nonionizing radiation, initial encounter: Secondary | ICD-10-CM | POA: Diagnosis not present

## 2023-11-11 DIAGNOSIS — L814 Other melanin hyperpigmentation: Secondary | ICD-10-CM

## 2023-11-11 DIAGNOSIS — L570 Actinic keratosis: Secondary | ICD-10-CM | POA: Diagnosis not present

## 2023-11-11 DIAGNOSIS — R590 Localized enlarged lymph nodes: Secondary | ICD-10-CM

## 2023-11-11 DIAGNOSIS — D1801 Hemangioma of skin and subcutaneous tissue: Secondary | ICD-10-CM | POA: Diagnosis not present

## 2023-11-11 DIAGNOSIS — L821 Other seborrheic keratosis: Secondary | ICD-10-CM

## 2023-11-11 DIAGNOSIS — L578 Other skin changes due to chronic exposure to nonionizing radiation: Secondary | ICD-10-CM

## 2023-11-11 DIAGNOSIS — Z86006 Personal history of melanoma in-situ: Secondary | ICD-10-CM | POA: Diagnosis not present

## 2023-11-11 DIAGNOSIS — Z85820 Personal history of malignant melanoma of skin: Secondary | ICD-10-CM | POA: Diagnosis not present

## 2023-11-11 DIAGNOSIS — D239 Other benign neoplasm of skin, unspecified: Secondary | ICD-10-CM | POA: Diagnosis not present

## 2023-11-11 DIAGNOSIS — D229 Melanocytic nevi, unspecified: Secondary | ICD-10-CM

## 2023-11-11 NOTE — Patient Instructions (Signed)

## 2023-11-11 NOTE — Progress Notes (Signed)
Follow-Up Visit   Subjective  Aaron Valdez is a 78 y.o. male who presents for the following: Skin Cancer Screening and Full Body Skin Exam - Hx MM and MMIS  The patient presents for Total-Body Skin Exam (TBSE) for skin cancer screening and mole check. The patient has spots, moles and lesions to be evaluated, some may be new or changing and the patient may have concern these could be cancer.  The following portions of the chart were reviewed this encounter and updated as appropriate: medications, allergies, medical history  Review of Systems:  No other skin or systemic complaints except as noted in HPI or Assessment and Plan.  Objective  Well appearing patient in no apparent distress; mood and affect are within normal limits.  A full examination was performed including scalp, head, eyes, ears, nose, lips, neck, chest, axillae, abdomen, back, buttocks, bilateral upper extremities, bilateral lower extremities, hands, feet, fingers, toes, fingernails, and toenails. All findings within normal limits unless otherwise noted below.   Relevant physical exam findings are noted in the Assessment and Plan. L mid cheek x 1 Erythematous thin papules/macules with gritty scale.   Assessment & Plan   SKIN CANCER SCREENING PERFORMED TODAY.  ACTINIC DAMAGE - Chronic condition, secondary to cumulative UV/sun exposure - diffuse scaly erythematous macules with underlying dyspigmentation - Recommend daily broad spectrum sunscreen SPF 30+ to sun-exposed areas, reapply every 2 hours as needed.  - Staying in the shade or wearing long sleeves, sun glasses (UVA+UVB protection) and wide brim hats (4-inch brim around the entire circumference of the hat) are also recommended for sun protection.  - Call for new or changing lesions.  LENTIGINES, SEBORRHEIC KERATOSES, HEMANGIOMAS - Benign normal skin lesions - Benign-appearing - Call for any changes  MELANOCYTIC NEVI - Tan-brown and/or pink-flesh-colored  symmetric macules and papules - Benign appearing on exam today - Observation - Call clinic for new or changing moles - Recommend daily use of broad spectrum spf 30+ sunscreen to sun-exposed areas.   HISTORY OF MELANOMA - Right forearm. Superficial spreading. Breslow's 0.27mm,   Clark's level III. Excised 08/06/2022, margins free  - No evidence of recurrence today - No lymphadenopathy weight loss intractable headaches - Recommend regular full body skin exams - Recommend daily broad spectrum sunscreen SPF 30+ to sun-exposed areas, reapply every 2 hours as needed.  - Call if any new or changing lesions are noted between office visits   HISTORY OF MELANOMA IN SITU - Right calf. Excised 08/06/2022, margins free.  - No evidence of recurrence today - Recommend regular full body skin exams - Recommend daily broad spectrum sunscreen SPF 30+ to sun-exposed areas, reapply every 2 hours as needed.  - Call if any new or changing lesions are noted between office visits  DERMATOFIBROMA Exam: Firm pink/brown papulenodule with dimple sign. Treatment Plan: A dermatofibroma is a benign growth possibly related to trauma, such as an insect bite, cut from shaving, or inflamed acne-type bump.  Treatment options to remove include shave or excision with resulting scar and risk of recurrence.  Since benign-appearing and not bothersome, will observe for now.   AK (ACTINIC KERATOSIS) L mid cheek x 1 Actinic keratoses are precancerous spots that appear secondary to cumulative UV radiation exposure/sun exposure over time. They are chronic with expected duration over 1 year. A portion of actinic keratoses will progress to squamous cell carcinoma of the skin. It is not possible to reliably predict which spots will progress to skin cancer and so treatment is  recommended to prevent development of skin cancer.  Recommend daily broad spectrum sunscreen SPF 30+ to sun-exposed areas, reapply every 2 hours as needed.   Recommend staying in the shade or wearing long sleeves, sun glasses (UVA+UVB protection) and wide brim hats (4-inch brim around the entire circumference of the hat). Call for new or changing lesions.  Destruction of lesion - L mid cheek x 1 Complexity: simple   Destruction method: cryotherapy   Informed consent: discussed and consent obtained   Timeout:  patient name, date of birth, surgical site, and procedure verified Lesion destroyed using liquid nitrogen: Yes   Region frozen until ice ball extended beyond lesion: Yes   Outcome: patient tolerated procedure well with no complications   Post-procedure details: wound care instructions given   MULTIPLE BENIGN NEVI   LENTIGINES   ACTINIC ELASTOSIS   SEBORRHEIC KERATOSES   DERMATOFIBROMA   CHERRY ANGIOMA    Mild lymphadenopathy of the R neck - pt states issues with tonsils, not concerning at this time, will recheck at follow up.  Return in about 6 months (around 05/10/2024) for TBSE - hx of MMIS, MM.  I, Cari Caraway, CMA, am acting as scribe for Elie Goody, MD .  Documentation: I have reviewed the above documentation for accuracy and completeness, and I agree with the above.  Elie Goody, MD

## 2023-11-24 ENCOUNTER — Other Ambulatory Visit: Payer: Self-pay | Admitting: Internal Medicine

## 2023-11-24 DIAGNOSIS — E119 Type 2 diabetes mellitus without complications: Secondary | ICD-10-CM

## 2023-12-01 ENCOUNTER — Other Ambulatory Visit

## 2023-12-01 DIAGNOSIS — E782 Mixed hyperlipidemia: Secondary | ICD-10-CM | POA: Diagnosis not present

## 2023-12-01 DIAGNOSIS — E119 Type 2 diabetes mellitus without complications: Secondary | ICD-10-CM

## 2023-12-01 DIAGNOSIS — E66813 Obesity, class 3: Secondary | ICD-10-CM

## 2023-12-01 DIAGNOSIS — N400 Enlarged prostate without lower urinary tract symptoms: Secondary | ICD-10-CM

## 2023-12-02 LAB — HEMOGLOBIN A1C
Est. average glucose Bld gHb Est-mCnc: 140 mg/dL
Hgb A1c MFr Bld: 6.5 % — ABNORMAL HIGH (ref 4.8–5.6)

## 2023-12-02 LAB — CBC WITH DIFF/PLATELET
Basophils Absolute: 0 10*3/uL (ref 0.0–0.2)
Basos: 1 %
EOS (ABSOLUTE): 0.2 10*3/uL (ref 0.0–0.4)
Eos: 2 %
Hematocrit: 45.9 % (ref 37.5–51.0)
Hemoglobin: 14.7 g/dL (ref 13.0–17.7)
Immature Grans (Abs): 0 10*3/uL (ref 0.0–0.1)
Immature Granulocytes: 0 %
Lymphocytes Absolute: 2.3 10*3/uL (ref 0.7–3.1)
Lymphs: 30 %
MCH: 30.4 pg (ref 26.6–33.0)
MCHC: 32 g/dL (ref 31.5–35.7)
MCV: 95 fL (ref 79–97)
Monocytes Absolute: 0.7 10*3/uL (ref 0.1–0.9)
Monocytes: 9 %
Neutrophils Absolute: 4.5 10*3/uL (ref 1.4–7.0)
Neutrophils: 58 %
Platelets: 252 10*3/uL (ref 150–450)
RBC: 4.84 x10E6/uL (ref 4.14–5.80)
RDW: 13.1 % (ref 11.6–15.4)
WBC: 7.7 10*3/uL (ref 3.4–10.8)

## 2023-12-02 LAB — COMPREHENSIVE METABOLIC PANEL
ALT: 18 IU/L (ref 0–44)
AST: 19 IU/L (ref 0–40)
Albumin: 4.3 g/dL (ref 3.8–4.8)
Alkaline Phosphatase: 63 IU/L (ref 44–121)
BUN/Creatinine Ratio: 13 (ref 10–24)
BUN: 14 mg/dL (ref 8–27)
Bilirubin Total: 0.4 mg/dL (ref 0.0–1.2)
CO2: 24 mmol/L (ref 20–29)
Calcium: 9.6 mg/dL (ref 8.6–10.2)
Chloride: 102 mmol/L (ref 96–106)
Creatinine, Ser: 1.1 mg/dL (ref 0.76–1.27)
Globulin, Total: 2.1 g/dL (ref 1.5–4.5)
Glucose: 121 mg/dL — ABNORMAL HIGH (ref 70–99)
Potassium: 4.1 mmol/L (ref 3.5–5.2)
Sodium: 141 mmol/L (ref 134–144)
Total Protein: 6.4 g/dL (ref 6.0–8.5)
eGFR: 68 mL/min/{1.73_m2} (ref >59)

## 2023-12-02 LAB — LIPID PANEL
Chol/HDL Ratio: 2.6 ratio (ref 0.0–5.0)
Cholesterol, Total: 200 mg/dL — ABNORMAL HIGH (ref 100–199)
HDL: 78 mg/dL (ref >39)
LDL Chol Calc (NIH): 103 mg/dL — ABNORMAL HIGH (ref 0–99)
Triglycerides: 108 mg/dL (ref 0–149)
VLDL Cholesterol Cal: 19 mg/dL (ref 5–40)

## 2023-12-02 LAB — PSA: Prostate Specific Ag, Serum: 2.4 ng/mL (ref 0.0–4.0)

## 2023-12-08 ENCOUNTER — Ambulatory Visit: Payer: Medicare Other | Admitting: Internal Medicine

## 2023-12-08 ENCOUNTER — Encounter: Payer: Self-pay | Admitting: Internal Medicine

## 2023-12-08 VITALS — BP 118/72 | HR 85 | Temp 98.3°F | Resp 94 | Ht 68.0 in | Wt 264.0 lb

## 2023-12-08 DIAGNOSIS — E119 Type 2 diabetes mellitus without complications: Secondary | ICD-10-CM

## 2023-12-08 DIAGNOSIS — I10 Essential (primary) hypertension: Secondary | ICD-10-CM | POA: Diagnosis not present

## 2023-12-08 DIAGNOSIS — E039 Hypothyroidism, unspecified: Secondary | ICD-10-CM

## 2023-12-08 DIAGNOSIS — Z1331 Encounter for screening for depression: Secondary | ICD-10-CM | POA: Diagnosis not present

## 2023-12-08 DIAGNOSIS — K645 Perianal venous thrombosis: Secondary | ICD-10-CM

## 2023-12-08 DIAGNOSIS — E7800 Pure hypercholesterolemia, unspecified: Secondary | ICD-10-CM | POA: Diagnosis not present

## 2023-12-08 DIAGNOSIS — Z0001 Encounter for general adult medical examination with abnormal findings: Secondary | ICD-10-CM | POA: Diagnosis not present

## 2023-12-08 LAB — POCT CBG (FASTING - GLUCOSE)-MANUAL ENTRY: Glucose Fasting, POC: 108 mg/dL — AB (ref 70–99)

## 2023-12-08 MED ORDER — ROSUVASTATIN CALCIUM 20 MG PO TABS: 20.0000 mg | ORAL_TABLET | Freq: Every day | ORAL | 1 refills | Status: DC

## 2023-12-08 MED ORDER — TIZANIDINE HCL 2 MG PO TABS: 2.0000 mg | ORAL_TABLET | Freq: Every day | ORAL | 0 refills | Status: DC

## 2023-12-08 MED ORDER — PIOGLITAZONE HCL-METFORMIN HCL 15-850 MG PO TABS
1.0000 | ORAL_TABLET | Freq: Three times a day (TID) | ORAL | 0 refills | Status: AC
Start: 2023-12-08 — End: ?

## 2023-12-08 MED ORDER — HYDROCORTISONE (PERIANAL) 2.5 % EX CREA: TOPICAL_CREAM | Freq: Three times a day (TID) | CUTANEOUS | 5 refills | Status: DC

## 2023-12-08 MED ORDER — ONETOUCH ULTRA TEST VI STRP: ORAL_STRIP | 3 refills | Status: DC

## 2023-12-08 NOTE — Progress Notes (Signed)
 Established Patient Office Visit  Subjective:  Patient ID: Aaron Valdez, male    DOB: 21-Jul-1946  Age: 78 y.o. MRN: [id number]  No chief complaint on file.   No new complaints, here for AWV refer to quality metrics and scanned documents.  No new complaints, here for lab review and medication refills. Labs reviewed and notable for well controlled diabetes, A1c at target, lipids not at target with unremarkable cmp. Denies any hypoglycemic episodes and home bg readings have been at target.      No other concerns at this time.   Past Medical History:  Diagnosis Date   Aortic aneurysm (HCC)    possible   Arthritis    "ALL OVER"   Chronic kidney disease    RENAL INSUFF   Coronary artery disease    Diabetes mellitus (HCC) 10/14/2016   Dysplastic nevus 10/31/2022   Left upper arm. Severe atypia, peripheral margin involved. Excision 11/27/22   Dysplastic nevus 10/31/2022   Right flank. Moderate atypia with scar and persistent nevus like changes. Margins free.   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 Superficial spreading. Breslow'Travarus Trudo 0.50mm, Clark'Sativa Gelles level III. 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 Excised 08/06/2022, margins free.   Neuropathy    Obesity 10/14/2016    Past Surgical History:  Procedure Laterality Date   CATARACT EXTRACTION W/PHACO Right 05/20/2017   Procedure: CATARACT EXTRACTION PHACO AND INTRAOCULAR LENS PLACEMENT (IOC);  Surgeon: Galen Manila, MD;  Location: [facility];  Service: Ophthalmology;  Laterality: Right;  [place] 01:03 AP% 19.9 CDE 12.53 Fluid pack lot # [id number]   CATARACT EXTRACTION W/PHACO Left 09/01/2018   Procedure: CATARACT EXTRACTION PHACO AND INTRAOCULAR LENS PLACEMENT (IOC);  Surgeon: Galen Manila, MD;  Location: [facility];   Service: Ophthalmology;  Laterality: Left;  [place] 01:22.3 CDE 13.36 Fluid pack Lot # [id number]   COLONOSCOPY N/A 11/22/2020   Procedure: COLONOSCOPY;  Surgeon: Toledo, Boykin Nearing, MD;  Location: [facility];  Service: Gastroenterology;  Laterality: N/A;   COLONOSCOPY WITH PROPOFOL N/A 10/17/2016   Procedure: COLONOSCOPY WITH PROPOFOL;  Surgeon: Midge Minium, MD;  Location: [facility];  Service: Endoscopy;  Laterality: N/A;   CORONARY ANGIOPLASTY     FRACTURE SURGERY Left    elbow   HIATAL HERNIA REPAIR     JOINT REPLACEMENT     NASAL SINUS SURGERY     POLYPECTOMY  10/17/2016   Procedure: POLYPECTOMY;  Surgeon: Midge Minium, MD;  Location: [facility];  Service: Endoscopy;;   TONSILLECTOMY     TOTAL KNEE ARTHROPLASTY Right 12/18/2020   Procedure: TOTAL KNEE ARTHROPLASTY;  Surgeon: Lyndle Herrlich, MD;  Location: [facility];  Service: Orthopedics;  Laterality: Right;   UMBILICAL HERNIA REPAIR      Social History   Socioeconomic History   Marital status: Married    Spouse name: Not on file   Number of children: Not on file   Years of education: Not on file   Highest education level: Not on file  Occupational History   Not on file  Tobacco Use   Smoking status: Former    Types: Cigarettes   Smokeless tobacco: Former  Advertising account planner   Vaping status: Never Used  Substance and Sexual Activity   Alcohol  use: No   Drug use: No   Sexual activity: Not on file  Other Topics Concern   Not on file  Social History Narrative   Not on file   Social Drivers of Health   Financial Resource Strain: Not on file  Food Insecurity: Not on file  Transportation Needs: Not on file  Physical Activity: Not on file  Stress: Not on file  Social Connections: Not on file  Intimate Partner Violence: Not on file    Family History  Problem Relation Age of Onset   Heart disease Father    AAA (abdominal aortic aneurysm) Father    Cerebral aneurysm Brother    Stomach cancer Other      Allergies  Allergen Reactions   Tape Rash    Outpatient Medications Prior to Visit  Medication Sig   acetaminophen (TYLENOL) 500 MG tablet Take 1,500 mg by mouth every 6 (six) hours as needed for moderate pain or mild pain.   aspirin 81 MG chewable tablet Chew 1 tablet (81 mg total) by mouth 2 (two) times daily.   Carboxymethylcellulose Sodium (LUBRICANT EYE DROPS OP) Place 2 drops into both eyes daily as needed (for dry eyes). (Patient not taking: Reported on 08/25/2023)   empagliflozin (JARDIANCE) 25 MG TABS tablet Take 25 mg by mouth daily.   JARDIANCE 25 MG TABS tablet TAKE 1 TABLET(25 MG) BY MOUTH DAILY   Multiple Vitamin (MULTIVITAMIN) tablet Take 1 tablet by mouth daily.   mupirocin ointment (BACTROBAN) 2 % Apply 1 Application topically daily. (Patient not taking: Reported on 11/11/2023)   OZEMPIC, 1 MG/DOSE, 4 MG/3ML SOPN INJECT 1 MG INTO SKIN 1 TIME A WEEK   Probiotic Product (PROBIOTIC ACIDOPHILUS BEADS PO) Take 1 capsule by mouth daily.   ramipril (ALTACE) 10 MG capsule TAKE 1 CAPSULE BY MOUTH EVERY MORNING   Vitamin D, Cholecalciferol, 25 MCG (1000 UT) TABS Take 1,000 Units by mouth daily.   [DISCONTINUED] hydrocortisone (ANUSOL-HC) 2.5 % rectal cream APPLY RECTALLY TO THE AFFECTED AREA AFTER EACH BOWEL MOVEMENT   [DISCONTINUED] pioglitazone-metformin (ACTOPLUS MET) 15-850 MG tablet TAKE 1 TABLET BY MOUTH THREE TIMES DAILY   [DISCONTINUED] rosuvastatin (CRESTOR) 10 MG tablet TAKE 1 TABLET(10 MG) BY MOUTH DAILY   [DISCONTINUED] tiZANidine (ZANAFLEX) 2 MG tablet TAKE 1 TABLET BY MOUTH EVERY DAY   No facility-administered medications prior to visit.    Review of Systems  Constitutional:  Negative for weight loss.  HENT: Negative.    Eyes: Negative.   Respiratory: Negative.    Cardiovascular: Negative.   Gastrointestinal: Negative.   Genitourinary: Negative.   Skin: Negative.   Neurological: Negative.   Endo/Heme/Allergies: Negative.        Objective:   BP  118/72   Pulse 85   Temp 98.3 F (36.8 C)   Resp (!) 94   Ht 5\' 8"  (1.727 m)   Wt 264 lb (119.7 kg)   BMI 40.14 kg/m   Vitals:   12/08/23 1311  BP: 118/72  Pulse: 85  Temp: 98.3 F (36.8 C)  Resp: (!) 94  Height: 5\' 8"  (1.727 m)  Weight: 264 lb (119.7 kg)  BMI (Calculated): 40.15    Physical Exam Vitals reviewed.  Constitutional:      Appearance: Normal appearance. He is obese.  HENT:     Head: Normocephalic.     Left Ear: There is no impacted cerumen.     Nose: Nose normal.     Mouth/Throat:     Mouth: Mucous membranes are  moist.     Pharynx: No posterior oropharyngeal erythema.  Eyes:     Extraocular Movements: Extraocular movements intact.     Pupils: Pupils are equal, round, and reactive to light.  Cardiovascular:     Rate and Rhythm: Regular rhythm.     Chest Wall: PMI is not displaced.     Pulses: Normal pulses.     Heart sounds: Normal heart sounds. No murmur heard. Pulmonary:     Effort: Pulmonary effort is normal.     Breath sounds: Normal air entry. No rhonchi or rales.  Abdominal:     General: Abdomen is flat. Bowel sounds are normal. There is no distension.     Palpations: Abdomen is soft. There is no hepatomegaly, splenomegaly or mass.     Tenderness: There is no abdominal tenderness.  Musculoskeletal:        General: Normal range of motion.     Cervical back: Normal range of motion and neck supple.     Right lower leg: No edema.     Left lower leg: No edema.  Skin:    General: Skin is warm and dry.     Comments: Lipoma on left upper back  Neurological:     General: No focal deficit present.     Mental Status: He is alert and oriented to person, place, and time.     Cranial Nerves: No cranial nerve deficit.     Motor: No weakness.  Psychiatric:        Mood and Affect: Mood normal.        Behavior: Behavior normal.      Results for orders placed or performed in visit on 12/08/23  POCT CBG (Fasting - Glucose)  Result Value Ref Range    Glucose Fasting, POC 108 (A) 70 - 99 mg/dL    Recent Results (from the past 2160 hours)  PSA     Status: None   Collection Time: 12/01/23  9:26 AM  Result Value Ref Range   Prostate Specific Ag, Serum 2.4 0.0 - 4.0 ng/mL    Comment: Roche ECLIA methodology. According to the American Urological Association, Serum PSA should decrease and remain at undetectable levels after radical prostatectomy. The AUA defines biochemical recurrence as an initial PSA value 0.2 ng/mL or greater followed by a subsequent confirmatory PSA value 0.2 ng/mL or greater. Values obtained with different assay methods or kits cannot be used interchangeably. Results cannot be interpreted as absolute evidence of the presence or absence of malignant disease.   CBC With Diff/Platelet     Status: None   Collection Time: 12/01/23  9:26 AM  Result Value Ref Range   WBC 7.7 3.4 - 10.8 x10E3/uL   RBC 4.84 4.14 - 5.80 x10E6/uL   Hemoglobin 14.7 13.0 - 17.7 g/dL   Hematocrit 64.4 03.4 - 51.0 %   MCV 95 79 - 97 fL   MCH 30.4 26.6 - 33.0 pg   MCHC 32.0 31.5 - 35.7 g/dL   RDW 74.2 59.5 - 63.8 %   Platelets 252 150 - 450 x10E3/uL   Neutrophils 58 Not Estab. %   Lymphs 30 Not Estab. %   Monocytes 9 Not Estab. %   Eos 2 Not Estab. %   Basos 1 Not Estab. %   Neutrophils Absolute 4.5 1.4 - 7.0 x10E3/uL   Lymphocytes Absolute 2.3 0.7 - 3.1 x10E3/uL   Monocytes Absolute 0.7 0.1 - 0.9 x10E3/uL   EOS (ABSOLUTE) 0.2 0.0 - 0.4 x10E3/uL  Basophils Absolute 0.0 0.0 - 0.2 x10E3/uL   Immature Granulocytes 0 Not Estab. %   Immature Grans (Abs) 0.0 0.0 - 0.1 x10E3/uL  Comprehensive metabolic panel     Status: Abnormal   Collection Time: 12/01/23  9:26 AM  Result Value Ref Range   Glucose 121 (H) 70 - 99 mg/dL   BUN 14 8 - 27 mg/dL   Creatinine, Ser 5.36 0.76 - 1.27 mg/dL   eGFR 68 >64 QI/HKV/4.25   BUN/Creatinine Ratio 13 10 - 24   Sodium 141 134 - 144 mmol/L   Potassium 4.1 3.5 - 5.2 mmol/L   Chloride 102 96 - 106 mmol/L    CO2 24 20 - 29 mmol/L   Calcium 9.6 8.6 - 10.2 mg/dL   Total Protein 6.4 6.0 - 8.5 g/dL   Albumin 4.3 3.8 - 4.8 g/dL   Globulin, Total 2.1 1.5 - 4.5 g/dL   Bilirubin Total 0.4 0.0 - 1.2 mg/dL   Alkaline Phosphatase 63 44 - 121 IU/L   AST 19 0 - 40 IU/L   ALT 18 0 - 44 IU/L  Lipid panel     Status: Abnormal   Collection Time: 12/01/23  9:26 AM  Result Value Ref Range   Cholesterol, Total 200 (H) 100 - 199 mg/dL   Triglycerides 956 0 - 149 mg/dL   HDL 78 >38 mg/dL   VLDL Cholesterol Cal 19 5 - 40 mg/dL   LDL Chol Calc (NIH) 756 (H) 0 - 99 mg/dL   Chol/HDL Ratio 2.6 0.0 - 5.0 ratio    Comment:                                   T. Chol/HDL Ratio                                             Men  Women                               1/2 Avg.Risk  3.4    3.3                                   Avg.Risk  5.0    4.4                                2X Avg.Risk  9.6    7.1                                3X Avg.Risk 23.4   11.0   Hemoglobin A1c     Status: Abnormal   Collection Time: 12/01/23  9:26 AM  Result Value Ref Range   Hgb A1c MFr Bld 6.5 (H) 4.8 - 5.6 %    Comment:          Prediabetes: 5.7 - 6.4          Diabetes: >6.4          Glycemic control for adults with diabetes: <7.0    Est. average glucose Bld gHb Est-mCnc 140 mg/dL  POCT CBG (Fasting - Glucose)     Status: Abnormal   Collection Time: 12/08/23  1:13 PM  Result Value Ref Range   Glucose Fasting, POC 108 (A) 70 - 99 mg/dL      Assessment & Plan:  As per problem list  Problem List Items Addressed This Visit       Cardiovascular and Mediastinum   Essential hypertension   Relevant Medications   rosuvastatin (CRESTOR) 20 MG tablet     Endocrine   Diabetes mellitus (HCC) - Primary   Relevant Medications   pioglitazone-metformin (ACTOPLUS MET) 15-850 MG tablet   rosuvastatin (CRESTOR) 20 MG tablet   Other Relevant Orders   POCT CBG (Fasting - Glucose) (Completed)   Acquired hypothyroidism     Other    Hyperlipidemia   Relevant Medications   rosuvastatin (CRESTOR) 20 MG tablet   Other Visit Diagnoses       Perianal venous thrombosis       Relevant Medications   hydrocortisone (ANUSOL-HC) 2.5 % rectal cream   rosuvastatin (CRESTOR) 20 MG tablet       Return in about 6 weeks (around 01/19/2024) for memory eval.   Total time spent: 30 minutes  Luna Fuse, MD  12/08/2023   This document may have been prepared by Dragon Voice Recognition software and as such may include unintentional dictation errors.

## 2024-01-20 ENCOUNTER — Ambulatory Visit: Admitting: Internal Medicine

## 2024-01-20 ENCOUNTER — Encounter: Payer: Self-pay | Admitting: Internal Medicine

## 2024-01-20 VITALS — BP 126/62 | HR 83 | Temp 99.5°F | Ht 68.0 in | Wt 266.0 lb

## 2024-01-20 DIAGNOSIS — G3184 Mild cognitive impairment, so stated: Secondary | ICD-10-CM | POA: Diagnosis not present

## 2024-01-20 DIAGNOSIS — E119 Type 2 diabetes mellitus without complications: Secondary | ICD-10-CM | POA: Diagnosis not present

## 2024-01-20 DIAGNOSIS — Z0130 Encounter for examination of blood pressure without abnormal findings: Secondary | ICD-10-CM

## 2024-01-20 LAB — POCT CBG (FASTING - GLUCOSE)-MANUAL ENTRY: Glucose Fasting, POC: 119 mg/dL — AB (ref 70–99)

## 2024-01-20 NOTE — Progress Notes (Signed)
 Established Patient Office Visit  Subjective:  Patient ID: Aaron Valdez, male    DOB: 03/27/1946  Age: 78 y.o. MRN: [id number]  Chief Complaint  Patient presents with   Follow-up    Memory Eval    Here for memory eval, refer to scanned documents for reference.    No other concerns at this time.   Past Medical History:  Diagnosis Date   Aortic aneurysm (HCC)    possible   Arthritis    "ALL OVER"   Chronic kidney disease    RENAL INSUFF   Coronary artery disease    Diabetes mellitus (HCC) 10/14/2016   Dysplastic nevus 10/31/2022   Left upper arm. Severe atypia, peripheral margin involved. Excision 11/27/22   Dysplastic nevus 10/31/2022   Right flank. Moderate atypia with scar and persistent nevus like changes. Margins free.   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 Superficial spreading. Breslow'Anniece Bleiler 0.35mm, Clark'Nels Munn level III. 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 Excised 08/06/2022, margins free.   Neuropathy    Obesity 10/14/2016    Past Surgical History:  Procedure Laterality Date   CATARACT EXTRACTION W/PHACO Right 05/20/2017   Procedure: CATARACT EXTRACTION PHACO AND INTRAOCULAR LENS PLACEMENT (IOC);  Surgeon: Clair Crews, MD;  Location: [facility];  Service: Ophthalmology;  Laterality: Right;  US  01:03 AP% 19.9 CDE 12.53 Fluid pack lot # [id number]   CATARACT EXTRACTION W/PHACO Left 09/01/2018   Procedure: CATARACT EXTRACTION PHACO AND INTRAOCULAR LENS PLACEMENT (IOC);  Surgeon: Clair Crews, MD;  Location: [facility];  Service: Ophthalmology;  Laterality: Left;  US  01:22.3 CDE 13.36 Fluid pack Lot # [id number]   COLONOSCOPY N/A 11/22/2020   Procedure: COLONOSCOPY;  Surgeon: Toledo, Alphonsus Jeans, MD;  Location: [facility];  Service: Gastroenterology;   Laterality: N/A;   COLONOSCOPY WITH PROPOFOL  N/A 10/17/2016   Procedure: COLONOSCOPY WITH PROPOFOL ;  Surgeon: Marnee Sink, MD;  Location: [facility];  Service: Endoscopy;  Laterality: N/A;   CORONARY ANGIOPLASTY     FRACTURE SURGERY Left    elbow   HIATAL HERNIA REPAIR     JOINT REPLACEMENT     NASAL SINUS SURGERY     POLYPECTOMY  10/17/2016   Procedure: POLYPECTOMY;  Surgeon: Marnee Sink, MD;  Location: [facility];  Service: Endoscopy;;   TONSILLECTOMY     TOTAL KNEE ARTHROPLASTY Right 12/18/2020   Procedure: TOTAL KNEE ARTHROPLASTY;  Surgeon: Jerlyn Moons, MD;  Location: [facility];  Service: Orthopedics;  Laterality: Right;   UMBILICAL HERNIA REPAIR      Social History   Socioeconomic History   Marital status: Married    Spouse name: Not on file   Number of children: Not on file   Years of education: Not on file   Highest education level: Not on file  Occupational History   Not on file  Tobacco Use   Smoking status: Former    Types: Cigarettes   Smokeless tobacco: Former  Advertising account planner   Vaping status: Never Used  Substance and Sexual Activity   Alcohol use: No   Drug use: No   Sexual activity: Not on file  Other Topics Concern   Not on file  Social History Narrative   Not on file   Social Drivers  of Health   Financial Resource Strain: Not on file  Food Insecurity: Not on file  Transportation Needs: Not on file  Physical Activity: Not on file  Stress: Not on file  Social Connections: Not on file  Intimate Partner Violence: Not on file    Family History  Problem Relation Age of Onset   Heart disease Father    AAA (abdominal aortic aneurysm) Father    Cerebral aneurysm Brother    Stomach cancer Other     Allergies  Allergen Reactions   Tape Rash    Outpatient Medications Prior to Visit  Medication Sig   acetaminophen  (TYLENOL ) 500 MG tablet Take 1,500 mg by mouth every 6 (six) hours as needed for moderate pain or mild pain.   aspirin   81 MG chewable tablet Chew 1 tablet (81 mg total) by mouth 2 (two) times daily.   Carboxymethylcellulose Sodium (LUBRICANT EYE DROPS OP) Place 2 drops into both eyes daily as needed (for dry eyes). (Patient not taking: Reported on 08/25/2023)   empagliflozin  (JARDIANCE ) 25 MG TABS tablet Take 25 mg by mouth daily.   glucose blood (ONETOUCH ULTRA TEST) test strip Test daily   hydrocortisone  (ANUSOL -HC) 2.5 % rectal cream Place rectally 3 (three) times daily.   JARDIANCE  25 MG TABS tablet TAKE 1 TABLET(25 MG) BY MOUTH DAILY   Multiple Vitamin (MULTIVITAMIN) tablet Take 1 tablet by mouth daily.   mupirocin  ointment (BACTROBAN ) 2 % Apply 1 Application topically daily. (Patient not taking: Reported on 11/11/2023)   OZEMPIC , 1 MG/DOSE, 4 MG/3ML SOPN INJECT 1 MG INTO SKIN 1 TIME A WEEK   pioglitazone -metformin  (ACTOPLUS MET ) 15-850 MG tablet Take 1 tablet by mouth 3 (three) times daily.   Probiotic Product (PROBIOTIC ACIDOPHILUS BEADS PO) Take 1 capsule by mouth daily.   ramipril  (ALTACE ) 10 MG capsule TAKE 1 CAPSULE BY MOUTH EVERY MORNING   rosuvastatin  (CRESTOR ) 20 MG tablet Take 1 tablet (20 mg total) by mouth daily.   tiZANidine  (ZANAFLEX ) 2 MG tablet Take 1 tablet (2 mg total) by mouth daily.   Vitamin D, Cholecalciferol, 25 MCG (1000 UT) TABS Take 1,000 Units by mouth daily.   No facility-administered medications prior to visit.    Review of Systems  Constitutional:  Negative for weight loss.  HENT: Negative.    Eyes: Negative.   Respiratory: Negative.    Cardiovascular: Negative.   Gastrointestinal: Negative.   Genitourinary: Negative.   Skin: Negative.   Neurological: Negative.   Endo/Heme/Allergies: Negative.        Objective:   BP 126/62   Pulse 83   Temp 99.5 F (37.5 C) (Tympanic)   Ht 5\' 8"  (1.727 m)   Wt 266 lb (120.7 kg)   SpO2 93%   BMI 40.45 kg/m   Vitals:   01/20/24 1309  BP: 126/62  Pulse: 83  Temp: 99.5 F (37.5 C)  Height: 5\' 8"  (1.727 m)  Weight: 266  lb (120.7 kg)  SpO2: 93%  TempSrc: Tympanic  BMI (Calculated): 40.45    Physical Exam Vitals reviewed.  Constitutional:      Appearance: Normal appearance. He is obese.  HENT:     Head: Normocephalic.     Left Ear: There is no impacted cerumen.     Nose: Nose normal.     Mouth/Throat:     Mouth: Mucous membranes are moist.     Pharynx: No posterior oropharyngeal erythema.  Eyes:     Extraocular Movements: Extraocular movements intact.     Pupils:  Pupils are equal, round, and reactive to light.  Cardiovascular:     Rate and Rhythm: Regular rhythm.     Chest Wall: PMI is not displaced.     Pulses: Normal pulses.     Heart sounds: Normal heart sounds. No murmur heard. Pulmonary:     Effort: Pulmonary effort is normal.     Breath sounds: Normal air entry. No rhonchi or rales.  Abdominal:     General: Abdomen is flat. Bowel sounds are normal. There is no distension.     Palpations: Abdomen is soft. There is no hepatomegaly, splenomegaly or mass.     Tenderness: There is no abdominal tenderness.  Musculoskeletal:        General: Normal range of motion.     Cervical back: Normal range of motion and neck supple.     Right lower leg: No edema.     Left lower leg: No edema.  Skin:    General: Skin is warm and dry.     Comments: Lipoma on left upper back  Neurological:     General: No focal deficit present.     Mental Status: He is alert and oriented to person, place, and time.     Cranial Nerves: No cranial nerve deficit.     Motor: No weakness.  Psychiatric:        Mood and Affect: Mood normal.        Behavior: Behavior normal.      Results for orders placed or performed in visit on 01/20/24  POCT CBG (Fasting - Glucose)  Result Value Ref Range   Glucose Fasting, POC 119 (A) 70 - 99 mg/dL        Assessment & Plan:  As per problem list  Problem List Items Addressed This Visit       Endocrine   Diabetes mellitus (HCC)   Relevant Orders   POCT CBG (Fasting -  Glucose) (Completed)   Other Visit Diagnoses       Mild cognitive impairment    -  Primary       Return in about 7 weeks (around 03/09/2024) for fu with labs prior.   Total time spent: 30 minutes  Arzella Bitters, MD  01/20/2024   This document may have been prepared by [facility] Voice Recognition software and as such may include unintentional dictation errors.

## 2024-02-18 ENCOUNTER — Other Ambulatory Visit: Payer: Self-pay | Admitting: Internal Medicine

## 2024-02-18 DIAGNOSIS — E119 Type 2 diabetes mellitus without complications: Secondary | ICD-10-CM

## 2024-02-18 DIAGNOSIS — E782 Mixed hyperlipidemia: Secondary | ICD-10-CM

## 2024-03-02 ENCOUNTER — Other Ambulatory Visit

## 2024-03-02 DIAGNOSIS — E119 Type 2 diabetes mellitus without complications: Secondary | ICD-10-CM | POA: Diagnosis not present

## 2024-03-02 DIAGNOSIS — E782 Mixed hyperlipidemia: Secondary | ICD-10-CM

## 2024-03-03 LAB — HEMOGLOBIN A1C
Est. average glucose Bld gHb Est-mCnc: 140 mg/dL
Hgb A1c MFr Bld: 6.5 % — ABNORMAL HIGH (ref 4.8–5.6)

## 2024-03-03 LAB — LIPID PANEL
Chol/HDL Ratio: 2.5 ratio (ref 0.0–5.0)
Cholesterol, Total: 185 mg/dL (ref 100–199)
HDL: 73 mg/dL
LDL Chol Calc (NIH): 88 mg/dL (ref 0–99)
Triglycerides: 141 mg/dL (ref 0–149)
VLDL Cholesterol Cal: 24 mg/dL (ref 5–40)

## 2024-03-09 ENCOUNTER — Ambulatory Visit (INDEPENDENT_AMBULATORY_CARE_PROVIDER_SITE_OTHER): Admitting: Internal Medicine

## 2024-03-09 ENCOUNTER — Encounter: Payer: Self-pay | Admitting: Internal Medicine

## 2024-03-09 ENCOUNTER — Ambulatory Visit: Payer: Self-pay | Admitting: Internal Medicine

## 2024-03-09 VITALS — BP 114/69 | HR 76 | Temp 98.9°F | Ht 68.0 in | Wt 271.0 lb

## 2024-03-09 DIAGNOSIS — I10 Essential (primary) hypertension: Secondary | ICD-10-CM

## 2024-03-09 DIAGNOSIS — K529 Noninfective gastroenteritis and colitis, unspecified: Secondary | ICD-10-CM | POA: Diagnosis not present

## 2024-03-09 DIAGNOSIS — E7800 Pure hypercholesterolemia, unspecified: Secondary | ICD-10-CM | POA: Diagnosis not present

## 2024-03-09 DIAGNOSIS — E782 Mixed hyperlipidemia: Secondary | ICD-10-CM

## 2024-03-09 DIAGNOSIS — E119 Type 2 diabetes mellitus without complications: Secondary | ICD-10-CM

## 2024-03-09 LAB — POC CREATINE & ALBUMIN,URINE
Creatinine, POC: 100 mg/dL
Microalbumin Ur, POC: 80 mg/L

## 2024-03-09 LAB — POCT CBG (FASTING - GLUCOSE)-MANUAL ENTRY: Glucose Fasting, POC: 100 mg/dL — AB (ref 70–99)

## 2024-03-09 MED ORDER — CHOLESTYRAMINE 4 G PO PACK: 1.0000 | PACK | Freq: Two times a day (BID) | ORAL | 11 refills | Status: AC

## 2024-03-09 MED ORDER — TIZANIDINE HCL 2 MG PO TABS: 2.0000 mg | ORAL_TABLET | Freq: Every day | ORAL | 0 refills | Status: DC

## 2024-03-09 MED ORDER — PIOGLITAZONE HCL-METFORMIN HCL 15-850 MG PO TABS
1.0000 | ORAL_TABLET | Freq: Three times a day (TID) | ORAL | 0 refills | Status: DC
Start: 2024-03-09 — End: 2024-06-28

## 2024-03-09 MED ORDER — RAMIPRIL 10 MG PO CAPS: 10.0000 mg | ORAL_CAPSULE | Freq: Every morning | ORAL | 1 refills | Status: DC

## 2024-03-09 MED ORDER — EMPAGLIFLOZIN 25 MG PO TABS
25.0000 mg | ORAL_TABLET | Freq: Every day | ORAL | 0 refills | Status: AC
Start: 2024-03-09 — End: 2024-06-07

## 2024-03-09 MED ORDER — ROSUVASTATIN CALCIUM 20 MG PO TABS: 20.0000 mg | ORAL_TABLET | Freq: Every day | ORAL | 1 refills | Status: DC

## 2024-03-09 NOTE — Progress Notes (Signed)
 Established Patient Office Visit  Subjective:  Patient ID: Aaron Valdez, male    DOB: 1945/10/28  Age: 78 y.o. MRN: [id number]  Chief Complaint  Patient presents with   Follow-up    7 week lab results    No new complaints, here for lab review and medication refills. Labs reviewed and notable for well controlled diabetes, A1c remains at target, lipids at target with unremarkable cmp. Denies any hypoglycemic episodes and home bg readings have been at target.     No other concerns at this time.   Past Medical History:  Diagnosis Date   Aortic aneurysm (HCC)    possible   Arthritis    ALL OVER   Chronic kidney disease    RENAL INSUFF   Coronary artery disease    Diabetes mellitus (HCC) 10/14/2016   Dysplastic nevus 10/31/2022   Left upper arm. Severe atypia, peripheral margin involved. Excision 11/27/22   Dysplastic nevus 10/31/2022   Right flank. Moderate atypia with scar and persistent nevus like changes. Margins free.   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 Superficial spreading. Breslow'Faraz Ponciano 0.55mm, Clark'Teonna Coonan level III. 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 Excised 08/06/2022, margins free.   Neuropathy    Obesity 10/14/2016    Past Surgical History:  Procedure Laterality Date   CATARACT EXTRACTION W/PHACO Right 05/20/2017   Procedure: CATARACT EXTRACTION PHACO AND INTRAOCULAR LENS PLACEMENT (IOC);  Surgeon: Clair Crews, MD;  Location: [facility];  Service: Ophthalmology;  Laterality: Right;  US  01:03 AP% 19.9 CDE 12.53 Fluid pack lot # [id number]   CATARACT EXTRACTION W/PHACO Left 09/01/2018   Procedure: CATARACT EXTRACTION PHACO AND INTRAOCULAR LENS PLACEMENT (IOC);  Surgeon: Clair Crews, MD;  Location: [facility];  Service: Ophthalmology;   Laterality: Left;  US  01:22.3 CDE 13.36 Fluid pack Lot # [id number]   COLONOSCOPY N/A 11/22/2020   Procedure: COLONOSCOPY;  Surgeon: Toledo, Alphonsus Jeans, MD;  Location: [facility];  Service: Gastroenterology;  Laterality: N/A;   COLONOSCOPY WITH PROPOFOL  N/A 10/17/2016   Procedure: COLONOSCOPY WITH PROPOFOL ;  Surgeon: Marnee Sink, MD;  Location: [facility];  Service: Endoscopy;  Laterality: N/A;   CORONARY ANGIOPLASTY     FRACTURE SURGERY Left    elbow   HIATAL HERNIA REPAIR     JOINT REPLACEMENT     NASAL SINUS SURGERY     POLYPECTOMY  10/17/2016   Procedure: POLYPECTOMY;  Surgeon: Marnee Sink, MD;  Location: [facility];  Service: Endoscopy;;   TONSILLECTOMY     TOTAL KNEE ARTHROPLASTY Right 12/18/2020   Procedure: TOTAL KNEE ARTHROPLASTY;  Surgeon: Jerlyn Moons, MD;  Location: [facility];  Service: Orthopedics;  Laterality: Right;   UMBILICAL HERNIA REPAIR      Social History   Socioeconomic History   Marital status: Married    Spouse name: Not on file   Number of children: Not on file   Years of education: Not on file   Highest education level: Not on file  Occupational History   Not on file  Tobacco Use   Smoking status: Former    Types: Cigarettes   Smokeless tobacco: Former  Advertising account planner   Vaping status: Never Used  Substance and Sexual Activity   Alcohol use: No  Drug use: No   Sexual activity: Not on file  Other Topics Concern   Not on file  Social History Narrative   Not on file   Social Drivers of Health   Financial Resource Strain: Not on file  Food Insecurity: Not on file  Transportation Needs: Not on file  Physical Activity: Not on file  Stress: Not on file  Social Connections: Not on file  Intimate Partner Violence: Not on file    Family History  Problem Relation Age of Onset   Heart disease Father    AAA (abdominal aortic aneurysm) Father    Cerebral aneurysm Brother    Stomach cancer Other     Allergies  Allergen Reactions    Tape Rash    Outpatient Medications Prior to Visit  Medication Sig   aspirin  81 MG chewable tablet Chew 1 tablet (81 mg total) by mouth 2 (two) times daily.   Carboxymethylcellulose Sodium (LUBRICANT EYE DROPS OP) Place 2 drops into both eyes daily as needed (for dry eyes).   glucose blood (ONETOUCH ULTRA TEST) test strip Test daily   hydrocortisone  (ANUSOL -HC) 2.5 % rectal cream Place rectally 3 (three) times daily.   loperamide (IMODIUM) 2 MG capsule Take 2 mg by mouth as needed for diarrhea or loose stools.   Multiple Vitamin (MULTIVITAMIN) tablet Take 1 tablet by mouth daily.   OZEMPIC , 1 MG/DOSE, 4 MG/3ML SOPN INJECT 1 MG UNDER THE SKIN 1 TIME A WEEK   Probiotic Product (PROBIOTIC ACIDOPHILUS BEADS PO) Take 1 capsule by mouth daily.   Vitamin D, Cholecalciferol, 25 MCG (1000 UT) TABS Take 1,000 Units by mouth daily.   [DISCONTINUED] JARDIANCE  25 MG TABS tablet TAKE 1 TABLET(25 MG) BY MOUTH DAILY   [DISCONTINUED] pioglitazone -metformin  (ACTOPLUS MET ) 15-850 MG tablet Take 1 tablet by mouth 3 (three) times daily.   [DISCONTINUED] ramipril  (ALTACE ) 10 MG capsule TAKE 1 CAPSULE BY MOUTH EVERY MORNING   [DISCONTINUED] rosuvastatin  (CRESTOR ) 20 MG tablet Take 1 tablet (20 mg total) by mouth daily.   [DISCONTINUED] tiZANidine  (ZANAFLEX ) 2 MG tablet Take 1 tablet (2 mg total) by mouth daily.   acetaminophen  (TYLENOL ) 500 MG tablet Take 1,500 mg by mouth every 6 (six) hours as needed for moderate pain or mild pain. (Patient not taking: Reported on 03/09/2024)   empagliflozin  (JARDIANCE ) 25 MG TABS tablet Take 25 mg by mouth daily. (Patient not taking: Reported on 03/09/2024)   mupirocin  ointment (BACTROBAN ) 2 % Apply 1 Application topically daily. (Patient not taking: Reported on 03/09/2024)   No facility-administered medications prior to visit.    Review of Systems  Constitutional:  Negative for weight loss.  HENT: Negative.    Eyes: Negative.   Respiratory: Negative.    Cardiovascular:  Negative.   Gastrointestinal: Negative.   Genitourinary: Negative.   Skin: Negative.   Neurological: Negative.   Endo/Heme/Allergies: Negative.        Objective:   BP 114/69   Pulse 76   Temp 98.9 F (37.2 C)   Ht 5' 8 (1.727 m)   Wt 271 lb (122.9 kg)   SpO2 96%   BMI 41.21 kg/m   Vitals:   03/09/24 1504  BP: 114/69  Pulse: 76  Temp: 98.9 F (37.2 C)  Height: 5' 8 (1.727 m)  Weight: 271 lb (122.9 kg)  SpO2: 96%  BMI (Calculated): 41.22    Physical Exam Vitals reviewed.  Constitutional:      Appearance: Normal appearance. He is obese.  HENT:  Head: Normocephalic.     Left Ear: There is no impacted cerumen.     Nose: Nose normal.     Mouth/Throat:     Mouth: Mucous membranes are moist.     Pharynx: No posterior oropharyngeal erythema.   Eyes:     Extraocular Movements: Extraocular movements intact.     Pupils: Pupils are equal, round, and reactive to light.    Cardiovascular:     Rate and Rhythm: Regular rhythm.     Chest Wall: PMI is not displaced.     Pulses: Normal pulses.     Heart sounds: Normal heart sounds. No murmur heard. Pulmonary:     Effort: Pulmonary effort is normal.     Breath sounds: Normal air entry. No rhonchi or rales.  Abdominal:     General: Abdomen is flat. Bowel sounds are normal. There is no distension.     Palpations: Abdomen is soft. There is no hepatomegaly, splenomegaly or mass.     Tenderness: There is no abdominal tenderness.   Musculoskeletal:        General: Normal range of motion.     Cervical back: Normal range of motion and neck supple.     Right lower leg: No edema.     Left lower leg: No edema.   Skin:    General: Skin is warm and dry.     Comments: Lipoma on left upper back   Neurological:     General: No focal deficit present.     Mental Status: He is alert and oriented to person, place, and time.     Cranial Nerves: No cranial nerve deficit.     Motor: No weakness.   Psychiatric:        Mood  and Affect: Mood normal.        Behavior: Behavior normal.      Results for orders placed or performed in visit on 03/09/24  POCT CBG (Fasting - Glucose)  Result Value Ref Range   Glucose Fasting, POC 100 (A) 70 - 99 mg/dL    Recent Results (from the past 2160 hours)  POCT CBG (Fasting - Glucose)     Status: Abnormal   Collection Time: 01/20/24  1:14 PM  Result Value Ref Range   Glucose Fasting, POC 119 (A) 70 - 99 mg/dL  Lipid panel     Status: None   Collection Time: 03/02/24 10:11 AM  Result Value Ref Range   Cholesterol, Total 185 100 - 199 mg/dL   Triglycerides 161 0 - 149 mg/dL   HDL 73 >09 mg/dL   VLDL Cholesterol Cal 24 5 - 40 mg/dL   LDL Chol Calc (NIH) 88 0 - 99 mg/dL   Chol/HDL Ratio 2.5 0.0 - 5.0 ratio    Comment:                                   T. Chol/HDL Ratio                                             Men  Women                               1/2 Avg.Risk  3.4    3.3  Avg.Risk  5.0    4.4                                2X Avg.Risk  9.6    7.1                                3X Avg.Risk 23.4   11.0   Hemoglobin A1c     Status: Abnormal   Collection Time: 03/02/24 10:11 AM  Result Value Ref Range   Hgb A1c MFr Bld 6.5 (H) 4.8 - 5.6 %    Comment:          Prediabetes: 5.7 - 6.4          Diabetes: >6.4          Glycemic control for adults with diabetes: <7.0    Est. average glucose Bld gHb Est-mCnc 140 mg/dL  POCT CBG (Fasting - Glucose)     Status: Abnormal   Collection Time: 03/09/24  3:11 PM  Result Value Ref Range   Glucose Fasting, POC 100 (A) 70 - 99 mg/dL      Assessment & Plan:  As per problem list  Problem List Items Addressed This Visit       Cardiovascular and Mediastinum   Essential hypertension   Relevant Medications   cholestyramine (QUESTRAN) 4 g packet   ramipril  (ALTACE ) 10 MG capsule   rosuvastatin  (CRESTOR ) 20 MG tablet     Digestive   Chronic diarrhea   Relevant Medications    cholestyramine (QUESTRAN) 4 g packet     Endocrine   Diabetes mellitus (HCC) - Primary   Relevant Medications   ramipril  (ALTACE ) 10 MG capsule   empagliflozin  (JARDIANCE ) 25 MG TABS tablet   pioglitazone -metformin  (ACTOPLUS MET ) 15-850 MG tablet   rosuvastatin  (CRESTOR ) 20 MG tablet   Other Relevant Orders   POCT CBG (Fasting - Glucose) (Completed)   POC CREATINE & ALBUMIN,URINE     Other   Hyperlipidemia   Relevant Medications   cholestyramine (QUESTRAN) 4 g packet   ramipril  (ALTACE ) 10 MG capsule   rosuvastatin  (CRESTOR ) 20 MG tablet    Return in about 3 months (around 06/09/2024) for fu with labs prior.   Total time spent: 20 minutes  Arzella Bitters, MD  03/09/2024   This document may have been prepared by [facility] Voice Recognition software and as such may include unintentional dictation errors.

## 2024-03-16 ENCOUNTER — Other Ambulatory Visit: Payer: Self-pay

## 2024-03-16 MED ORDER — KERENDIA 10 MG PO TABS
10.0000 mg | ORAL_TABLET | Freq: Every day | ORAL | 0 refills | Status: AC
Start: 2024-03-16 — End: 2024-04-15

## 2024-03-16 NOTE — Progress Notes (Signed)
 Rx has been sent, will forward this to the front for appt to be moved up and I will make sure lab order has been placed

## 2024-04-19 ENCOUNTER — Ambulatory Visit (INDEPENDENT_AMBULATORY_CARE_PROVIDER_SITE_OTHER): Admitting: Internal Medicine

## 2024-04-19 VITALS — BP 124/78 | HR 67 | Temp 100.0°F | Ht 68.0 in | Wt 268.6 lb

## 2024-04-19 DIAGNOSIS — E119 Type 2 diabetes mellitus without complications: Secondary | ICD-10-CM

## 2024-04-19 DIAGNOSIS — I10 Essential (primary) hypertension: Secondary | ICD-10-CM | POA: Diagnosis not present

## 2024-04-19 DIAGNOSIS — K529 Noninfective gastroenteritis and colitis, unspecified: Secondary | ICD-10-CM

## 2024-04-19 DIAGNOSIS — E782 Mixed hyperlipidemia: Secondary | ICD-10-CM | POA: Diagnosis not present

## 2024-04-19 DIAGNOSIS — Z1152 Encounter for screening for COVID-19: Secondary | ICD-10-CM | POA: Diagnosis not present

## 2024-04-19 DIAGNOSIS — E139 Other specified diabetes mellitus without complications: Secondary | ICD-10-CM

## 2024-04-19 DIAGNOSIS — K580 Irritable bowel syndrome with diarrhea: Secondary | ICD-10-CM | POA: Diagnosis not present

## 2024-04-19 DIAGNOSIS — E7800 Pure hypercholesterolemia, unspecified: Secondary | ICD-10-CM

## 2024-04-19 LAB — POCT CBG (FASTING - GLUCOSE)-MANUAL ENTRY: Glucose Fasting, POC: 124 mg/dL — AB (ref 70–99)

## 2024-04-19 MED ORDER — ACCU-CHEK SOFTCLIX LANCETS MISC: 12 refills | Status: AC

## 2024-04-19 MED ORDER — ACCU-CHEK FASTCLIX LANCET KIT: 1.0000 | PACK | Freq: Every day | 0 refills | Status: AC

## 2024-04-19 MED ORDER — ACCU-CHEK GUIDE W/DEVICE KIT: 1.0000 | PACK | Freq: Every day | 0 refills | Status: AC

## 2024-04-19 MED ORDER — ACCU-CHEK GUIDE TEST VI STRP
ORAL_STRIP | 12 refills | Status: DC
Start: 2024-04-19 — End: 2024-04-20

## 2024-04-19 NOTE — Progress Notes (Signed)
 Established Patient Office Visit  Subjective:  Patient ID: Aaron Valdez, male    DOB: 1946-09-01  Age: 78 y.o. MRN: [id number]  Chief Complaint  Patient presents with   Follow-up    1 month follow up. Has a fever, woke up Friday night with diarrhea     Episode of diarrhea after eating out on Friday and notable for a fever today. Denies any cough, SOB, myalgia or lethargic. Chronic diarrhea has considerably improved with Questran .    No other concerns at this time.   Past Medical History:  Diagnosis Date   Aortic aneurysm (HCC)    possible   Arthritis    ALL OVER   Chronic kidney disease    RENAL INSUFF   Coronary artery disease    Diabetes mellitus (HCC) 10/14/2016   Dysplastic nevus 10/31/2022   Left upper arm. Severe atypia, peripheral margin involved. Excision 11/27/22   Dysplastic nevus 10/31/2022   Right flank. Moderate atypia with scar and persistent nevus like changes. Margins free.   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 Superficial spreading. Breslow'Drinda Belgard 0.65mm, Clark'Audy Dauphine level III. 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 Excised 08/06/2022, margins free.   Neuropathy    Obesity 10/14/2016    Past Surgical History:  Procedure Laterality Date   CATARACT EXTRACTION W/PHACO Right 05/20/2017   Procedure: CATARACT EXTRACTION PHACO AND INTRAOCULAR LENS PLACEMENT (IOC);  Surgeon: Jaye Fallow, MD;  Location: [facility];  Service: Ophthalmology;  Laterality: Right;  US  01:03 AP% 19.9 CDE 12.53 Fluid pack lot # [id number]   CATARACT EXTRACTION W/PHACO Left 09/01/2018   Procedure: CATARACT EXTRACTION PHACO AND INTRAOCULAR LENS PLACEMENT (IOC);  Surgeon: Jaye Fallow, MD;  Location: [facility];  Service: Ophthalmology;  Laterality: Left;  US  01:22.3 CDE  13.36 Fluid pack Lot # [id number]   COLONOSCOPY N/A 11/22/2020   Procedure: COLONOSCOPY;  Surgeon: Toledo, Ladell POUR, MD;  Location: [facility];  Service: Gastroenterology;  Laterality: N/A;   COLONOSCOPY WITH PROPOFOL  N/A 10/17/2016   Procedure: COLONOSCOPY WITH PROPOFOL ;  Surgeon: Rogelia Copping, MD;  Location: [facility];  Service: Endoscopy;  Laterality: N/A;   CORONARY ANGIOPLASTY     FRACTURE SURGERY Left    elbow   HIATAL HERNIA REPAIR     JOINT REPLACEMENT     NASAL SINUS SURGERY     POLYPECTOMY  10/17/2016   Procedure: POLYPECTOMY;  Surgeon: Rogelia Copping, MD;  Location: [facility];  Service: Endoscopy;;   TONSILLECTOMY     TOTAL KNEE ARTHROPLASTY Right 12/18/2020   Procedure: TOTAL KNEE ARTHROPLASTY;  Surgeon: Leora Lynwood SAUNDERS, MD;  Location: [facility];  Service: Orthopedics;  Laterality: Right;   UMBILICAL HERNIA REPAIR      Social History   Socioeconomic History   Marital status: Married    Spouse name: Not on file   Number of children: Not on file   Years of education: Not on file   Highest education level: Not on file  Occupational History   Not on file  Tobacco Use   Smoking status: Former    Types: Cigarettes   Smokeless tobacco: Former  Advertising account planner   Vaping status: Never Used  Substance and Sexual Activity   Alcohol use: No   Drug use:  No   Sexual activity: Not on file  Other Topics Concern   Not on file  Social History Narrative   Not on file   Social Drivers of Health   Financial Resource Strain: Not on file  Food Insecurity: Not on file  Transportation Needs: Not on file  Physical Activity: Not on file  Stress: Not on file  Social Connections: Not on file  Intimate Partner Violence: Not on file    Family History  Problem Relation Age of Onset   Heart disease Father    AAA (abdominal aortic aneurysm) Father    Cerebral aneurysm Brother    Stomach cancer Other     Allergies  Allergen Reactions   Tape Rash    Outpatient  Medications Prior to Visit  Medication Sig   aspirin  81 MG chewable tablet Chew 1 tablet (81 mg total) by mouth 2 (two) times daily.   Carboxymethylcellulose Sodium (LUBRICANT EYE DROPS OP) Place 2 drops into both eyes daily as needed (for dry eyes).   cholestyramine  (QUESTRAN ) 4 g packet Take 1 packet by mouth 2 (two) times daily.   empagliflozin  (JARDIANCE ) 25 MG TABS tablet Take 25 mg by mouth daily.   empagliflozin  (JARDIANCE ) 25 MG TABS tablet Take 1 tablet (25 mg total) by mouth daily.   hydrocortisone  (ANUSOL -HC) 2.5 % rectal cream Place rectally 3 (three) times daily.   Multiple Vitamin (MULTIVITAMIN) tablet Take 1 tablet by mouth daily.   OZEMPIC , 1 MG/DOSE, 4 MG/3ML SOPN INJECT 1 MG UNDER THE SKIN 1 TIME A WEEK   pioglitazone -metformin  (ACTOPLUS MET ) 15-850 MG tablet Take 1 tablet by mouth 3 (three) times daily.   Probiotic Product (PROBIOTIC ACIDOPHILUS BEADS PO) Take 1 capsule by mouth daily.   ramipril  (ALTACE ) 10 MG capsule Take 1 capsule (10 mg total) by mouth every morning.   rosuvastatin  (CRESTOR ) 20 MG tablet Take 1 tablet (20 mg total) by mouth daily.   tiZANidine  (ZANAFLEX ) 2 MG tablet Take 1 tablet (2 mg total) by mouth daily.   Vitamin D, Cholecalciferol, 25 MCG (1000 UT) TABS Take 1,000 Units by mouth daily.   [DISCONTINUED] glucose blood (ONETOUCH ULTRA TEST) test strip Test daily   acetaminophen  (TYLENOL ) 500 MG tablet Take 1,500 mg by mouth every 6 (six) hours as needed for moderate pain or mild pain. (Patient not taking: Reported on 04/19/2024)   loperamide (IMODIUM) 2 MG capsule Take 2 mg by mouth as needed for diarrhea or loose stools. (Patient not taking: Reported on 04/19/2024)   mupirocin  ointment (BACTROBAN ) 2 % Apply 1 Application topically daily. (Patient not taking: Reported on 04/19/2024)   No facility-administered medications prior to visit.    Review of Systems  Constitutional:  Negative for weight loss.  HENT: Negative.    Eyes: Negative.    Respiratory: Negative.    Cardiovascular: Negative.   Gastrointestinal: Negative.   Genitourinary: Negative.   Skin: Negative.   Neurological: Negative.   Endo/Heme/Allergies: Negative.        Objective:   BP 124/78   Pulse 67   Temp 100 F (37.8 C)   Ht 5' 8 (1.727 m)   Wt 268 lb 9.6 oz (121.8 kg)   SpO2 94%   BMI 40.84 kg/m   Vitals:   04/19/24 1421  BP: 124/78  Pulse: 67  Temp: 100 F (37.8 C)  Height: 5' 8 (1.727 m)  Weight: 268 lb 9.6 oz (121.8 kg)  SpO2: 94%  BMI (Calculated): 40.85    Physical Exam Vitals  reviewed.  Constitutional:      Appearance: Normal appearance. He is obese.  HENT:     Head: Normocephalic.     Left Ear: There is no impacted cerumen.     Nose: Nose normal.     Mouth/Throat:     Mouth: Mucous membranes are moist.     Pharynx: No posterior oropharyngeal erythema.  Eyes:     Extraocular Movements: Extraocular movements intact.     Pupils: Pupils are equal, round, and reactive to light.  Cardiovascular:     Rate and Rhythm: Regular rhythm.     Chest Wall: PMI is not displaced.     Pulses: Normal pulses.     Heart sounds: Normal heart sounds. No murmur heard. Pulmonary:     Effort: Pulmonary effort is normal.     Breath sounds: Normal air entry. No rhonchi or rales.  Abdominal:     General: Abdomen is flat. Bowel sounds are normal. There is no distension.     Palpations: Abdomen is soft. There is no hepatomegaly, splenomegaly or mass.     Tenderness: There is no abdominal tenderness.  Musculoskeletal:        General: Normal range of motion.     Cervical back: Normal range of motion and neck supple.     Right lower leg: No edema.     Left lower leg: No edema.  Skin:    General: Skin is warm and dry.     Comments: Lipoma on left upper back  Neurological:     General: No focal deficit present.     Mental Status: He is alert and oriented to person, place, and time.     Cranial Nerves: No cranial nerve deficit.     Motor:  No weakness.  Psychiatric:        Mood and Affect: Mood normal.        Behavior: Behavior normal.      Results for orders placed or performed in visit on 04/19/24  POCT CBG (Fasting - Glucose)  Result Value Ref Range   Glucose Fasting, POC 124 (A) 70 - 99 mg/dL    Recent Results (from the past 2160 hours)  Lipid panel     Status: None   Collection Time: 03/02/24 10:11 AM  Result Value Ref Range   Cholesterol, Total 185 100 - 199 mg/dL   Triglycerides 858 0 - 149 mg/dL   HDL 73 >60 mg/dL   VLDL Cholesterol Cal 24 5 - 40 mg/dL   LDL Chol Calc (NIH) 88 0 - 99 mg/dL   Chol/HDL Ratio 2.5 0.0 - 5.0 ratio    Comment:                                   T. Chol/HDL Ratio                                             Men  Women                               1/2 Avg.Risk  3.4    3.3  Avg.Risk  5.0    4.4                                2X Avg.Risk  9.6    7.1                                3X Avg.Risk 23.4   11.0   Hemoglobin A1c     Status: Abnormal   Collection Time: 03/02/24 10:11 AM  Result Value Ref Range   Hgb A1c MFr Bld 6.5 (H) 4.8 - 5.6 %    Comment:          Prediabetes: 5.7 - 6.4          Diabetes: >6.4          Glycemic control for adults with diabetes: <7.0    Est. average glucose Bld gHb Est-mCnc 140 mg/dL  POCT CBG (Fasting - Glucose)     Status: Abnormal   Collection Time: 03/09/24  3:11 PM  Result Value Ref Range   Glucose Fasting, POC 100 (A) 70 - 99 mg/dL  POC CREATINE & ALBUMIN,URINE     Status: Abnormal   Collection Time: 03/09/24  3:44 PM  Result Value Ref Range   Microalbumin Ur, POC 80 mg/L   Creatinine, POC 100 mg/dL   Albumin/Creatinine Ratio, Urine, POC 30-300   POCT CBG (Fasting - Glucose)     Status: Abnormal   Collection Time: 04/19/24  2:34 PM  Result Value Ref Range   Glucose Fasting, POC 124 (A) 70 - 99 mg/dL      Assessment & Plan:  As per problem list. COVID test at pharmacy. Problem List Items Addressed  This Visit       Cardiovascular and Mediastinum   Essential hypertension     Digestive   Chronic diarrhea     Endocrine   Diabetes mellitus (HCC)     Other   Hyperlipidemia   Other Visit Diagnoses       Diabetes 1.5, managed as type 2 (HCC)    -  Primary   Relevant Orders   POCT CBG (Fasting - Glucose) (Completed)     Gastroenteritis       Relevant Orders   GI pathogen panel by PCR, stool       Return in about 7 weeks (around 06/07/2024) for fu with labs prior.   Total time spent: 30 minutes  Sherrill Cinderella Perry, MD  04/19/2024   This document may have been prepared by [facility] Voice Recognition software and as such may include unintentional dictation errors.

## 2024-04-20 ENCOUNTER — Other Ambulatory Visit: Payer: Self-pay

## 2024-04-20 DIAGNOSIS — K529 Noninfective gastroenteritis and colitis, unspecified: Secondary | ICD-10-CM

## 2024-04-20 MED ORDER — ACCU-CHEK GUIDE TEST VI STRP: ORAL_STRIP | 12 refills | Status: AC

## 2024-04-23 ENCOUNTER — Telehealth: Payer: Self-pay

## 2024-04-23 DIAGNOSIS — K529 Noninfective gastroenteritis and colitis, unspecified: Secondary | ICD-10-CM | POA: Diagnosis not present

## 2024-04-23 NOTE — Telephone Encounter (Signed)
 Can you re put in the order for the patients stool samples through the lab, the order was put in through [facility]

## 2024-04-23 NOTE — Addendum Note (Signed)
 Addended by: BRYCE MARKER AHMAD on: 04/23/2024 02:42 PM   Modules accepted: Orders

## 2024-04-28 LAB — OVA AND PARASITE EXAMINATION

## 2024-05-03 ENCOUNTER — Ambulatory Visit: Payer: Self-pay | Admitting: Internal Medicine

## 2024-05-04 NOTE — Progress Notes (Signed)
 Patient notified

## 2024-05-06 DIAGNOSIS — H43813 Vitreous degeneration, bilateral: Secondary | ICD-10-CM | POA: Diagnosis not present

## 2024-05-06 DIAGNOSIS — E119 Type 2 diabetes mellitus without complications: Secondary | ICD-10-CM | POA: Diagnosis not present

## 2024-05-06 DIAGNOSIS — H26493 Other secondary cataract, bilateral: Secondary | ICD-10-CM | POA: Diagnosis not present

## 2024-05-06 DIAGNOSIS — H40003 Preglaucoma, unspecified, bilateral: Secondary | ICD-10-CM | POA: Diagnosis not present

## 2024-05-11 ENCOUNTER — Ambulatory Visit: Payer: Medicare Other | Admitting: Dermatology

## 2024-05-11 ENCOUNTER — Encounter: Payer: Self-pay | Admitting: Dermatology

## 2024-05-11 DIAGNOSIS — D1801 Hemangioma of skin and subcutaneous tissue: Secondary | ICD-10-CM | POA: Diagnosis not present

## 2024-05-11 DIAGNOSIS — L578 Other skin changes due to chronic exposure to nonionizing radiation: Secondary | ICD-10-CM | POA: Diagnosis not present

## 2024-05-11 DIAGNOSIS — W908XXA Exposure to other nonionizing radiation, initial encounter: Secondary | ICD-10-CM

## 2024-05-11 DIAGNOSIS — L814 Other melanin hyperpigmentation: Secondary | ICD-10-CM | POA: Diagnosis not present

## 2024-05-11 DIAGNOSIS — D239 Other benign neoplasm of skin, unspecified: Secondary | ICD-10-CM

## 2024-05-11 DIAGNOSIS — Z85820 Personal history of malignant melanoma of skin: Secondary | ICD-10-CM

## 2024-05-11 DIAGNOSIS — Z86006 Personal history of melanoma in-situ: Secondary | ICD-10-CM

## 2024-05-11 DIAGNOSIS — D229 Melanocytic nevi, unspecified: Secondary | ICD-10-CM

## 2024-05-11 DIAGNOSIS — L821 Other seborrheic keratosis: Secondary | ICD-10-CM

## 2024-05-11 DIAGNOSIS — D2330 Other benign neoplasm of skin of unspecified part of face: Secondary | ICD-10-CM

## 2024-05-11 DIAGNOSIS — Z1283 Encounter for screening for malignant neoplasm of skin: Secondary | ICD-10-CM | POA: Diagnosis not present

## 2024-05-11 NOTE — Patient Instructions (Addendum)
 Melanoma ABCDEs  Melanoma is the most dangerous type of skin cancer, and is the leading cause of death from skin disease.  You are more likely to develop melanoma if you: Have light-colored skin, light-colored eyes, or red or blond hair Spend a lot of time in the sun Tan regularly, either outdoors or in a tanning bed Have had blistering sunburns, especially during childhood Have a close family member who has had a melanoma Have atypical moles or large birthmarks  Early detection of melanoma is key since treatment is typically straightforward and cure rates are extremely high if we catch it early.   The first sign of melanoma is often a change in a mole or a new dark spot.  The ABCDE system is a way of remembering the signs of melanoma.  A for asymmetry:  The two halves do not match. B for border:  The edges of the growth are irregular. C for color:  A mixture of colors are present instead of an even brown color. D for diameter:  Melanomas are usually (but not always) greater than 6mm - the size of a pencil eraser. E for evolution:  The spot keeps changing in size, shape, and color.  Please check your skin once per month between visits. You can use a small mirror in front and a large mirror behind you to keep an eye on the back side or your body.   If you see any new or changing lesions before your next follow-up, please call to schedule a visit.  Please continue daily skin protection including broad spectrum sunscreen SPF 30+ to sun-exposed areas, reapplying every 2 hours as needed when you're outdoors.     Recommend daily broad spectrum sunscreen SPF 30+ to sun-exposed areas, reapply every 2 hours as needed. Call for new or changing lesions.  Staying in the shade or wearing long sleeves, sun glasses (UVA+UVB protection) and wide brim hats (4-inch brim around the entire circumference of the hat) are also recommended for sun protection.     Due to recent changes in healthcare laws, you  may see results of your pathology and/or laboratory studies on MyChart before the doctors have had a chance to review them. We understand that in some cases there may be results that are confusing or concerning to you. Please understand that not all results are received at the same time and often the doctors may need to interpret multiple results in order to provide you with the best plan of care or course of treatment. Therefore, we ask that you please give us  2 business days to thoroughly review all your results before contacting the office for clarification. Should we see a critical lab result, you will be contacted sooner.   If You Need Anything After Your Visit  If you have any questions or concerns for your doctor, please call our main line at [phone] and press option 4 to reach your doctor's medical assistant. If no one answers, please leave a voicemail as directed and we will return your call as soon as possible. Messages left after 4 pm will be answered the following business day.   You may also send us  a message via MyChart. We typically respond to MyChart messages within 1-2 business days.  For prescription refills, please ask your pharmacy to contact our office. Our fax number is [phone].  If you have an urgent issue when the clinic is closed that cannot wait until the next business day, you can page your doctor  at the number below.    Please note that while we do our best to be available for urgent issues outside of office hours, we are not available 24/7.   If you have an urgent issue and are unable to reach us , you may choose to seek medical care at your doctor's office, retail clinic, urgent care center, or emergency room.  If you have a medical emergency, please immediately call 911 or go to the emergency department.  Pager Numbers  - Dr. Hester: [phone]  - Dr. Jackquline: [phone]  - Dr. Claudene: [phone]   - Dr. Raymund: [phone]  In the event of  inclement weather, please call our main line at [phone] for an update on the status of any delays or closures.  Dermatology Medication Tips: Please keep the boxes that topical medications come in in order to help keep track of the instructions about where and how to use these. Pharmacies typically print the medication instructions only on the boxes and not directly on the medication tubes.   If your medication is too expensive, please contact our office at [phone] option 4 or send us  a message through MyChart.   We are unable to tell what your co-pay for medications will be in advance as this is different depending on your insurance coverage. However, we may be able to find a substitute medication at lower cost or fill out paperwork to get insurance to cover a needed medication.   If a prior authorization is required to get your medication covered by your insurance company, please allow us  1-2 business days to complete this process.  Drug prices often vary depending on where the prescription is filled and some pharmacies may offer cheaper prices.  The website www.goodrx.com contains coupons for medications through different pharmacies. The prices here do not account for what the cost may be with help from insurance (it may be cheaper with your insurance), but the website can give you the price if you did not use any insurance.  - You can print the associated coupon and take it with your prescription to the pharmacy.  - You may also stop by our office during regular business hours and pick up a GoodRx coupon card.  - If you need your prescription sent electronically to a different pharmacy, notify our office through [facility] or by phone at [phone] option 4.     Si Usted Necesita Algo Despus de Su Visita  Tambin puede enviarnos un mensaje a travs de Clinical cytogeneticist. Por lo general respondemos a los mensajes de MyChart en el transcurso de 1 a 2 das hbiles.  Para renovar  recetas, por favor pida a su farmacia que se ponga en contacto con nuestra oficina. Randi lakes de fax es [place] [phone].  Si tiene un asunto urgente cuando la clnica est cerrada y que no puede esperar hasta el siguiente da hbil, puede llamar/localizar a su doctor(a) al nmero que aparece a continuacin.   Por favor, tenga en cuenta que aunque hacemos todo lo posible para estar disponibles para asuntos urgentes fuera del horario de [place], no estamos disponibles las 24 horas del da, los 7 [address]  [address] de la [place].   Si tiene un problema urgente y no puede comunicarse con nosotros, puede optar por buscar atencin mdica  en el consultorio de su doctor(a), en una clnica privada, en un centro de atencin urgente o en una sala de emergencias.  Si tiene una emergencia mdica, por favor llame inmediatamente al 911 o vaya  a la sala de Sports administrator.  Nmeros de bper  - Dr. Hester: [phone]  - Dra. Jackquline: [phone]  - Dr. Claudene: [phone]  - Dra. Kitts: [phone]  En caso de inclemencias del [place], por favor llame a nuestra lnea principal al [phone] para una actualizacin sobre el estado de cualquier retraso o cierre.  Consejos para la medicacin en dermatologa: Por favor, guarde las cajas en las que vienen los medicamentos de uso tpico para ayudarle a seguir las instrucciones sobre dnde y cmo usarlos. Las farmacias generalmente imprimen las instrucciones del medicamento slo en las cajas y no directamente en los tubos del [place].   Si su medicamento es muy caro, por favor, pngase en contacto con landry rieger llamando al [phone] y presione la opcin 4 o envenos un mensaje a travs de Clinical cytogeneticist.   No podemos decirle cul ser su copago por los medicamentos por adelantado ya que esto es diferente dependiendo de la cobertura de su seguro. Sin embargo, es posible que podamos encontrar un medicamento sustituto a Audiological scientist un formulario para que el  seguro cubra el medicamento que se considera necesario.   Si se requiere una autorizacin previa para que su compaa de seguros malta su medicamento, por favor permtanos de 1 a 2 das hbiles para completar este proceso.  Los precios de los medicamentos varan con frecuencia dependiendo del Environmental consultant de dnde se surte la receta y alguna farmacias pueden ofrecer precios ms baratos.  El sitio web www.goodrx.com tiene cupones para medicamentos de Health and safety inspector. Los precios aqu no tienen en cuenta lo que podra costar con la ayuda del seguro (puede ser ms barato con su seguro), pero el sitio web puede darle el precio si no utiliz Tourist information centre manager.  - Puede imprimir el cupn correspondiente y llevarlo con su receta a la farmacia.  - Tambin puede pasar por nuestra oficina durante el horario de atencin regular y Education officer, museum una tarjeta de cupones de GoodRx.  - Si necesita que su receta se enve electrnicamente a una farmacia diferente, informe a nuestra oficina a travs de MyChart de [place] o por telfono llamando al [phone] y presione la opcin 4.

## 2024-05-11 NOTE — Progress Notes (Signed)
 Follow-Up Visit   Subjective  Aaron Valdez is a 78 y.o. male who presents for the following: Skin Cancer Screening and Full Body Skin Exam; Hx MM and MMIS. No areas of concern at this time.   The patient presents for Total-Body Skin Exam (TBSE) for skin cancer screening and mole check. The patient has spots, moles and lesions to be evaluated, some may be new or changing and the patient may have concern these could be cancer.    The following portions of the chart were reviewed this encounter and updated as appropriate: medications, allergies, medical history  Review of Systems:  No other skin or systemic complaints except as noted in HPI or Assessment and Plan.  Objective  Well appearing patient in no apparent distress; mood and affect are within normal limits.  A full examination was performed including scalp, head, eyes, ears, nose, lips, neck, chest, axillae, abdomen, back, buttocks, bilateral upper extremities, bilateral lower extremities, hands, feet, fingers, toes, fingernails, and toenails. All findings within normal limits unless otherwise noted below.   Relevant physical exam findings are noted in the Assessment and Plan.    Assessment & Plan   SKIN CANCER SCREENING PERFORMED TODAY.  ACTINIC DAMAGE - Chronic condition, secondary to cumulative UV/sun exposure - diffuse scaly erythematous macules with underlying dyspigmentation - Recommend daily broad spectrum sunscreen SPF 30+ to sun-exposed areas, reapply every 2 hours as needed.  - Staying in the shade or wearing long sleeves, sun glasses (UVA+UVB protection) and wide brim hats (4-inch brim around the entire circumference of the hat) are also recommended for sun protection.  - Call for new or changing lesions. - offered 6 m skin check vs 1 year. Patient prefers 1 year  LENTIGINES, SEBORRHEIC KERATOSES, HEMANGIOMAS - Benign normal skin lesions - Benign-appearing - Call for any changes  MELANOCYTIC NEVI -  Tan-brown and/or pink-flesh-colored symmetric macules and papules - Benign appearing on exam today - Observation - Call clinic for new or changing moles - Recommend daily use of broad spectrum spf 30+ sunscreen to sun-exposed areas.   HISTORY OF MELANOMA - Right forearm. Superficial spreading. Breslow's 0.3mm,   Clark's level III. Excised 08/06/2022, margins free  - No evidence of recurrence today - No lymphadenopathy weight loss intractable headaches - Recommend regular full body skin exams - Recommend daily broad spectrum sunscreen SPF 30+ to sun-exposed areas, reapply every 2 hours as needed.  - Call if any new or changing lesions are noted between office visits   HISTORY OF MELANOMA IN SITU - Right calf. Excised 08/06/2022, margins free.  - No evidence of recurrence today - Recommend regular full body skin exams - Recommend daily broad spectrum sunscreen SPF 30+ to sun-exposed areas, reapply every 2 hours as needed.  - Call if any new or changing lesions are noted between office visits   DERMATOFIBROMA Exam: Firm pink/brown papulenodule with dimple sign. Treatment Plan: A dermatofibroma is a benign growth possibly related to trauma, such as an insect bite, cut from shaving, or inflamed acne-type bump.  Treatment options to remove include shave or excision with resulting scar and risk of recurrence.  Since benign-appearing and not bothersome, will observe for now.   Mild lymphadenopathy of the R neck -  Resolved at this time.    MULTIPLE BENIGN NEVI   LENTIGINES   ACTINIC ELASTOSIS   CHERRY ANGIOMA   SEBORRHEIC KERATOSES   DERMATOFIBROMA   Return in about 1 year (around 05/11/2025) for TBSE.  I, Emerick Ege, CMA  am acting as scribe for Boneta Sharps, MD.   Documentation: I have reviewed the above documentation for accuracy and completeness, and I agree with the above.  Boneta Sharps, MD

## 2024-05-12 ENCOUNTER — Encounter: Payer: Self-pay | Admitting: Internal Medicine

## 2024-05-13 DIAGNOSIS — I10 Essential (primary) hypertension: Secondary | ICD-10-CM | POA: Diagnosis not present

## 2024-05-13 DIAGNOSIS — N2581 Secondary hyperparathyroidism of renal origin: Secondary | ICD-10-CM | POA: Diagnosis not present

## 2024-05-13 DIAGNOSIS — E876 Hypokalemia: Secondary | ICD-10-CM | POA: Diagnosis not present

## 2024-05-13 DIAGNOSIS — N183 Chronic kidney disease, stage 3 unspecified: Secondary | ICD-10-CM | POA: Diagnosis not present

## 2024-05-13 DIAGNOSIS — E1129 Type 2 diabetes mellitus with other diabetic kidney complication: Secondary | ICD-10-CM | POA: Diagnosis not present

## 2024-05-18 ENCOUNTER — Other Ambulatory Visit: Payer: Self-pay | Admitting: Internal Medicine

## 2024-05-18 DIAGNOSIS — E119 Type 2 diabetes mellitus without complications: Secondary | ICD-10-CM

## 2024-05-20 DIAGNOSIS — E1129 Type 2 diabetes mellitus with other diabetic kidney complication: Secondary | ICD-10-CM | POA: Diagnosis not present

## 2024-05-20 DIAGNOSIS — N2581 Secondary hyperparathyroidism of renal origin: Secondary | ICD-10-CM | POA: Diagnosis not present

## 2024-05-20 DIAGNOSIS — E876 Hypokalemia: Secondary | ICD-10-CM | POA: Diagnosis not present

## 2024-05-20 DIAGNOSIS — I10 Essential (primary) hypertension: Secondary | ICD-10-CM | POA: Diagnosis not present

## 2024-05-20 DIAGNOSIS — N183 Chronic kidney disease, stage 3 unspecified: Secondary | ICD-10-CM | POA: Diagnosis not present

## 2024-05-25 ENCOUNTER — Other Ambulatory Visit: Payer: Self-pay | Admitting: Internal Medicine

## 2024-05-25 DIAGNOSIS — K645 Perianal venous thrombosis: Secondary | ICD-10-CM

## 2024-05-25 DIAGNOSIS — E119 Type 2 diabetes mellitus without complications: Secondary | ICD-10-CM

## 2024-06-15 ENCOUNTER — Ambulatory Visit: Admitting: Internal Medicine

## 2024-06-21 ENCOUNTER — Telehealth: Payer: Self-pay

## 2024-06-21 NOTE — Telephone Encounter (Signed)
 Pt LM stating someone called him but didn't leave a message as to why they were calling, looks like he's got an appt coming up on 10/6, please call pt back to confirm he doesn't need to change his appt

## 2024-06-24 ENCOUNTER — Other Ambulatory Visit

## 2024-06-24 DIAGNOSIS — E782 Mixed hyperlipidemia: Secondary | ICD-10-CM | POA: Diagnosis not present

## 2024-06-24 LAB — HEMOGLOBIN A1C
Est. average glucose Bld gHb Est-mCnc: 134 mg/dL
Hgb A1c MFr Bld: 6.3 % — ABNORMAL HIGH (ref 4.8–5.6)

## 2024-06-25 LAB — LIPID PANEL
Chol/HDL Ratio: 2.1 ratio (ref 0.0–5.0)
Cholesterol, Total: 161 mg/dL (ref 100–199)
HDL: 75 mg/dL (ref >39)
LDL Chol Calc (NIH): 70 mg/dL (ref 0–99)
Triglycerides: 89 mg/dL (ref 0–149)
VLDL Cholesterol Cal: 16 mg/dL (ref 5–40)

## 2024-06-28 ENCOUNTER — Ambulatory Visit: Payer: Self-pay | Admitting: Internal Medicine

## 2024-06-28 ENCOUNTER — Ambulatory Visit: Admitting: Internal Medicine

## 2024-06-28 VITALS — BP 138/62 | HR 78 | Temp 98.3°F | Ht 68.0 in | Wt 266.0 lb

## 2024-06-28 DIAGNOSIS — Z23 Encounter for immunization: Secondary | ICD-10-CM

## 2024-06-28 DIAGNOSIS — Z0130 Encounter for examination of blood pressure without abnormal findings: Secondary | ICD-10-CM

## 2024-06-28 DIAGNOSIS — E119 Type 2 diabetes mellitus without complications: Secondary | ICD-10-CM

## 2024-06-28 DIAGNOSIS — E7800 Pure hypercholesterolemia, unspecified: Secondary | ICD-10-CM

## 2024-06-28 LAB — POCT CBG (FASTING - GLUCOSE)-MANUAL ENTRY: Glucose Fasting, POC: 161 mg/dL — AB (ref 70–99)

## 2024-06-28 MED ORDER — ROSUVASTATIN CALCIUM 20 MG PO TABS: 20.0000 mg | ORAL_TABLET | Freq: Every day | ORAL | 1 refills | Status: AC

## 2024-06-28 MED ORDER — PIOGLITAZONE HCL-METFORMIN HCL 15-850 MG PO TABS: 1.0000 | ORAL_TABLET | Freq: Three times a day (TID) | ORAL | 0 refills | Status: DC

## 2024-06-28 NOTE — Progress Notes (Signed)
 Established Patient Office Visit  Subjective:  Patient ID: Aaron Valdez, male    DOB: August 24, 1946  Age: 78 y.o. MRN: [id number]  No chief complaint on file.   No new complaints, here for lab review and medication refills. Labs reviewed and notable for well controlled diabetes, A1c improved and at target, lipids at target with unremarkable cmp. Denies any hypoglycemic episodes and home bg readings have been at target.     No other concerns at this time.   Past Medical History:  Diagnosis Date   Aortic aneurysm    possible   Arthritis    ALL OVER   Chronic kidney disease    RENAL INSUFF   Coronary artery disease    Diabetes mellitus (HCC) 10/14/2016   Dysplastic nevus 10/31/2022   Left upper arm. Severe atypia, peripheral margin involved. Excision 11/27/22   Dysplastic nevus 10/31/2022   Right flank. Moderate atypia with scar and persistent nevus like changes. Margins free.   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 Superficial spreading. Breslow'Alazar Cherian 0.74mm, Clark'Tomia Enlow level III. 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 Excised 08/06/2022, margins free.   Neuropathy    Obesity 10/14/2016    Past Surgical History:  Procedure Laterality Date   CATARACT EXTRACTION W/PHACO Right 05/20/2017   Procedure: CATARACT EXTRACTION PHACO AND INTRAOCULAR LENS PLACEMENT (IOC);  Surgeon: Jaye Fallow, MD;  Location: [facility];  Service: Ophthalmology;  Laterality: Right;  US  01:03 AP% 19.9 CDE 12.53 Fluid pack lot # [id number]   CATARACT EXTRACTION W/PHACO Left 09/01/2018   Procedure: CATARACT EXTRACTION PHACO AND INTRAOCULAR LENS PLACEMENT (IOC);  Surgeon: Jaye Fallow, MD;  Location: [facility];  Service: Ophthalmology;  Laterality: Left;  US  01:22.3 CDE 13.36 Fluid pack Lot #  [id number]   COLONOSCOPY N/A 11/22/2020   Procedure: COLONOSCOPY;  Surgeon: Toledo, Ladell POUR, MD;  Location: [facility];  Service: Gastroenterology;  Laterality: N/A;   COLONOSCOPY WITH PROPOFOL  N/A 10/17/2016   Procedure: COLONOSCOPY WITH PROPOFOL ;  Surgeon: Rogelia Copping, MD;  Location: [facility];  Service: Endoscopy;  Laterality: N/A;   CORONARY ANGIOPLASTY     FRACTURE SURGERY Left    elbow   HIATAL HERNIA REPAIR     JOINT REPLACEMENT     NASAL SINUS SURGERY     POLYPECTOMY  10/17/2016   Procedure: POLYPECTOMY;  Surgeon: Rogelia Copping, MD;  Location: [facility];  Service: Endoscopy;;   TONSILLECTOMY     TOTAL KNEE ARTHROPLASTY Right 12/18/2020   Procedure: TOTAL KNEE ARTHROPLASTY;  Surgeon: Leora Lynwood SAUNDERS, MD;  Location: [facility];  Service: Orthopedics;  Laterality: Right;   UMBILICAL HERNIA REPAIR      Social History   Socioeconomic History   Marital status: Married    Spouse name: Not on file   Number of children: Not on file   Years of education: Not on file   Highest education level: Not on file  Occupational History   Not on file  Tobacco Use   Smoking status: Former    Types: Cigarettes   Smokeless tobacco: Former  Advertising account planner   Vaping status: Never Used  Substance and Sexual Activity   Alcohol use: No   Drug use: No   Sexual activity: Not on file  Other Topics Concern   Not on file  Social History Narrative   Not on file   Social Drivers of Health   Financial Resource Strain: Not on file  Food Insecurity: Not on file  Transportation Needs: Not on file  Physical Activity: Not on file  Stress: Not on file  Social Connections: Not on file  Intimate Partner Violence: Not on file    Family History  Problem Relation Age of Onset   Heart disease Father    AAA (abdominal aortic aneurysm) Father    Cerebral aneurysm Brother    Stomach cancer Other     Allergies  Allergen Reactions   Tape Rash    Outpatient Medications Prior to Visit   Medication Sig   Accu-Chek Softclix Lancets lancets Use as instructed   acetaminophen  (TYLENOL ) 500 MG tablet Take 1,500 mg by mouth every 6 (six) hours as needed for moderate pain or mild pain. (Patient not taking: Reported on 04/19/2024)   aspirin  81 MG chewable tablet Chew 1 tablet (81 mg total) by mouth 2 (two) times daily.   Carboxymethylcellulose Sodium (LUBRICANT EYE DROPS OP) Place 2 drops into both eyes daily as needed (for dry eyes).   cholestyramine  (QUESTRAN ) 4 g packet Take 1 packet by mouth 2 (two) times daily.   empagliflozin  (JARDIANCE ) 25 MG TABS tablet Take 25 mg by mouth daily.   empagliflozin  (JARDIANCE ) 25 MG TABS tablet TAKE 1 TABLET(25 MG) BY MOUTH DAILY   glucose blood (ACCU-CHEK GUIDE TEST) test strip Use with device to check sugar three time daily   hydrocortisone  (ANUSOL -HC) 2.5 % rectal cream APPLY RECTALLY TO THE AFFECTED AREA THREE TIMES DAILY   loperamide (IMODIUM) 2 MG capsule Take 2 mg by mouth as needed for diarrhea or loose stools. (Patient not taking: Reported on 04/19/2024)   Multiple Vitamin (MULTIVITAMIN) tablet Take 1 tablet by mouth daily.   mupirocin  ointment (BACTROBAN ) 2 % Apply 1 Application topically daily. (Patient not taking: Reported on 04/19/2024)   OZEMPIC , 1 MG/DOSE, 4 MG/3ML SOPN INJECT 1 MG UNDER THE SKIN ONCE A WEEK   pioglitazone -metformin  (ACTOPLUS MET ) 15-850 MG tablet Take 1 tablet by mouth 3 (three) times daily.   Probiotic Product (PROBIOTIC ACIDOPHILUS BEADS PO) Take 1 capsule by mouth daily.   ramipril  (ALTACE ) 10 MG capsule Take 1 capsule (10 mg total) by mouth every morning.   rosuvastatin  (CRESTOR ) 20 MG tablet Take 1 tablet (20 mg total) by mouth daily.   tiZANidine  (ZANAFLEX ) 2 MG tablet Take 1 tablet (2 mg total) by mouth daily.   Vitamin D, Cholecalciferol, 25 MCG (1000 UT) TABS Take 1,000 Units by mouth daily.   No facility-administered medications prior to visit.    Review of Systems  Constitutional:  Positive for weight  loss (2 lbs).  HENT: Negative.    Eyes: Negative.   Respiratory: Negative.    Cardiovascular: Negative.   Gastrointestinal: Negative.   Genitourinary: Negative.   Skin: Negative.   Neurological: Negative.   Endo/Heme/Allergies: Negative.        Objective:   BP 138/62   Pulse 78   Temp 98.3 F (36.8 C) (Tympanic)   Ht 5' 8 (1.727 m)   Wt 266 lb (120.7 kg)   SpO2 95%   BMI 40.45 kg/m   Vitals:   06/28/24 1322  BP: 138/62  Pulse: 78  Temp: 98.3 F (36.8 C)  Height: 5' 8 (1.727 m)  Weight: 266 lb (120.7 kg)  SpO2: 95%  TempSrc: Tympanic  BMI (  Calculated): 40.45    Physical Exam Vitals reviewed.  Constitutional:      Appearance: Normal appearance. He is obese.  HENT:     Head: Normocephalic.     Left Ear: There is no impacted cerumen.     Nose: Nose normal.     Mouth/Throat:     Mouth: Mucous membranes are moist.     Pharynx: No posterior oropharyngeal erythema.  Eyes:     Extraocular Movements: Extraocular movements intact.     Pupils: Pupils are equal, round, and reactive to light.  Cardiovascular:     Rate and Rhythm: Regular rhythm.     Chest Wall: PMI is not displaced.     Pulses: Normal pulses.     Heart sounds: Normal heart sounds. No murmur heard. Pulmonary:     Effort: Pulmonary effort is normal.     Breath sounds: Normal air entry. No rhonchi or rales.  Abdominal:     General: Abdomen is flat. Bowel sounds are normal. There is no distension.     Palpations: Abdomen is soft. There is no hepatomegaly, splenomegaly or mass.     Tenderness: There is no abdominal tenderness.  Musculoskeletal:        General: Normal range of motion.     Cervical back: Normal range of motion and neck supple.     Right lower leg: No edema.     Left lower leg: No edema.  Skin:    General: Skin is warm and dry.     Comments: Lipoma on left upper back  Neurological:     General: No focal deficit present.     Mental Status: He is alert and oriented to person,  place, and time.     Cranial Nerves: No cranial nerve deficit.     Motor: No weakness.  Psychiatric:        Mood and Affect: Mood normal.        Behavior: Behavior normal.      Results for orders placed or performed in visit on 06/28/24  POCT CBG (Fasting - Glucose)  Result Value Ref Range   Glucose Fasting, POC 161 (A) 70 - 99 mg/dL    Recent Results (from the past 2160 hours)  POCT CBG (Fasting - Glucose)     Status: Abnormal   Collection Time: 04/19/24  2:34 PM  Result Value Ref Range   Glucose Fasting, POC 124 (A) 70 - 99 mg/dL  Ova and parasite examination     Status: None   Collection Time: 04/23/24  3:38 PM   Specimen: Stool   ST  Result Value Ref Range   OVA + PARASITE EXAM Final report     Comment: These results were obtained using wet preparation(Eulon Allnutt) and trichrome stained smear. This test does not include testing for Cryptosporidium parvum, Cyclospora, or Microsporidia.    O&P result 1 Comment     Comment: No ova, cysts, or parasites seen. One negative specimen does not rule out the possibility of a parasitic infection.   Lipid panel     Status: None   Collection Time: 06/24/24 10:07 AM  Result Value Ref Range   Cholesterol, Total 161 100 - 199 mg/dL   Triglycerides 89 0 - 149 mg/dL   HDL 75 >60 mg/dL   VLDL Cholesterol Cal 16 5 - 40 mg/dL   LDL Chol Calc (NIH) 70 0 - 99 mg/dL   Chol/HDL Ratio 2.1 0.0 - 5.0 ratio    Comment:  T. Chol/HDL Ratio                                             Men  Women                               1/2 Avg.Risk  3.4    3.3                                   Avg.Risk  5.0    4.4                                2X Avg.Risk  9.6    7.1                                3X Avg.Risk 23.4   11.0   Hemoglobin A1c     Status: Abnormal   Collection Time: 06/24/24 10:08 AM  Result Value Ref Range   Hgb A1c MFr Bld 6.3 (H) 4.8 - 5.6 %    Comment:          Prediabetes: 5.7 - 6.4          Diabetes: >6.4           Glycemic control for adults with diabetes: <7.0    Est. average glucose Bld gHb Est-mCnc 134 mg/dL  POCT CBG (Fasting - Glucose)     Status: Abnormal   Collection Time: 06/28/24  1:27 PM  Result Value Ref Range   Glucose Fasting, POC 161 (A) 70 - 99 mg/dL      Assessment & Plan:   Problem List Items Addressed This Visit       Endocrine   Diabetes mellitus (HCC) - Primary   Relevant Orders   POCT CBG (Fasting - Glucose) (Completed)    No follow-ups on file.   Total time spent: 20 minutes Problem List Items Addressed This Visit       Endocrine   Diabetes mellitus (HCC) - Primary   Relevant Medications   rosuvastatin  (CRESTOR ) 20 MG tablet   pioglitazone -metformin  (ACTOPLUS MET ) 15-850 MG tablet   Other Relevant Orders   POCT CBG (Fasting - Glucose) (Completed)     Other   Hyperlipidemia   Relevant Medications   rosuvastatin  (CRESTOR ) 20 MG tablet   Other Visit Diagnoses       Needs flu shot       Relevant Orders   Flu Vaccine Trivalent High Dose (Fluad)       Sherrill Cinderella Perry, MD  06/28/2024   This document may have been prepared by [facility] Voice Recognition software and as such may include unintentional dictation errors.

## 2024-07-20 ENCOUNTER — Other Ambulatory Visit: Payer: Self-pay | Admitting: Internal Medicine

## 2024-07-20 DIAGNOSIS — E119 Type 2 diabetes mellitus without complications: Secondary | ICD-10-CM

## 2024-08-09 ENCOUNTER — Other Ambulatory Visit: Payer: Self-pay | Admitting: Internal Medicine

## 2024-08-21 ENCOUNTER — Other Ambulatory Visit: Payer: Self-pay | Admitting: Internal Medicine

## 2024-08-21 DIAGNOSIS — E119 Type 2 diabetes mellitus without complications: Secondary | ICD-10-CM

## 2024-09-20 ENCOUNTER — Other Ambulatory Visit: Payer: Self-pay | Admitting: Internal Medicine

## 2024-09-20 DIAGNOSIS — I10 Essential (primary) hypertension: Secondary | ICD-10-CM

## 2024-09-29 ENCOUNTER — Other Ambulatory Visit

## 2024-09-29 NOTE — Addendum Note (Signed)
 Addended by: BRYCE MARKER AHMAD on: 09/29/2024 10:02 AM   Modules accepted: Orders

## 2024-09-30 LAB — LIPID PANEL
Chol/HDL Ratio: 1.9 ratio (ref 0.0–5.0)
Cholesterol, Total: 169 mg/dL (ref 100–199)
HDL: 91 mg/dL
LDL Chol Calc (NIH): 63 mg/dL (ref 0–99)
Triglycerides: 85 mg/dL (ref 0–149)
VLDL Cholesterol Cal: 15 mg/dL (ref 5–40)

## 2024-09-30 LAB — HEMOGLOBIN A1C
Est. average glucose Bld gHb Est-mCnc: 146 mg/dL
Hgb A1c MFr Bld: 6.7 % — ABNORMAL HIGH (ref 4.8–5.6)

## 2024-10-04 ENCOUNTER — Ambulatory Visit: Payer: Self-pay | Admitting: Internal Medicine

## 2024-10-04 ENCOUNTER — Ambulatory Visit (INDEPENDENT_AMBULATORY_CARE_PROVIDER_SITE_OTHER): Admitting: Internal Medicine

## 2024-10-04 ENCOUNTER — Encounter: Payer: Self-pay | Admitting: Internal Medicine

## 2024-10-04 VITALS — BP 122/72 | HR 78 | Temp 99.7°F | Ht 67.0 in | Wt 264.4 lb

## 2024-10-04 DIAGNOSIS — E1165 Type 2 diabetes mellitus with hyperglycemia: Secondary | ICD-10-CM | POA: Diagnosis not present

## 2024-10-04 DIAGNOSIS — I10 Essential (primary) hypertension: Secondary | ICD-10-CM

## 2024-10-04 DIAGNOSIS — E039 Hypothyroidism, unspecified: Secondary | ICD-10-CM

## 2024-10-04 DIAGNOSIS — E119 Type 2 diabetes mellitus without complications: Secondary | ICD-10-CM

## 2024-10-04 DIAGNOSIS — E782 Mixed hyperlipidemia: Secondary | ICD-10-CM

## 2024-10-04 LAB — POCT CBG (FASTING - GLUCOSE)-MANUAL ENTRY: Glucose Fasting, POC: 132 mg/dL — AB (ref 70–99)

## 2024-10-04 MED ORDER — OZEMPIC (1 MG/DOSE) 4 MG/3ML ~~LOC~~ SOPN: 1.0000 mg | PEN_INJECTOR | SUBCUTANEOUS | 2 refills | Status: AC

## 2024-10-04 NOTE — Progress Notes (Signed)
 "  Established Patient Office Visit  Subjective:  Patient ID: Aaron Valdez, male    DOB: 02-18-1946  Age: 79 y.o. MRN: [id number]  Chief Complaint  Patient presents with   Follow-up    3 month follow up    No new complaints, here for lab review and medication refills. Labs reviewed and notable for well controlled diabetes, A1c higher but still at target, lipids at target also. Denies any hypoglycemic episodes and home bg readings have been at target.     No other concerns at this time.   Past Medical History:  Diagnosis Date   Aortic aneurysm    possible   Arthritis    ALL OVER   Chronic kidney disease    RENAL INSUFF   Coronary artery disease    Diabetes mellitus (HCC) 10/14/2016   Dysplastic nevus 10/31/2022   Left upper arm. Severe atypia, peripheral margin involved. Excision 11/27/22   Dysplastic nevus 10/31/2022   Right flank. Moderate atypia with scar and persistent nevus like changes. Margins free.   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 Superficial spreading. Breslow'Shanica Castellanos 0.48mm, Clark'Riven Mabile level III. 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 Excised 08/06/2022, margins free.   Neuropathy    Obesity 10/14/2016    Past Surgical History:  Procedure Laterality Date   CATARACT EXTRACTION W/PHACO Right 05/20/2017   Procedure: CATARACT EXTRACTION PHACO AND INTRAOCULAR LENS PLACEMENT (IOC);  Surgeon: Jaye Fallow, MD;  Location: [facility];  Service: Ophthalmology;  Laterality: Right;  US  01:03 AP% 19.9 CDE 12.53 Fluid pack lot # [id number]   CATARACT EXTRACTION W/PHACO Left 09/01/2018   Procedure: CATARACT EXTRACTION PHACO AND INTRAOCULAR LENS PLACEMENT (IOC);  Surgeon: Jaye Fallow, MD;  Location: [facility];  Service: Ophthalmology;  Laterality: Left;  US   01:22.3 CDE 13.36 Fluid pack Lot # [id number]   COLONOSCOPY N/A 11/22/2020   Procedure: COLONOSCOPY;  Surgeon: Toledo, Ladell POUR, MD;  Location: [facility];  Service: Gastroenterology;  Laterality: N/A;   COLONOSCOPY WITH PROPOFOL  N/A 10/17/2016   Procedure: COLONOSCOPY WITH PROPOFOL ;  Surgeon: Rogelia Copping, MD;  Location: [facility];  Service: Endoscopy;  Laterality: N/A;   CORONARY ANGIOPLASTY     FRACTURE SURGERY Left    elbow   HIATAL HERNIA REPAIR     JOINT REPLACEMENT     NASAL SINUS SURGERY     POLYPECTOMY  10/17/2016   Procedure: POLYPECTOMY;  Surgeon: Rogelia Copping, MD;  Location: [facility];  Service: Endoscopy;;   TONSILLECTOMY     TOTAL KNEE ARTHROPLASTY Right 12/18/2020   Procedure: TOTAL KNEE ARTHROPLASTY;  Surgeon: Leora Lynwood SAUNDERS, MD;  Location: [facility];  Service: Orthopedics;  Laterality: Right;   UMBILICAL HERNIA REPAIR      Social History   Socioeconomic History   Marital status: Married    Spouse name: Not on file   Number of children: Not on file   Years of education: Not on file   Highest education level: Not on file  Occupational History   Not on file  Tobacco Use   Smoking status: Former    Types: Cigarettes   Smokeless tobacco: Former  Advertising Account Planner   Vaping status: Never Used  Substance and Sexual Activity   Alcohol use: No  Drug use: No   Sexual activity: Not on file  Other Topics Concern   Not on file  Social History Narrative   Not on file   Social Drivers of Health   Tobacco Use: Medium Risk (10/04/2024)   Patient History    Smoking Tobacco Use: Former    Smokeless Tobacco Use: Former    Passive Exposure: Not on Stage Manager: Not on [employer] Insecurity: Not on file  Transportation Needs: Not on file  Physical Activity: Not on file  Stress: Not on file  Social Connections: Not on file  Intimate Partner Violence: Not on file  Depression (PHQ2-9): Low Risk (12/11/2023)   Depression (PHQ2-9)    PHQ-2  Score: 2  Alcohol Screen: Not on file  Housing: Not on file  Utilities: Not on file  Health Literacy: Not on file    Family History  Problem Relation Age of Onset   Heart disease Father    AAA (abdominal aortic aneurysm) Father    Cerebral aneurysm Brother    Stomach cancer Other     Allergies[1]  Show/hide medication list[2]  Review of Systems  Constitutional:  Positive for weight loss (2 lbs).  HENT: Negative.    Eyes: Negative.   Respiratory: Negative.    Cardiovascular: Negative.   Gastrointestinal: Negative.   Genitourinary: Negative.   Skin: Negative.   Neurological: Negative.   Endo/Heme/Allergies: Negative.        Objective:   BP 122/72   Pulse 78   Temp 99.7 F (37.6 C)   Ht 5' 7 (1.702 m)   Wt 264 lb 6.4 oz (119.9 kg)   SpO2 93%   BMI 41.41 kg/m   Vitals:   10/04/24 1304  BP: 122/72  Pulse: 78  Temp: 99.7 F (37.6 C)  Height: 5' 7 (1.702 m)  Weight: 264 lb 6.4 oz (119.9 kg)  SpO2: 93%  BMI (Calculated): 41.4    Physical Exam Vitals reviewed.  Constitutional:      Appearance: Normal appearance. He is obese.  HENT:     Head: Normocephalic.     Left Ear: There is no impacted cerumen.     Nose: Nose normal.     Mouth/Throat:     Mouth: Mucous membranes are moist.     Pharynx: No posterior oropharyngeal erythema.  Eyes:     Extraocular Movements: Extraocular movements intact.     Pupils: Pupils are equal, round, and reactive to light.  Cardiovascular:     Rate and Rhythm: Regular rhythm.     Chest Wall: PMI is not displaced.     Pulses: Normal pulses.     Heart sounds: Normal heart sounds. No murmur heard. Pulmonary:     Effort: Pulmonary effort is normal.     Breath sounds: Normal air entry. No rhonchi or rales.  Abdominal:     General: Abdomen is flat. Bowel sounds are normal. There is no distension.     Palpations: Abdomen is soft. There is no hepatomegaly, splenomegaly or mass.     Tenderness: There is no abdominal  tenderness.  Musculoskeletal:        General: Normal range of motion.     Cervical back: Normal range of motion and neck supple.     Right lower leg: No edema.     Left lower leg: No edema.  Skin:    General: Skin is warm and dry.     Comments: Lipoma on left upper back  Neurological:  General: No focal deficit present.     Mental Status: He is alert and oriented to person, place, and time.     Cranial Nerves: No cranial nerve deficit.     Motor: No weakness.  Psychiatric:        Mood and Affect: Mood normal.        Behavior: Behavior normal.      Results for orders placed or performed in visit on 10/04/24  POCT CBG (Fasting - Glucose)  Result Value Ref Range   Glucose Fasting, POC 132 (A) 70 - 99 mg/dL    Recent Results (from the past 2160 hours)  Lipid panel     Status: None   Collection Time: 09/29/24  9:54 AM  Result Value Ref Range   Cholesterol, Total 169 100 - 199 mg/dL   Triglycerides 85 0 - 149 mg/dL   HDL 91 >60 mg/dL   VLDL Cholesterol Cal 15 5 - 40 mg/dL   LDL Chol Calc (NIH) 63 0 - 99 mg/dL   Chol/HDL Ratio 1.9 0.0 - 5.0 ratio    Comment:                                   T. Chol/HDL Ratio                                             Men  Women                               1/2 Avg.Risk  3.4    3.3                                   Avg.Risk  5.0    4.4                                2X Avg.Risk  9.6    7.1                                3X Avg.Risk 23.4   11.0   Hemoglobin A1c     Status: Abnormal   Collection Time: 09/29/24  9:55 AM  Result Value Ref Range   Hgb A1c MFr Bld 6.7 (H) 4.8 - 5.6 %    Comment:          Prediabetes: 5.7 - 6.4          Diabetes: >6.4          Glycemic control for adults with diabetes: <7.0    Est. average glucose Bld gHb Est-mCnc 146 mg/dL  POCT CBG (Fasting - Glucose)     Status: Abnormal   Collection Time: 10/04/24  1:17 PM  Result Value Ref Range   Glucose Fasting, POC 132 (A) 70 - 99 mg/dL      Assessment &  Plan:  Aaron Valdez was seen today for follow-up.  Type 2 diabetes mellitus without complication, without long-term current use of insulin (HCC) -     POCT CBG (Fasting - Glucose) -     Ozempic  (1  MG/DOSE); Inject 1 mg into the skin once a week.  Dispense: 3 mL; Refill: 2 -     Lipid panel -     Hemoglobin A1c  Essential hypertension  Mixed hyperlipidemia -     CBC With Diff/Platelet -     Comprehensive metabolic panel with GFR  Acquired hypothyroidism -     TSH    Problem List Items Addressed This Visit       Cardiovascular and Mediastinum   Essential hypertension     Endocrine   Diabetes mellitus (HCC) - Primary   Relevant Medications   OZEMPIC , 1 MG/DOSE, 4 MG/3ML SOPN   Other Relevant Orders   POCT CBG (Fasting - Glucose) (Completed)   Lipid panel   Hemoglobin A1c   Acquired hypothyroidism   Relevant Orders   TSH     Other   Hyperlipidemia   Relevant Orders   CBC With Diff/Platelet   Comprehensive metabolic panel with GFR    Return in about 3 months (around 01/02/2025) for awv with labs prior.   Total time spent: 20 minutes. This time includes review of previous notes and results and patient face to face interaction during today'Telesa Jeancharles visit.    Sherrill Cinderella Perry, MD  10/04/2024   This document may have been prepared by [facility] Voice Recognition software and as such may include unintentional dictation errors.     [1]  Allergies Allergen Reactions   Tape Rash  [2]  Outpatient Medications Prior to Visit  Medication Sig   Accu-Chek Softclix Lancets lancets Use as instructed   aspirin  81 MG chewable tablet Chew 1 tablet (81 mg total) by mouth 2 (two) times daily.   Carboxymethylcellulose Sodium (LUBRICANT EYE DROPS OP) Place 2 drops into both eyes daily as needed (for dry eyes).   cholestyramine  (QUESTRAN ) 4 g packet Take 1 packet by mouth 2 (two) times daily.   empagliflozin  (JARDIANCE ) 25 MG TABS tablet Take 25 mg by mouth daily.   empagliflozin   (JARDIANCE ) 25 MG TABS tablet TAKE 1 TABLET(25 MG) BY MOUTH DAILY   glucose blood (ACCU-CHEK GUIDE TEST) test strip Use with device to check sugar three time daily   hydrocortisone  (ANUSOL -HC) 2.5 % rectal cream APPLY RECTALLY TO THE AFFECTED AREA THREE TIMES DAILY   Multiple Vitamin (MULTIVITAMIN) tablet Take 1 tablet by mouth daily.   pioglitazone -metformin  (ACTOPLUS MET ) 15-850 MG tablet TAKE 1 TABLET BY MOUTH THREE TIMES DAILY   Probiotic Product (PROBIOTIC ACIDOPHILUS BEADS PO) Take 1 capsule by mouth daily.   ramipril  (ALTACE ) 10 MG capsule TAKE 1 CAPSULE(10 MG) BY MOUTH EVERY MORNING   rosuvastatin  (CRESTOR ) 20 MG tablet Take 1 tablet (20 mg total) by mouth daily.   tiZANidine  (ZANAFLEX ) 2 MG tablet TAKE 1 TABLET(2 MG) BY MOUTH DAILY   Vitamin D, Cholecalciferol, 25 MCG (1000 UT) TABS Take 1,000 Units by mouth daily.   [DISCONTINUED] acetaminophen  (TYLENOL ) 500 MG tablet Take 1,500 mg by mouth every 6 (six) hours as needed for moderate pain or mild pain.   [DISCONTINUED] OZEMPIC , 1 MG/DOSE, 4 MG/3ML SOPN INJECT 1 MG UNDER THE SKIN ONCE A WEEK   Lactobacillus (PROBIOTIC ACIDOPHILUS) CAPS Take 1 capsule by mouth.   loperamide (IMODIUM) 2 MG capsule Take 2 mg by mouth as needed for diarrhea or loose stools. (Patient not taking: Reported on 10/04/2024)   mupirocin  ointment (BACTROBAN ) 2 % Apply 1 Application topically daily. (Patient not taking: Reported on 10/04/2024)   No facility-administered medications prior to visit.   "

## 2025-01-10 ENCOUNTER — Ambulatory Visit: Admitting: Internal Medicine

## 2025-05-12 ENCOUNTER — Ambulatory Visit: Admitting: Dermatology
